# Patient Record
Sex: Male | Born: 1955
Health system: Southern US, Community
[De-identification: ages and names within clinical notes are randomized; demographics above are authoritative.]

## PROBLEM LIST (undated history)

## (undated) DIAGNOSIS — F1910 Other psychoactive substance abuse, uncomplicated: Secondary | ICD-10-CM

## (undated) DIAGNOSIS — B1920 Unspecified viral hepatitis C without hepatic coma: Secondary | ICD-10-CM

## (undated) DIAGNOSIS — K429 Umbilical hernia without obstruction or gangrene: Secondary | ICD-10-CM

## (undated) DIAGNOSIS — K7460 Unspecified cirrhosis of liver: Secondary | ICD-10-CM

## (undated) DIAGNOSIS — T783XXA Angioneurotic edema, initial encounter: Secondary | ICD-10-CM

## (undated) DIAGNOSIS — R188 Other ascites: Secondary | ICD-10-CM

## (undated) DIAGNOSIS — K766 Portal hypertension: Secondary | ICD-10-CM

## (undated) DIAGNOSIS — K4090 Unilateral inguinal hernia, without obstruction or gangrene, not specified as recurrent: Secondary | ICD-10-CM

## (undated) DIAGNOSIS — R772 Abnormality of alphafetoprotein: Secondary | ICD-10-CM

## (undated) HISTORY — DX: Other psychoactive substance abuse, uncomplicated: F19.10

## (undated) HISTORY — DX: Umbilical hernia without obstruction or gangrene: K42.9

## (undated) HISTORY — DX: Portal hypertension: K76.6

## (undated) HISTORY — DX: Unspecified viral hepatitis C without hepatic coma: B19.20

## (undated) HISTORY — DX: Abnormality of alphafetoprotein: R77.2

## (undated) HISTORY — DX: Angioneurotic edema, initial encounter: T78.3XXA

## (undated) HISTORY — DX: Unilateral inguinal hernia, without obstruction or gangrene, not specified as recurrent: K40.90

## (undated) HISTORY — PX: NO PAST SURGERIES: SHX2092

## (undated) HISTORY — DX: Unspecified cirrhosis of liver: K74.60

## (undated) HISTORY — DX: Other ascites: R18.8

---

## 2015-03-12 ENCOUNTER — Encounter (HOSPITAL_COMMUNITY): Payer: Self-pay | Admitting: *Deleted

## 2015-03-12 ENCOUNTER — Emergency Department (HOSPITAL_COMMUNITY)
Admission: EM | Admit: 2015-03-12 | Discharge: 2015-03-12 | Disposition: A | Discharge status: Home / Self Care | Payer: Self-pay | Attending: Emergency Medicine | Admitting: Emergency Medicine

## 2015-03-12 DIAGNOSIS — I776 Arteritis, unspecified: Secondary | ICD-10-CM | POA: Insufficient documentation

## 2015-03-12 LAB — CBC WITH DIFFERENTIAL/PLATELET
Basophils Absolute: 0 10*3/uL (ref 0.0–0.1)
Basophils Relative: 0 %
Eosinophils Absolute: 0 10*3/uL (ref 0.0–0.7)
Eosinophils Relative: 1 %
HCT: 46.7 % (ref 39.0–52.0)
Hemoglobin: 16.4 g/dL (ref 13.0–17.0)
Lymphocytes Relative: 27 %
Lymphs Abs: 2.1 10*3/uL (ref 0.7–4.0)
MCH: 32.2 pg (ref 26.0–34.0)
MCHC: 35.1 g/dL (ref 30.0–36.0)
MCV: 91.6 fL (ref 78.0–100.0)
Monocytes Absolute: 0.6 10*3/uL (ref 0.1–1.0)
Monocytes Relative: 8 %
Neutro Abs: 5.1 10*3/uL (ref 1.7–7.7)
Neutrophils Relative %: 65 %
Platelets: 118 10*3/uL — ABNORMAL LOW (ref 150–400)
RBC: 5.1 MIL/uL (ref 4.22–5.81)
RDW: 13.4 % (ref 11.5–15.5)
WBC: 7.8 10*3/uL (ref 4.0–10.5)

## 2015-03-12 LAB — BASIC METABOLIC PANEL
Anion gap: 13 (ref 5–15)
BUN: 13 mg/dL (ref 6–20)
CALCIUM: 9.6 mg/dL (ref 8.9–10.3)
CO2: 25 mmol/L (ref 22–32)
CREATININE: 1.24 mg/dL (ref 0.61–1.24)
Chloride: 101 mmol/L (ref 101–111)
Glucose, Bld: 120 mg/dL — ABNORMAL HIGH (ref 65–99)
Potassium: 4.7 mmol/L (ref 3.5–5.1)
Sodium: 139 mmol/L (ref 135–145)

## 2015-03-12 LAB — SEDIMENTATION RATE: Sed Rate: 4 mm/hr (ref 0–16)

## 2015-03-12 MED ORDER — PREDNISONE 20 MG PO TABS: 60.0000 mg | ORAL_TABLET | Freq: Every day | ORAL | Status: DC

## 2015-03-12 MED ORDER — PREDNISONE 20 MG PO TABS
60.0000 mg | ORAL_TABLET | Freq: Once | ORAL | Status: AC
  Administered 2015-03-12: 60 mg via ORAL
  Filled 2015-03-12: qty 3

## 2015-03-12 NOTE — ED Notes (Signed)
The pt has ha d arash  From his waist down especially in his groin area  He has had 2 days with itching

## 2015-03-12 NOTE — ED Notes (Signed)
Pt verbalized understanding of d/c instructions and has no further questions. Pt stable and NAD. Pt to follow up with pcp within 3 days. Pt verbalized importance of using prednisone prescription.

## 2015-03-12 NOTE — Discharge Instructions (Signed)
[name],  Take prednisone daily for treatment and see a primary care doctor within 3 days for close follow up.  If any symptoms worsen, come back to the ED immediately. Thank you. Vasculitis is swelling (inflammation) of the blood vessels. With vasculitis, the blood vessels can become thick, narrow, scarred, or weak, and enough blood may not be able to flow through them. This can cause damage to the muscles, kidneys, lungs, brain, and other parts of the body.  There are many types of vasculitis. Some last only a short time while others last a long time. CAUSES  The exact cause is unknown, but vasculitis can develop when the body's immune system attacks its own blood vessels. This attack can be caused by:  An infection.  An immune system disease, such as lupus, rheumatoid arthritis, or scleroderma.  An allergic reaction to a medicine.  Cancer that affects blood cells, such as leukemia and lymphoma. RISK FACTORS  Being a smoker.  Being under stress.  Having a physical injury. SIGNS AND SYMPTOMS  Symptoms vary depending on the type of vasculitis you have. Symptoms that are common to all types of vasculitis include:  Fever.  Poor appetite.  Weight loss.  Feeling very tired.  Having aches and pains.  Weakness.  Numbness in an area of your body. Symptoms for specific types of vasculitis include:  Skin problems, such as sores, spots, or rashes.  Trouble seeing.  Trouble breathing.  Blood in your urine.  Headaches.  Stomach pain.  Stuffy or bloody nose. DIAGNOSIS  Your health care provider will ask about your symptoms and do a physical exam. You may have tests done, such as:  A complete blood count (CBC).  Erythrocyte sedimentation, also called sed rate test.  C-reactive protein (CRP).  Antineutrophil cytoplasmic antibodies (ANCA).  A urine test.  A biopsy of a blood vessel.  A nerve conduction study.  Imaging tests, such as:  X-rays.  A  CT scan.  An ultrasound.  An MRI.  Angiography. TREATMENT  Treatment will depend on the type of vasculitis you have and how severe it is. Sometimes treatment is not needed. Treatment often includes:  Medicines.  Physical therapy or occupational therapy. This helps strengthen muscles that were weakened by the disease. You will need to see your health care provider while you are being treated. During follow-up visits, your health care provider will:  Perform blood tests and bone density tests.  Check your blood pressure and blood sugar.  Check for side effects of any medicines you are taking. Vasculitis cannot always be cured. Sometimes symptoms go away but the disease does not (the disease goes in remission). If symptoms return, increased treatment may be needed. HOME CARE INSTRUCTIONS   Take medicines only as directed by your health care provider.  Keep all follow-up visits as directed by your health care provider. This is important.  Exercise. Talk with your health care provider about what exercises are okay for you to do. Usually exercises that increase your heart rate (aerobic exercise), such as walking, are recommended. Aerobic exercise helps control your blood pressure and prevent bone loss.  Follow a healthy diet. Include healthy sources of protein, fruits, vegetables, and whole grains in your diet.  Learn as much as you can about vasculitis, and consider joining a support group. Understanding your condition and talking with others who have it may help you cope. Talk with your health care provider if you feel stressed, anxious, or depressed. [place]  IF:   Your symptoms return, or you have new symptoms.  Your fever, fatigue, headache, or weight loss gets worse.  You have signs of infection, such as fever, warmth, tenderness, redness, or swelling. SEEK IMMEDIATE MEDICAL CARE IF:   Your vision gets worse.  Your pain does not go away, even after you take pain  medicine.  You have chest or stomach pain.  You have trouble breathing.  One side of your face or body suddenly becomes weak or numb.  Your nose bleeds.  There is blood in your urine. MAKE SURE YOU:  Understand these instructions.  Will watch your condition.  Will get help right away if you are not doing well or get worse.   This information is not intended to replace advice given to you by your health care provider. Make sure you discuss any questions you have with your health care provider.   Document Released: 11/18/2008 Document Revised: 02/12/2014 Document Reviewed: 03/18/2013 Elsevier Interactive Patient Education [employer].

## 2015-03-12 NOTE — ED Provider Notes (Signed)
CSN: [id number]     Arrival date & time 03/12/15  0019 History  By signing my name below, I, Richard Yang, attest that this documentation has been prepared under the direction and in the presence of Everlene Balls, MD. Electronically Signed: Judithann Sauger, ED Scribe. 03/12/2015. 3:52 AM.    Chief Complaint  Patient presents with  . Rash   The history is provided by the patient. No language interpreter was used.   HPI Comments: Richard Yang is a 60 y.o. male who presents to the Emergency Department complaining of gradually spreading mildy itchy rash that started in his left groin and now to his bilateral elbows onset 3 days ago. He explains that the only thing out of the ordinary 3 days ago was drinking Sake. No alleviating factors noted. Pt has not taken any medications. He also denies any new medications, lotions, or detergents. No fever, chills, facial swelling, or throat/tongue swelling.   History reviewed. No pertinent past medical history. History reviewed. No pertinent past surgical history. No family history on file. Social History  Substance Use Topics  . Smoking status: Never Smoker   . Smokeless tobacco: None  . Alcohol Use: Yes    Review of Systems  Constitutional: Negative for fever and chills.  HENT: Negative for facial swelling and trouble swallowing.   Skin: Positive for rash.  All other systems reviewed and are negative.     Allergies  Review of patient's allergies indicates no known allergies.  Home Medications   Prior to Admission medications   Not on File   BP 154/103 mmHg  Pulse 101  Temp(Src) 99 F (37.2 C)  Resp 18  Ht 5\' 10"  (1.778 m)  Wt 223 lb 6 oz (101.322 kg)  BMI 32.05 kg/m2  SpO2 97% Physical Exam  Constitutional: He is oriented to person, place, and time. Vital signs are normal. He appears well-developed and well-nourished.  Non-toxic appearance. He does not appear ill. No distress.  HENT:  Head: Normocephalic and atraumatic.  Nose:  Nose normal.  Mouth/Throat: Oropharynx is clear and moist. No oropharyngeal exudate.  Eyes: Conjunctivae and EOM are normal. Pupils are equal, round, and reactive to light. No scleral icterus.  Neck: Normal range of motion. Neck supple. No tracheal deviation, no edema, no erythema and normal range of motion present. No thyroid mass and no thyromegaly present.  Cardiovascular: Normal rate, regular rhythm, S1 normal, S2 normal, normal heart sounds, intact distal pulses and normal pulses.  Exam reveals no gallop and no friction rub.   No murmur heard. Pulmonary/Chest: Effort normal and breath sounds normal. No respiratory distress. He has no wheezes. He has no rhonchi. He has no rales.  Abdominal: Soft. Normal appearance and bowel sounds are normal. He exhibits no distension, no ascites and no mass. There is no hepatosplenomegaly. There is no tenderness. There is no rebound, no guarding and no CVA tenderness.  Musculoskeletal: Normal range of motion. He exhibits no edema or tenderness.  Lymphadenopathy:    He has no cervical adenopathy.  Neurological: He is alert and oriented to person, place, and time. He has normal strength. No cranial nerve deficit or sensory deficit.  Skin: Skin is warm and intact. Rash noted. No petechiae noted. He is not diaphoretic. There is erythema. No pallor.  Erythematous, non-blanching, slightly warm rash to his groin bilateral thighs, and bilateral forearms: No TTP, no drainage  Psychiatric: He has a normal mood and affect. His behavior is normal. Judgment normal.  Nursing note and vitals  reviewed.   ED Course  Procedures (including critical care time) DIAGNOSTIC STUDIES: Oxygen Saturation is 97%, RA,  normal by my interpretation.    COORDINATION OF CARE:   3:17 AM- Pt advised of plan for treatment and pt agrees. Pt will receive prednisone and lab work for further evaluation.   Labs Review Labs Reviewed  CBC WITH DIFFERENTIAL/PLATELET - Abnormal; Notable for  the following:    Platelets 118 (*)    All other components within normal limits  BASIC METABOLIC PANEL - Abnormal; Notable for the following:    Glucose, Bld 120 (*)    All other components within normal limits  SEDIMENTATION RATE  C-REACTIVE PROTEIN     Everlene Balls, MD has personally reviewed and evaluated these lab results as part of his medical decision-making.  MDM   Final diagnoses:  None   Patient's rash is consistent with a vasculitis. He was given prednisone in the emergency department, will obtain laboratory studies to assess for any systemic findings. He continues to appear well and has no complaints of pain or pruritus.  Labs reveal thrombocytopenia.  He has no history of bleeding. He was given prednisone in the emergency department and will be discharged with a one-week course. Patient encouraged to follow with primary care physician within 3 days. He demonstrated good understanding. Strict return precautions given. He appears well and in no acute distress, vital signs were within his normal limits and he is safe for discharge.   I personally performed the services described in this documentation, which was scribed in my presence. The recorded information has been reviewed and is accurate.     Everlene Balls, MD 03/12/15 [phone]

## 2015-03-15 ENCOUNTER — Ambulatory Visit: Payer: Self-pay | Attending: Family Medicine | Admitting: Physician Assistant

## 2015-03-15 VITALS — BP 150/88 | HR 85 | Temp 98.3°F | Resp 16 | Ht 68.0 in | Wt 228.0 lb

## 2015-03-15 DIAGNOSIS — R21 Rash and other nonspecific skin eruption: Secondary | ICD-10-CM | POA: Insufficient documentation

## 2015-03-15 DIAGNOSIS — Z0001 Encounter for general adult medical examination with abnormal findings: Secondary | ICD-10-CM | POA: Insufficient documentation

## 2015-03-15 DIAGNOSIS — R030 Elevated blood-pressure reading, without diagnosis of hypertension: Secondary | ICD-10-CM | POA: Insufficient documentation

## 2015-03-15 DIAGNOSIS — I776 Arteritis, unspecified: Secondary | ICD-10-CM | POA: Insufficient documentation

## 2015-03-15 DIAGNOSIS — Z131 Encounter for screening for diabetes mellitus: Secondary | ICD-10-CM

## 2015-03-15 DIAGNOSIS — D696 Thrombocytopenia, unspecified: Secondary | ICD-10-CM | POA: Insufficient documentation

## 2015-03-15 LAB — POCT GLYCOSYLATED HEMOGLOBIN (HGB A1C): Hemoglobin A1C: 5.6

## 2015-03-15 LAB — GLUCOSE, POCT (MANUAL RESULT ENTRY): POC Glucose: 137 mg/dl — AB (ref 70–99)

## 2015-03-15 NOTE — Progress Notes (Signed)
Patient's here for ED f/up for vasculitis. Patient denies pain, but states he's dealing with a lot of itchiness.  Patient would like to talk with provider about Hep B vaccination to discuss details of preventative care.  Patient also here to est. care with PCP.  Patient agrees A1c screening, but decline flu shot.

## 2015-03-15 NOTE — Progress Notes (Addendum)
   [name]  [id number]  [id number]  DOB - Sep 05, 1955  Chief Complaint  Patient presents with  . Follow-up  . Establish Care       Subjective:   Richard Yang is a 60 y.o. male here today for establishment of care. He has no PMH other than remote polysubstance abuse. He presented to the Emergency Department on 03/12/15 complaining of a gradually spreading mildy itchy rash that started in his left groin and now to his bilateral elbows and bilateral groin, onset 3 days ago. He explains that the only thing out of the ordinary 3 days ago was drinking Sake. No alleviating factors noted. Pt has not taken any medications. He also denies any new medications, lotions, or detergents. No fever, chills, facial swelling, or throat/tongue swelling.  No recent virus. No fam hx rheumatologic conditions. No allergies. No bleeding. No bruising.  ROS: GEN: denies fever or chills, denies change in weight Skin: + lesions or rashes HEENT: denies headache, earache, epistaxis, sore throat, or neck pain LUNGS: denies SHOB, dyspnea, PND, orthopnea CV: denies CP or palpitations EXT: denies muscle spasms or swelling; no pain in lower ext, no weakness  Problem  Vasculitis (Hcc)    ALLERGIES: No Known Allergies  PAST MEDICAL HISTORY: History reviewed. No pertinent past medical history.  Polysubstance abuse remotely  PAST SURGICAL HISTORY: History reviewed. No pertinent past surgical history.  No operations  MEDICATIONS AT HOME: Prior to Admission medications   Medication Sig Start Date End Date Taking? Authorizing Provider  predniSONE (DELTASONE) 20 MG tablet Take 3 tablets (60 mg total) by mouth daily. 03/12/15   Everlene Balls, MD     Objective:   Filed Vitals:   03/15/15 1400  BP: 150/88  Pulse: 85  Temp: 98.3 F (36.8 C)  TempSrc: Oral  Resp: 16  Height: 5\' 8"  (1.727 m)  Weight: 228 lb (103.42 kg)  SpO2: 95%    Exam General appearance : Awake, alert, not in any distress. Speech  Clear. Not toxic looking. Neck: supple, no JVD. No cervical lymphadenopathy.  Chest:Good air entry bilaterally, no added sounds  CVS: S1 S2 regular, no murmurs.  Extremities: B/L Lower Ext shows no edema, both legs are warm to touch Skin:purplish macula papular rash of bilateral groin and lower leg L>R   Data Review Lab Results  Component Value Date   HGBA1C 5.60 03/15/2015     Assessment & Plan  1. Vasculitis  -Cont course of Prednisone  -Consider rheum referral   2. Thrombocytopenia  -repeat PLT count in 1 week   -Complete steroids  3. Elevated BP  -keep a log and bring to next appt  -DASH diet  Return in about 1 week (around 03/22/2015). At this time can consider repeating PLT count and the possible need for Rheum referral. He also would like to discuss hep B vaccination at this time.   The patient was given clear instructions to go to ER or return to medical center if symptoms don't improve, worsen or new problems develop. The patient verbalized understanding. The patient was told to call to get lab results if they haven't heard anything in the next week.   This note has been created with Surveyor, quantity. Any transcriptional errors are unintentional.    Zettie Pho, PA-C [facility] and [facility], [place]   03/15/2015, 2:33 PM

## 2015-03-23 ENCOUNTER — Ambulatory Visit: Payer: Self-pay | Attending: Internal Medicine | Admitting: Internal Medicine

## 2015-03-23 ENCOUNTER — Encounter: Payer: Self-pay | Admitting: Internal Medicine

## 2015-03-23 VITALS — BP 141/91 | HR 83 | Temp 98.0°F | Resp 16 | Ht 69.0 in | Wt 220.0 lb

## 2015-03-23 DIAGNOSIS — Z79899 Other long term (current) drug therapy: Secondary | ICD-10-CM | POA: Insufficient documentation

## 2015-03-23 DIAGNOSIS — I776 Arteritis, unspecified: Secondary | ICD-10-CM | POA: Insufficient documentation

## 2015-03-23 DIAGNOSIS — D696 Thrombocytopenia, unspecified: Secondary | ICD-10-CM | POA: Insufficient documentation

## 2015-03-23 LAB — CBC WITH DIFFERENTIAL/PLATELET
BASOS PCT: 0 % (ref 0–1)
Basophils Absolute: 0 10*3/uL (ref 0.0–0.1)
EOS ABS: 0.2 10*3/uL (ref 0.0–0.7)
Eosinophils Relative: 4 % (ref 0–5)
HCT: 48.7 % (ref 39.0–52.0)
HEMOGLOBIN: 16.4 g/dL (ref 13.0–17.0)
LYMPHS ABS: 2.4 10*3/uL (ref 0.7–4.0)
Lymphocytes Relative: 47 % — ABNORMAL HIGH (ref 12–46)
MCH: 30.8 pg (ref 26.0–34.0)
MCHC: 33.7 g/dL (ref 30.0–36.0)
MCV: 91.5 fL (ref 78.0–100.0)
MONO ABS: 1.1 10*3/uL — AB (ref 0.1–1.0)
MONOS PCT: 21 % — AB (ref 3–12)
MPV: 11.9 fL (ref 8.6–12.4)
NEUTROS ABS: 1.4 10*3/uL — AB (ref 1.7–7.7)
Neutrophils Relative %: 28 % — ABNORMAL LOW (ref 43–77)
Platelets: 109 10*3/uL — ABNORMAL LOW (ref 150–400)
RBC: 5.32 MIL/uL (ref 4.22–5.81)
RDW: 14.2 % (ref 11.5–15.5)
WBC: 5.1 10*3/uL (ref 4.0–10.5)

## 2015-03-23 NOTE — Progress Notes (Signed)
Patient ID: Richard Yang, male   DOB: 1955/02/09, 60 y.o.   MRN: [id number]  CC: follow up  HPI: Richard Yang is a 60 y.o. male here today for a follow up visit.  Patient has past medical history of vasculitis. Patient was evaluated in ED and here as a walk in for symptoms of vasculitis. He was given a course of steroids and told to follow up here today to establish care with a PCP. He has since completed steroids but still has complaints of mild itching on right inner thigh. He reports resolution of area with improvement of color.    No Known Allergies History reviewed. No pertinent past medical history. Current Outpatient Prescriptions on File Prior to Visit  Medication Sig Dispense Refill  . predniSONE (DELTASONE) 20 MG tablet Take 3 tablets (60 mg total) by mouth daily. 15 tablet 0   No current facility-administered medications on file prior to visit.   History reviewed. No pertinent family history. Social History   Social History  . Marital Status: Single    Spouse Name: N/A  . Number of Children: N/A  . Years of Education: N/A   Occupational History  . Not on file.   Social History Main Topics  . Smoking status: Never Smoker   . Smokeless tobacco: Not on file  . Alcohol Use: Yes  . Drug Use: Not on file  . Sexual Activity: Not on file   Other Topics Concern  . Not on file   Social History Narrative    Review of Systems: Other than what is stated in HPI, all other systems are negative.   Objective:   Filed Vitals:   03/23/15 1440  BP: 141/91  Pulse: 83  Temp: 98 F (36.7 C)  Resp: 16    Physical Exam  Constitutional: He is oriented to person, place, and time.  Cardiovascular: Normal rate, regular rhythm and normal heart sounds.   Pulmonary/Chest: Effort normal and breath sounds normal.  Neurological: He is alert and oriented to person, place, and time.  Skin: Skin is warm and dry.  Hyperpigmentation of bilateral inner thighs   Psychiatric: He has a normal  mood and affect.     Lab Results  Component Value Date   WBC 7.8 03/12/2015   HGB 16.4 03/12/2015   HCT 46.7 03/12/2015   MCV 91.6 03/12/2015   PLT 118* 03/12/2015   Lab Results  Component Value Date   CREATININE 1.24 03/12/2015   BUN 13 03/12/2015   NA 139 03/12/2015   K 4.7 03/12/2015   CL 101 03/12/2015   CO2 25 03/12/2015    Lab Results  Component Value Date   HGBA1C 5.60 03/15/2015   Lipid Panel  No results found for: CHOL, TRIG, HDL, CHOLHDL, VLDL, LDLCALC     Assessment and plan:   Richard Yang was seen today for follow-up.  Diagnoses and all orders for this visit:  Vasculitis ([place]) Resolved. May get OTC hydrocortisone cream for itch  Thrombocytopenia (HCC) -     CBC with Differential  Will recheck today. Again advised against NSAID's   Return if symptoms worsen or fail to improve.      Lance Bosch, [facility] [phone] 03/23/2015, 3:04 PM

## 2015-03-23 NOTE — Patient Instructions (Signed)
DASH Eating Plan  DASH stands for "Dietary Approaches to Stop Hypertension." The DASH eating plan is a healthy eating plan that has been shown to reduce high blood pressure (hypertension). Additional health benefits may include reducing the risk of type 2 diabetes mellitus, heart disease, and stroke. The DASH eating plan may also help with weight loss.  WHAT DO I NEED TO KNOW ABOUT THE DASH EATING PLAN?  For the DASH eating plan, you will follow these general guidelines:  · Choose foods with a percent daily value for sodium of less than 5% (as listed on the food label).  · Use salt-free seasonings or herbs instead of table salt or sea salt.  · Check with your health care provider or pharmacist before using salt substitutes.  · Eat lower-sodium products, often labeled as "lower sodium" or "no salt added."  · Eat fresh foods.  · Eat more vegetables, fruits, and low-fat dairy products.  · Choose whole grains. Look for the word "whole" as the first word in the ingredient list.  · Choose fish and skinless chicken or turkey more often than red meat. Limit fish, poultry, and meat to 6 oz (170 g) each day.  · Limit sweets, desserts, sugars, and sugary drinks.  · Choose heart-healthy fats.  · Limit cheese to 1 oz (28 g) per day.  · Eat more home-cooked food and less restaurant, buffet, and fast food.  · Limit fried foods.  · Cook foods using methods other than frying.  · Limit canned vegetables. If you do use them, rinse them well to decrease the sodium.  · When eating at a restaurant, ask that your food be prepared with less salt, or no salt if possible.  WHAT FOODS CAN I EAT?  Seek help from a dietitian for individual calorie needs.  Grains  Whole grain or whole wheat bread. Brown rice. Whole grain or whole wheat pasta. Quinoa, bulgur, and whole grain cereals. Low-sodium cereals. Corn or whole wheat flour tortillas. Whole grain cornbread. Whole grain crackers. Low-sodium crackers.  Vegetables  Fresh or frozen vegetables  (raw, steamed, roasted, or grilled). Low-sodium or reduced-sodium tomato and vegetable juices. Low-sodium or reduced-sodium tomato sauce and paste. Low-sodium or reduced-sodium canned vegetables.   Fruits  All fresh, canned (in natural juice), or frozen fruits.  Meat and Other Protein Products  Ground beef (85% or leaner), grass-fed beef, or beef trimmed of fat. Skinless chicken or turkey. Ground chicken or turkey. Pork trimmed of fat. All fish and seafood. Eggs. Dried beans, peas, or lentils. Unsalted nuts and seeds. Unsalted canned beans.  Dairy  Low-fat dairy products, such as skim or 1% milk, 2% or reduced-fat cheeses, low-fat ricotta or cottage cheese, or plain low-fat yogurt. Low-sodium or reduced-sodium cheeses.  Fats and Oils  Tub margarines without trans fats. Light or reduced-fat mayonnaise and salad dressings (reduced sodium). Avocado. Safflower, olive, or canola oils. Natural peanut or almond butter.  Other  Unsalted popcorn and pretzels.  The items listed above may not be a complete list of recommended foods or beverages. Contact your dietitian for more options.  WHAT FOODS ARE NOT RECOMMENDED?  Grains  White bread. White pasta. White rice. Refined cornbread. Bagels and croissants. Crackers that contain trans fat.  Vegetables  Creamed or fried vegetables. Vegetables in a cheese sauce. Regular canned vegetables. Regular canned tomato sauce and paste. Regular tomato and vegetable juices.  Fruits  Dried fruits. Canned fruit in light or heavy syrup. Fruit juice.  Meat and Other Protein   Products  Fatty cuts of meat. Ribs, chicken wings, bacon, sausage, bologna, salami, chitterlings, fatback, hot dogs, bratwurst, and packaged luncheon meats. Salted nuts and seeds. Canned beans with salt.  Dairy  Whole or 2% milk, cream, half-and-half, and cream cheese. Whole-fat or sweetened yogurt. Full-fat cheeses or blue cheese. Nondairy creamers and whipped toppings. Processed cheese, cheese spreads, or cheese  curds.  Condiments  Onion and garlic salt, seasoned salt, table salt, and sea salt. Canned and packaged gravies. Worcestershire sauce. Tartar sauce. Barbecue sauce. Teriyaki sauce. Soy sauce, including reduced sodium. Steak sauce. Fish sauce. Oyster sauce. Cocktail sauce. Horseradish. Ketchup and mustard. Meat flavorings and tenderizers. Bouillon cubes. Hot sauce. Tabasco sauce. Marinades. Taco seasonings. Relishes.  Fats and Oils  Butter, stick margarine, lard, shortening, ghee, and bacon fat. Coconut, palm kernel, or palm oils. Regular salad dressings.  Other  Pickles and olives. Salted popcorn and pretzels.  The items listed above may not be a complete list of foods and beverages to avoid. Contact your dietitian for more information.  WHERE CAN I FIND MORE INFORMATION?  National Heart, Lung, and [employer] Institute: www.nhlbi.nih.gov/health/health-topics/topics/dash/     This information is not intended to replace advice given to you by your health care provider. Make sure you discuss any questions you have with your health care provider.     Document Released: 01/11/2011 Document Revised: 02/12/2014 Document Reviewed: 11/26/2012  Elsevier Interactive Patient Education ©2016 [employer]

## 2015-03-23 NOTE — Progress Notes (Signed)
Patient here for follow up on his vasculitis Patient complains of a slight itch to his upper right thigh

## 2015-04-11 ENCOUNTER — Telehealth: Payer: Self-pay

## 2015-04-11 DIAGNOSIS — R799 Abnormal finding of blood chemistry, unspecified: Secondary | ICD-10-CM

## 2015-04-11 NOTE — Telephone Encounter (Signed)
-----   Message from Lance Bosch, NP sent at 04/11/2015 12:53 PM EST ----- Patient's blood count is off. I would like him to come in for additional test such as HIV and RPR. Please place future orders

## 2015-04-11 NOTE — Telephone Encounter (Signed)
Spoke with patient and he is aware of his lab results Patient will come back in this Thursday for additional blood work

## 2015-04-14 ENCOUNTER — Ambulatory Visit: Payer: Self-pay | Attending: Internal Medicine

## 2015-04-14 DIAGNOSIS — R799 Abnormal finding of blood chemistry, unspecified: Secondary | ICD-10-CM | POA: Insufficient documentation

## 2015-04-14 NOTE — Patient Instructions (Signed)
Patient is aware of receiving FU call with results. 

## 2015-04-15 LAB — HIV ANTIBODY (ROUTINE TESTING W REFLEX): HIV 1&2 Ab, 4th Generation: NONREACTIVE

## 2015-04-15 LAB — RPR

## 2015-04-19 ENCOUNTER — Telehealth: Payer: Self-pay

## 2015-04-19 NOTE — Telephone Encounter (Signed)
-----   Message from Lance Bosch, NP sent at 04/18/2015 10:01 PM EDT ----- Everything is negative!!

## 2015-04-19 NOTE — Telephone Encounter (Signed)
Spoke with patient and he is aware all of his results were negative

## 2015-11-18 ENCOUNTER — Emergency Department (HOSPITAL_COMMUNITY)
Admission: EM | Admit: 2015-11-18 | Discharge: 2015-11-18 | Disposition: A | Discharge status: Home / Self Care | Payer: No Typology Code available for payment source | Attending: Emergency Medicine | Admitting: Emergency Medicine

## 2015-11-18 ENCOUNTER — Encounter (HOSPITAL_COMMUNITY): Payer: Self-pay

## 2015-11-18 ENCOUNTER — Emergency Department (HOSPITAL_COMMUNITY): Payer: No Typology Code available for payment source

## 2015-11-18 DIAGNOSIS — Y939 Activity, unspecified: Secondary | ICD-10-CM | POA: Insufficient documentation

## 2015-11-18 DIAGNOSIS — Y999 Unspecified external cause status: Secondary | ICD-10-CM | POA: Diagnosis not present

## 2015-11-18 DIAGNOSIS — Y92410 Unspecified street and highway as the place of occurrence of the external cause: Secondary | ICD-10-CM | POA: Diagnosis not present

## 2015-11-18 DIAGNOSIS — S161XXA Strain of muscle, fascia and tendon at neck level, initial encounter: Secondary | ICD-10-CM | POA: Insufficient documentation

## 2015-11-18 DIAGNOSIS — R030 Elevated blood-pressure reading, without diagnosis of hypertension: Secondary | ICD-10-CM

## 2015-11-18 DIAGNOSIS — S199XXA Unspecified injury of neck, initial encounter: Secondary | ICD-10-CM | POA: Diagnosis present

## 2015-11-18 LAB — URINALYSIS, ROUTINE W REFLEX MICROSCOPIC
BILIRUBIN URINE: NEGATIVE
GLUCOSE, UA: NEGATIVE mg/dL
HGB URINE DIPSTICK: NEGATIVE
Ketones, ur: NEGATIVE mg/dL
Leukocytes, UA: NEGATIVE
Nitrite: NEGATIVE
PROTEIN: NEGATIVE mg/dL
SPECIFIC GRAVITY, URINE: 1.014 (ref 1.005–1.030)
pH: 6 (ref 5.0–8.0)

## 2015-11-18 LAB — CBC WITH DIFFERENTIAL/PLATELET
Basophils Absolute: 0 10*3/uL (ref 0.0–0.1)
Basophils Relative: 0 %
EOS ABS: 0.1 10*3/uL (ref 0.0–0.7)
EOS PCT: 2 %
HCT: 44.9 % (ref 39.0–52.0)
Hemoglobin: 15.2 g/dL (ref 13.0–17.0)
LYMPHS ABS: 1.7 10*3/uL (ref 0.7–4.0)
LYMPHS PCT: 38 %
MCH: 31.5 pg (ref 26.0–34.0)
MCHC: 33.9 g/dL (ref 30.0–36.0)
MCV: 93.2 fL (ref 78.0–100.0)
MONO ABS: 0.5 10*3/uL (ref 0.1–1.0)
MONOS PCT: 11 %
Neutro Abs: 2.3 10*3/uL (ref 1.7–7.7)
Neutrophils Relative %: 50 %
PLATELETS: 100 10*3/uL — AB (ref 150–400)
RBC: 4.82 MIL/uL (ref 4.22–5.81)
RDW: 13.9 % (ref 11.5–15.5)
WBC: 4.5 10*3/uL (ref 4.0–10.5)

## 2015-11-18 LAB — BASIC METABOLIC PANEL
Anion gap: 8 (ref 5–15)
BUN: 12 mg/dL (ref 6–20)
CO2: 25 mmol/L (ref 22–32)
CREATININE: 1.11 mg/dL (ref 0.61–1.24)
Calcium: 9 mg/dL (ref 8.9–10.3)
Chloride: 105 mmol/L (ref 101–111)
GFR calc Af Amer: >60 mL/min (ref >60)
GLUCOSE: 113 mg/dL — AB (ref 65–99)
POTASSIUM: 3.9 mmol/L (ref 3.5–5.1)
SODIUM: 138 mmol/L (ref 135–145)

## 2015-11-18 MED ORDER — HYDROCHLOROTHIAZIDE 12.5 MG PO TABS: 12.5000 mg | ORAL_TABLET | Freq: Every day | ORAL | 1 refills | Status: DC

## 2015-11-18 MED ORDER — HYDROMORPHONE HCL 1 MG/ML IJ SOLN
0.5000 mg | Freq: Once | INTRAMUSCULAR | Status: AC
  Administered 2015-11-18: 0.5 mg via INTRAMUSCULAR
  Filled 2015-11-18: qty 1

## 2015-11-18 MED ORDER — DICLOFENAC 35 MG PO CAPS: 1.0000 | ORAL_CAPSULE | Freq: Three times a day (TID) | ORAL | 0 refills | Status: DC | PRN

## 2015-11-18 MED ORDER — ONDANSETRON 4 MG PO TBDP
4.0000 mg | ORAL_TABLET | Freq: Once | ORAL | Status: AC
  Administered 2015-11-18: 4 mg via ORAL
  Filled 2015-11-18: qty 1

## 2015-11-18 MED ORDER — METHOCARBAMOL 500 MG PO TABS: 1000.0000 mg | ORAL_TABLET | Freq: Four times a day (QID) | ORAL | 0 refills | Status: DC | PRN

## 2015-11-18 MED ORDER — ONDANSETRON HCL 4 MG/2ML IJ SOLN: 4.0000 mg | Freq: Once | INTRAMUSCULAR | Status: DC

## 2015-11-18 MED ORDER — METHOCARBAMOL 500 MG PO TABS
1000.0000 mg | ORAL_TABLET | Freq: Once | ORAL | Status: AC
  Administered 2015-11-18: 1000 mg via ORAL
  Filled 2015-11-18: qty 2

## 2015-11-18 MED ORDER — IBUPROFEN 400 MG PO TABS
400.0000 mg | ORAL_TABLET | Freq: Once | ORAL | Status: AC
  Administered 2015-11-18: 400 mg via ORAL
  Filled 2015-11-18: qty 1

## 2015-11-18 MED ORDER — HYDROMORPHONE HCL 1 MG/ML IJ SOLN: 0.5000 mg | Freq: Once | INTRAMUSCULAR | Status: DC

## 2015-11-18 MED ORDER — HYDROCODONE-ACETAMINOPHEN 5-325 MG PO TABS
1.0000 | ORAL_TABLET | Freq: Once | ORAL | Status: AC
  Administered 2015-11-18: 1 via ORAL
  Filled 2015-11-18: qty 1

## 2015-11-18 NOTE — ED Notes (Signed)
Pt transported back to room from CT scanner on stretcher with tech, tolerated well.

## 2015-11-18 NOTE — Progress Notes (Signed)
Orthopedic Tech Progress Note Patient Details:  Richard Yang 03/22/1955 [id number]  Ortho Devices Type of Ortho Device: Soft collar Ortho Device/Splint Location: Neck Ortho Device/Splint Interventions: Application   [name] 11/18/2015, 2:47 PM
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
## 2015-11-18 NOTE — Discharge Instructions (Signed)
Please follow with your primary care doctor in the next 5 days for high blood pressure evaluation. If you do not have a primary care doctor, present to urgent care. Reduce salt intake. Seek emergency medical care for unilateral weakness, slurring, change in vision, or chest pain and shortness of breath.  Please follow with your primary care doctor in the next 2 days for a check-up. They must obtain records for further management.   Do not hesitate to return to the Emergency Department for any new, worsening or concerning symptoms.   For pain control you may take up to 800mg  of Motrin (also known as ibuprofen). That is usually 4 over the counter pills,  3 times a day. Take with food to minimize stomach irritation.   For breakthrough pain you may take Robaxin. Do not drink alcohol, drive or operate heavy machinery when taking Robaxin.

## 2015-11-18 NOTE — ED Notes (Signed)
Patient transported to CT on stretcher with tech. 

## 2015-11-18 NOTE — ED Notes (Signed)
Pt transported to and from MRI on stretcher with tech, tolerated well.
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
## 2015-11-30 ENCOUNTER — Inpatient Hospital Stay: Payer: Self-pay

## 2015-12-16 ENCOUNTER — Ambulatory Visit: Payer: Self-pay | Attending: Internal Medicine | Admitting: Physician Assistant

## 2015-12-16 VITALS — BP 146/83 | HR 85 | Temp 97.7°F | Resp 16 | Wt 226.6 lb

## 2015-12-16 DIAGNOSIS — D696 Thrombocytopenia, unspecified: Secondary | ICD-10-CM | POA: Insufficient documentation

## 2015-12-16 DIAGNOSIS — I10 Essential (primary) hypertension: Secondary | ICD-10-CM | POA: Insufficient documentation

## 2015-12-16 DIAGNOSIS — R739 Hyperglycemia, unspecified: Secondary | ICD-10-CM | POA: Insufficient documentation

## 2015-12-16 DIAGNOSIS — Z79899 Other long term (current) drug therapy: Secondary | ICD-10-CM | POA: Insufficient documentation

## 2015-12-16 LAB — CBC WITH DIFFERENTIAL/PLATELET
BASOS ABS: 0 {cells}/uL (ref 0–200)
Basophils Relative: 0 %
Eosinophils Absolute: 51 cells/uL (ref 15–500)
Eosinophils Relative: 1 %
HEMATOCRIT: 47.6 % (ref 38.5–50.0)
HEMOGLOBIN: 16.1 g/dL (ref 13.2–17.1)
LYMPHS ABS: 1887 {cells}/uL (ref 850–3900)
LYMPHS PCT: 37 %
MCH: 32.1 pg (ref 27.0–33.0)
MCHC: 33.8 g/dL (ref 32.0–36.0)
MCV: 94.8 fL (ref 80.0–100.0)
MONO ABS: 561 {cells}/uL (ref 200–950)
MPV: 11.9 fL (ref 7.5–12.5)
Monocytes Relative: 11 %
NEUTROS PCT: 51 %
Neutro Abs: 2601 cells/uL (ref 1500–7800)
Platelets: 103 10*3/uL — ABNORMAL LOW (ref 140–400)
RBC: 5.02 MIL/uL (ref 4.20–5.80)
RDW: 14.4 % (ref 11.0–15.0)
WBC: 5.1 10*3/uL (ref 3.8–10.8)

## 2015-12-16 LAB — BASIC METABOLIC PANEL
BUN: 21 mg/dL (ref 7–25)
CALCIUM: 9.3 mg/dL (ref 8.6–10.3)
CO2: 25 mmol/L (ref 20–31)
CREATININE: 1.19 mg/dL (ref 0.70–1.25)
Chloride: 105 mmol/L (ref 98–110)
GLUCOSE: 89 mg/dL (ref 65–99)
POTASSIUM: 3.9 mmol/L (ref 3.5–5.3)
Sodium: 139 mmol/L (ref 135–146)

## 2015-12-16 LAB — POCT GLYCOSYLATED HEMOGLOBIN (HGB A1C): Hemoglobin A1C: 5.5

## 2015-12-16 MED ORDER — HYDROCHLOROTHIAZIDE 12.5 MG PO TABS: 12.5000 mg | ORAL_TABLET | Freq: Every day | ORAL | 1 refills | Status: DC

## 2015-12-16 NOTE — Patient Instructions (Addendum)
Check BP 3-5 times weekly and record  Hypertension Hypertension, commonly called high blood pressure, is when the force of blood pumping through your arteries is too strong. Your arteries are the blood vessels that carry blood from your heart throughout your body. A blood pressure reading consists of a higher number over a lower number, such as 110/72. The higher number (systolic) is the pressure inside your arteries when your heart pumps. The lower number (diastolic) is the pressure inside your arteries when your heart relaxes. Ideally you want your blood pressure below 120/80. Hypertension forces your heart to work harder to pump blood. Your arteries may become narrow or stiff. Having untreated or uncontrolled hypertension can cause heart attack, stroke, kidney disease, and other problems. RISK FACTORS Some risk factors for high blood pressure are controllable. Others are not.  Risk factors you cannot control include:   Race. You may be at higher risk if you are African American.  Age. Risk increases with age.  Gender. Men are at higher risk than women before age 68 years. After age 61, women are at higher risk than men. Risk factors you can control include:  Not getting enough exercise or physical activity.  Being overweight.  Getting too much fat, sugar, calories, or salt in your diet.  Drinking too much alcohol. SIGNS AND SYMPTOMS Hypertension does not usually cause signs or symptoms. Extremely high blood pressure (hypertensive crisis) may cause headache, anxiety, shortness of breath, and nosebleed. DIAGNOSIS To check if you have hypertension, your health care provider will measure your blood pressure while you are seated, with your arm held at the level of your heart. It should be measured at least twice using the same arm. Certain conditions can cause a difference in blood pressure between your right and left arms. A blood pressure reading that is higher than normal on one occasion  does not mean that you need treatment. If it is not clear whether you have high blood pressure, you may be asked to return on a different day to have your blood pressure checked again. Or, you may be asked to monitor your blood pressure at home for 1 or more weeks. TREATMENT Treating high blood pressure includes making lifestyle changes and possibly taking medicine. Living a healthy lifestyle can help lower high blood pressure. You may need to change some of your habits. Lifestyle changes may include:  Following the DASH diet. This diet is high in fruits, vegetables, and whole grains. It is low in salt, red meat, and added sugars.  Keep your sodium intake below 2,300 mg per day.  Getting at least 30-45 minutes of aerobic exercise at least 4 times per week.  Losing weight if necessary.  Not smoking.  Limiting alcoholic beverages.  Learning ways to reduce stress. Your health care provider may prescribe medicine if lifestyle changes are not enough to get your blood pressure under control, and if one of the following is true:  You are 4-48 years of age and your systolic blood pressure is above 140.  You are 66 years of age or older, and your systolic blood pressure is above 150.  Your diastolic blood pressure is above 90.  You have diabetes, and your systolic blood pressure is over [id number] or your diastolic blood pressure is over 90.  You have kidney disease and your blood pressure is above 140/90.  You have heart disease and your blood pressure is above 140/90. Your personal target blood pressure may vary depending on your medical conditions,  your age, and other factors. HOME CARE INSTRUCTIONS  Have your blood pressure rechecked as directed by your health care provider.   Take medicines only as directed by your health care provider. Follow the directions carefully. Blood pressure medicines must be taken as prescribed. The medicine does not work as well when you skip doses. Skipping  doses also puts you at risk for problems.  Do not smoke.   Monitor your blood pressure at home as directed by your health care provider. SEEK MEDICAL CARE IF:   You think you are having a reaction to medicines taken.  You have recurrent headaches or feel dizzy.  You have swelling in your ankles.  You have trouble with your vision. SEEK IMMEDIATE MEDICAL CARE IF:  You develop a severe headache or confusion.  You have unusual weakness, numbness, or feel faint.  You have severe chest or abdominal pain.  You vomit repeatedly.  You have trouble breathing. MAKE SURE YOU:   Understand these instructions.  Will watch your condition.  Will get help right away if you are not doing well or get worse.   This information is not intended to replace advice given to you by your health care provider. Make sure you discuss any questions you have with your health care provider.   Document Released: 01/22/2005 Document Revised: 06/08/2014 Document Reviewed: 11/14/2012 Elsevier Interactive Patient Education [employer].
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
## 2015-12-16 NOTE — Progress Notes (Signed)
Pt is in the office today for a ED follow up Pt states he is not in any pain Pt states he is not having any difficulty taking medication

## 2015-12-21 ENCOUNTER — Telehealth: Payer: Self-pay

## 2015-12-21 NOTE — Telephone Encounter (Signed)
Pt was called and informed of lab results. 

## 2017-04-01 ENCOUNTER — Emergency Department (HOSPITAL_COMMUNITY)
Admission: EM | Admit: 2017-04-01 | Discharge: 2017-04-01 | Disposition: A | Discharge status: Home / Self Care | Payer: Self-pay | Attending: Emergency Medicine | Admitting: Emergency Medicine

## 2017-04-01 ENCOUNTER — Encounter (HOSPITAL_COMMUNITY): Payer: Self-pay | Admitting: Emergency Medicine

## 2017-04-01 DIAGNOSIS — X500XXA Overexertion from strenuous movement or load, initial encounter: Secondary | ICD-10-CM | POA: Insufficient documentation

## 2017-04-01 DIAGNOSIS — Y939 Activity, unspecified: Secondary | ICD-10-CM | POA: Insufficient documentation

## 2017-04-01 DIAGNOSIS — Y929 Unspecified place or not applicable: Secondary | ICD-10-CM | POA: Insufficient documentation

## 2017-04-01 DIAGNOSIS — Y990 Civilian activity done for income or pay: Secondary | ICD-10-CM | POA: Insufficient documentation

## 2017-04-01 DIAGNOSIS — S76319A Strain of muscle, fascia and tendon of the posterior muscle group at thigh level, unspecified thigh, initial encounter: Secondary | ICD-10-CM

## 2017-04-01 MED ORDER — IBUPROFEN 600 MG PO TABS: 600.0000 mg | ORAL_TABLET | Freq: Three times a day (TID) | ORAL | 0 refills | Status: DC | PRN

## 2017-04-01 MED ORDER — METHOCARBAMOL 500 MG PO TABS: 1000.0000 mg | ORAL_TABLET | Freq: Three times a day (TID) | ORAL | 0 refills | Status: DC | PRN

## 2017-04-01 NOTE — ED Triage Notes (Signed)
Per pt, states he thinks he pulled his right hamstring at work last Wednesday-increased pain this weekend

## 2017-04-01 NOTE — ED Provider Notes (Signed)
[facility] DEPT Provider Note   CSN: [id number] Arrival date & time: 04/01/17  7253     History   Chief Complaint Chief Complaint  Patient presents with  . Leg Pain    HPI Richard Yang is a 62 y.o. male.  HPI Patient presents with 3-4 days of right hamstring pain.  States he was doing some heavy lifting on Wednesday and pain started roughly 2 days after.  No swelling or redness.  Denies back pain.  Denies focal weakness or numbness.  No urinary symptoms.  Patient is able to ambulate without difficulty. History reviewed. No pertinent past medical history.  Patient Active Problem List   Diagnosis Date Noted  . Vasculitis ([place]) 03/15/2015    History reviewed. No pertinent surgical history.     Home Medications    Prior to Admission medications   Medication Sig Start Date End Date Taking? Authorizing Provider  Diclofenac 35 MG CAPS Take 1 tablet by mouth 3 (three) times daily with meals as needed. Patient not taking: Reported on 12/16/2015 11/18/15   Pisciotta, Elmyra Ricks, PA-C  hydrochlorothiazide (HYDRODIURIL) 12.5 MG tablet Take 1 tablet (12.5 mg total) by mouth daily. 12/16/15   Argentina Donovan, PA-C  ibuprofen (ADVIL,MOTRIN) 600 MG tablet Take 1 tablet (600 mg total) by mouth 3 (three) times daily with meals as needed. 04/01/17   Julianne Rice, MD  methocarbamol (ROBAXIN) 500 MG tablet Take 2 tablets (1,000 mg total) by mouth every 8 (eight) hours as needed (Pain). 04/01/17   Julianne Rice, MD    Family History No family history on file.  Social History Social History   Tobacco Use  . Smoking status: Never Smoker  Substance Use Topics  . Alcohol use: Yes  . Drug use: Not on file     Allergies   Patient has no known allergies.   Review of Systems Review of Systems  Constitutional: Negative for chills and fever.  Gastrointestinal: Negative for abdominal pain.  Genitourinary: Negative for difficulty urinating and flank  pain.  Musculoskeletal: Positive for myalgias. Negative for arthralgias, back pain and gait problem.  Skin: Negative for rash and wound.  Neurological: Negative for weakness and numbness.  All other systems reviewed and are negative.    Physical Exam Updated Vital Signs BP (!) 175/95 (BP Location: Right Arm)   Pulse 69   Temp 97.8 F (36.6 C) (Oral)   Resp 15   SpO2 100%   Physical Exam  Constitutional: He is oriented to person, place, and time. He appears well-developed and well-nourished. No distress.  HENT:  Head: Normocephalic and atraumatic.  Eyes: EOM are normal. Pupils are equal, round, and reactive to light.  Neck: Normal range of motion. Neck supple.  Cardiovascular: Normal rate.  Pulmonary/Chest: Effort normal.  Abdominal: Soft. There is no tenderness. There is no rebound and no guarding.  Musculoskeletal: Normal range of motion. He exhibits tenderness. He exhibits no edema or deformity.  No midline thoracic or lumbar tenderness.  Negative straight leg raise bilaterally.  Distal pulses are 2+.  Patient does have tenderness to palpation over the right hamstring.  Pain is worse with full extension of the knee.  There is no obvious asymmetry or calf swelling or tenderness.  Neurological: He is alert and oriented to person, place, and time.  5/5 motor in all extremities.  Sensation intact.  Able to ambulate without assistance.  Skin: Skin is warm and dry. Capillary refill takes less than 2 seconds. No rash noted. He  is not diaphoretic. No erythema.  Psychiatric: He has a normal mood and affect. His behavior is normal.  Nursing note and vitals reviewed.    ED Treatments / Results  Labs (all labs ordered are listed, but only abnormal results are displayed) Labs Reviewed - No data to display  EKG  EKG Interpretation None       Radiology No results found.  Procedures Procedures (including critical care time)  Medications Ordered in ED Medications - No data to  display   Initial Impression / Assessment and Plan / ED Course  I have reviewed the triage vital signs and the nursing notes.  Pertinent labs & imaging results that were available during my care of the patient were reviewed by me and considered in my medical decision making (see chart for details).     Suspect hamstring strain.  Does not believe that imaging is necessary at this point.  We will treat symptomatically and advised RICE therapy.  Return precautions given.  Final Clinical Impressions(s) / ED Diagnoses   Final diagnoses:  Hamstring strain, initial encounter    ED Discharge Orders        Ordered    methocarbamol (ROBAXIN) 500 MG tablet  Every 8 hours PRN     04/01/17 1029    ibuprofen (ADVIL,MOTRIN) 600 MG tablet  3 times daily with meals PRN     04/01/17 1029       Julianne Rice, MD 04/01/17 1034

## 2017-09-22 ENCOUNTER — Encounter (HOSPITAL_COMMUNITY): Payer: Self-pay | Admitting: Emergency Medicine

## 2017-09-22 ENCOUNTER — Emergency Department (HOSPITAL_COMMUNITY)
Admission: EM | Admit: 2017-09-22 | Discharge: 2017-09-22 | Disposition: A | Discharge status: Home / Self Care | Payer: Self-pay | Attending: Emergency Medicine | Admitting: Emergency Medicine

## 2017-09-22 ENCOUNTER — Other Ambulatory Visit: Payer: Self-pay

## 2017-09-22 DIAGNOSIS — L959 Vasculitis limited to the skin, unspecified: Secondary | ICD-10-CM | POA: Insufficient documentation

## 2017-09-22 DIAGNOSIS — M79605 Pain in left leg: Secondary | ICD-10-CM | POA: Insufficient documentation

## 2017-09-22 DIAGNOSIS — Z79899 Other long term (current) drug therapy: Secondary | ICD-10-CM | POA: Insufficient documentation

## 2017-09-22 MED ORDER — IBUPROFEN 600 MG PO TABS: 600.0000 mg | ORAL_TABLET | Freq: Four times a day (QID) | ORAL | 0 refills | Status: DC | PRN

## 2017-09-22 MED ORDER — METHOCARBAMOL 500 MG PO TABS: 500.0000 mg | ORAL_TABLET | Freq: Two times a day (BID) | ORAL | 0 refills | Status: DC

## 2017-09-22 NOTE — Discharge Instructions (Addendum)
Please read attached information. If you experience any new or worsening signs or symptoms please return to the emergency room for evaluation. Please follow-up with your primary care provider or specialist as discussed. Please use medication prescribed only as directed and discontinue taking if you have any concerning signs or symptoms.   °

## 2017-09-22 NOTE — ED Triage Notes (Signed)
Pt. Stated, Richard Yang had left leg pain since Thursday, from my butt to the knee on the back. It doesn't hurt that much when I walk but when I lay down or sit it bothers me so bad.

## 2017-09-22 NOTE — ED Notes (Signed)
Pt verbalized understanding of d/c instructions and has no further questions, VSS, NAD.  

## 2017-09-22 NOTE — ED Provider Notes (Signed)
[facility] EMERGENCY DEPARTMENT Provider Note   CSN: [id number] Arrival date & time: 09/22/17  0725     History   Chief Complaint Chief Complaint  Patient presents with  . Leg Pain    HPI Richard Yang is a 62 y.o. male.  HPI    62 year old male presents today with complaints of left leg pain.  Patient notes last week he was working at the [employer].  He denies any trauma or injury, but notes developing pain in his left posterior leg.  Patient notes no pain with walking, but notes the pain worsens while laying down.  He denies any swelling or edema, denies any IV drug use, fever, trauma, or any other red flags.  No medications prior to arrival.  Patient denies low back pain.   History reviewed. No pertinent past medical history.  Patient Active Problem List   Diagnosis Date Noted  . Vasculitis ([place]) 03/15/2015    History reviewed. No pertinent surgical history.      Home Medications    Prior to Admission medications   Medication Sig Start Date End Date Taking? Authorizing Provider  Diclofenac 35 MG CAPS Take 1 tablet by mouth 3 (three) times daily with meals as needed. Patient not taking: Reported on 12/16/2015 11/18/15   Pisciotta, Elmyra Ricks, PA-C  hydrochlorothiazide (HYDRODIURIL) 12.5 MG tablet Take 1 tablet (12.5 mg total) by mouth daily. 12/16/15   Argentina Donovan, PA-C  ibuprofen (ADVIL,MOTRIN) 600 MG tablet Take 1 tablet (600 mg total) by mouth every 6 (six) hours as needed. 09/22/17   Yanelle Sousa, Dellis Filbert, PA-C  methocarbamol (ROBAXIN) 500 MG tablet Take 1 tablet (500 mg total) by mouth 2 (two) times daily. 09/22/17   Okey Regal, PA-C    Family History No family history on file.  Social History Social History   Tobacco Use  . Smoking status: Never Smoker  . Smokeless tobacco: Never Used  Substance Use Topics  . Alcohol use: Yes  . Drug use: Not Currently     Allergies   Patient has no known  allergies.   Review of Systems Review of Systems  All other systems reviewed and are negative.    Physical Exam Updated Vital Signs BP (!) 167/101 (BP Location: Right Arm)   Pulse 69   Temp 98.7 F (37.1 C) (Oral)   Resp 12   Ht 5\' 10"  (1.778 m)   Wt 89.8 kg   SpO2 96%   BMI 28.41 kg/m   Physical Exam  Constitutional: He is oriented to person, place, and time. He appears well-developed and well-nourished.  HENT:  Head: Normocephalic and atraumatic.  Eyes: Pupils are equal, round, and reactive to light. Conjunctivae are normal. Right eye exhibits no discharge. Left eye exhibits no discharge. No scleral icterus.  Neck: Normal range of motion. No JVD present. No tracheal deviation present.  Pulmonary/Chest: Effort normal. No stridor.  Musculoskeletal:  Bilateral extremities atraumatic without swelling or edema, minor tenderness palpation of the left posterior thigh, straight leg raise produces pain in the posterior thigh-no CT or L-spine tenderness palpation-distal sensation and strength intact ambulates with no antalgic gait  Neurological: He is alert and oriented to person, place, and time. Coordination normal.  Psychiatric: He has a normal mood and affect. His behavior is normal. Judgment and thought content normal.  Nursing note and vitals reviewed.    ED Treatments / Results  Labs (all labs ordered are listed, but only abnormal results are displayed) Labs Reviewed -  No data to display  EKG None  Radiology No results found.  Procedures Procedures (including critical care time)  Medications Ordered in ED Medications - No data to display   Initial Impression / Assessment and Plan / ED Course  I have reviewed the triage vital signs and the nursing notes.  Pertinent labs & imaging results that were available during my care of the patient were reviewed by me and considered in my medical decision making (see chart for details).     Labs:    Imaging:  Consults:  Therapeutics:  Discharge Meds: Ibuprofen, Robaxin Assessment/Plan: 62 year old male presents today with likely muscular strain versus sciatic symptoms.  I have low suspicion for DVT, or any other life-threatening etiology at this time.  Patient be treated with naproxen, muscle relaxers, outpatient follow-up and strict return precautions.  He verbalized understanding and agreement to today's plan had no further questions or concerns at the time discharge.      Final Clinical Impressions(s) / ED Diagnoses   Final diagnoses:  Left leg pain    ED Discharge Orders         Ordered    methocarbamol (ROBAXIN) 500 MG tablet  2 times daily,   Status:  Discontinued     09/22/17 0938    ibuprofen (ADVIL,MOTRIN) 600 MG tablet  Every 6 hours PRN,   Status:  Discontinued     09/22/17 0938    ibuprofen (ADVIL,MOTRIN) 600 MG tablet  Every 6 hours PRN     09/22/17 0938    methocarbamol (ROBAXIN) 500 MG tablet  2 times daily     09/22/17 1314           Okey Regal, PA-C 09/22/17 3888    Noemi Chapel, MD 09/23/17 1451

## 2019-06-18 ENCOUNTER — Other Ambulatory Visit: Payer: Self-pay

## 2019-06-18 ENCOUNTER — Emergency Department (HOSPITAL_COMMUNITY): Payer: Self-pay

## 2019-06-18 ENCOUNTER — Emergency Department (HOSPITAL_COMMUNITY)
Admission: EM | Admit: 2019-06-18 | Discharge: 2019-06-18 | Disposition: A | Discharge status: Home / Self Care | Payer: Self-pay | Attending: Emergency Medicine | Admitting: Emergency Medicine

## 2019-06-18 ENCOUNTER — Encounter (HOSPITAL_COMMUNITY): Payer: Self-pay | Admitting: Pediatrics

## 2019-06-18 DIAGNOSIS — K7030 Alcoholic cirrhosis of liver without ascites: Secondary | ICD-10-CM | POA: Insufficient documentation

## 2019-06-18 DIAGNOSIS — K4020 Bilateral inguinal hernia, without obstruction or gangrene, not specified as recurrent: Secondary | ICD-10-CM | POA: Insufficient documentation

## 2019-06-18 DIAGNOSIS — Z79899 Other long term (current) drug therapy: Secondary | ICD-10-CM | POA: Insufficient documentation

## 2019-06-18 LAB — COMPREHENSIVE METABOLIC PANEL
ALT: 60 U/L — ABNORMAL HIGH (ref 0–44)
AST: 114 U/L — ABNORMAL HIGH (ref 15–41)
Albumin: 2.6 g/dL — ABNORMAL LOW (ref 3.5–5.0)
Alkaline Phosphatase: 90 U/L (ref 38–126)
Anion gap: 11 (ref 5–15)
BUN: 15 mg/dL (ref 8–23)
CO2: 23 mmol/L (ref 22–32)
Calcium: 8.8 mg/dL — ABNORMAL LOW (ref 8.9–10.3)
Chloride: 103 mmol/L (ref 98–111)
Creatinine, Ser: 1.28 mg/dL — ABNORMAL HIGH (ref 0.61–1.24)
GFR calc Af Amer: >60 mL/min (ref >60)
GFR calc non Af Amer: 59 mL/min — ABNORMAL LOW (ref >60)
Glucose, Bld: 101 mg/dL — ABNORMAL HIGH (ref 70–99)
Potassium: 3.9 mmol/L (ref 3.5–5.1)
Sodium: 137 mmol/L (ref 135–145)
Total Bilirubin: 3.3 mg/dL — ABNORMAL HIGH (ref 0.3–1.2)
Total Protein: 7.8 g/dL (ref 6.5–8.1)

## 2019-06-18 LAB — URINALYSIS, ROUTINE W REFLEX MICROSCOPIC
Bacteria, UA: NONE SEEN
Bilirubin Urine: NEGATIVE
Glucose, UA: NEGATIVE mg/dL
Hgb urine dipstick: NEGATIVE
Ketones, ur: 5 mg/dL — AB
Nitrite: NEGATIVE
Protein, ur: 30 mg/dL — AB
Specific Gravity, Urine: 1.034 — ABNORMAL HIGH (ref 1.005–1.030)
pH: 5 (ref 5.0–8.0)

## 2019-06-18 LAB — CBC
HCT: 35.5 % — ABNORMAL LOW (ref 39.0–52.0)
Hemoglobin: 12.3 g/dL — ABNORMAL LOW (ref 13.0–17.0)
MCH: 34.1 pg — ABNORMAL HIGH (ref 26.0–34.0)
MCHC: 34.6 g/dL (ref 30.0–36.0)
MCV: 98.3 fL (ref 80.0–100.0)
Platelets: 67 10*3/uL — ABNORMAL LOW (ref 150–400)
RBC: 3.61 MIL/uL — ABNORMAL LOW (ref 4.22–5.81)
RDW: 16.5 % — ABNORMAL HIGH (ref 11.5–15.5)
WBC: 5 10*3/uL (ref 4.0–10.5)
nRBC: 0 % (ref 0.0–0.2)

## 2019-06-18 LAB — LIPASE, BLOOD: Lipase: 63 U/L — ABNORMAL HIGH (ref 11–51)

## 2019-06-18 MED ORDER — NAPROXEN 500 MG PO TABS: 500.0000 mg | ORAL_TABLET | Freq: Two times a day (BID) | ORAL | 0 refills | Status: AC

## 2019-06-18 MED ORDER — SODIUM CHLORIDE 0.9% FLUSH: 3.0000 mL | Freq: Once | INTRAVENOUS | Status: DC

## 2019-06-18 MED ORDER — IOHEXOL 300 MG/ML  SOLN
75.0000 mL | Freq: Once | INTRAMUSCULAR | Status: AC | PRN
  Administered 2019-06-18: 75 mL via INTRAVENOUS

## 2019-06-18 NOTE — ED Provider Notes (Signed)
[facility] EMERGENCY DEPARTMENT Provider Note   CSN: [id number] Arrival date & time: 06/18/19  1036     History Chief Complaint  Patient presents with  . Groin Pain  . Bloated    Richard Yang is a 64 y.o. male.  64 y/o male presenting t ohe ER for right groin pain and LE edema. Patient reports theses are two separate problems which occurred at different times. Patient reports his only medical history is "I think I was diagnosed with Hepatitis C before". He does not see a PMD and does not take any medications. Reports leg swelling began a few months ago but is acutally improved from before. Denies cough, CP, SOB, DOE or leg pain. Reports he has a groin bulge on the right side. He though he strained his groin when lifting heavy boxes several weeks ago but now feels there is swelling and it is not getting better. Also reports bulge in belly button with lifting. He is having normal BM        History reviewed. No pertinent past medical history.  Patient Active Problem List   Diagnosis Date Noted  . Vasculitis ([place]) 03/15/2015    History reviewed. No pertinent surgical history.     No family history on file.  Social History   Tobacco Use  . Smoking status: Never Smoker  . Smokeless tobacco: Never Used  Substance Use Topics  . Alcohol use: Yes  . Drug use: Not Currently    Home Medications Prior to Admission medications   Medication Sig Start Date End Date Taking? Authorizing Provider  hydrochlorothiazide (HYDRODIURIL) 12.5 MG tablet Take 1 tablet (12.5 mg total) by mouth daily. 12/16/15   Argentina Donovan, PA-C  naproxen (NAPROSYN) 500 MG tablet Take 1 tablet (500 mg total) by mouth 2 (two) times daily for 5 days. 06/18/19 06/23/19  Alveria Apley, PA-C    Allergies    Patient has no known allergies.  Review of Systems   Review of Systems  Constitutional: Negative for activity change, chills and fever.  HENT: Negative for congestion and sore  throat.   Respiratory: Negative for cough and shortness of breath.   Cardiovascular: Positive for leg swelling. Negative for chest pain and palpitations.  Gastrointestinal: Negative for abdominal pain, blood in stool, constipation, diarrhea, nausea and vomiting.  Endocrine: Negative for polyuria.  Genitourinary: Negative for dysuria.  Musculoskeletal: Positive for arthralgias and myalgias. Negative for back pain.  Skin: Negative for color change, rash and wound.  Neurological: Negative for dizziness and headaches.  Hematological: Does not bruise/bleed easily.    Physical Exam Updated Vital Signs BP (!) 156/86 (BP Location: Right Arm)   Pulse 85   Temp 98.1 F (36.7 C) (Oral)   Resp 18   Ht 5\' 10"  (1.778 m)   SpO2 99%   BMI 28.41 kg/m   Physical Exam Vitals and nursing note reviewed.  Constitutional:      General: He is not in acute distress.    Appearance: Normal appearance. He is not ill-appearing, toxic-appearing or diaphoretic.  HENT:     Head: Normocephalic.     Mouth/Throat:     Mouth: Mucous membranes are moist.  Eyes:     Conjunctiva/sclera: Conjunctivae normal.  Cardiovascular:     Rate and Rhythm: Normal rate and regular rhythm.  Pulmonary:     Effort: Pulmonary effort is normal.  Abdominal:     General: Bowel sounds are normal.     Palpations: Abdomen is soft.  Comments: Right inguinal hernia, large. Also moderate umbilical hernia  Musculoskeletal:     Right lower leg: Edema (3+) present.     Left lower leg: Edema (3+) present.     Comments: BLE edema. Normal distal pulses and sensation  Skin:    General: Skin is warm and dry.  Neurological:     General: No focal deficit present.     Mental Status: He is alert.  Psychiatric:        Mood and Affect: Mood normal.     ED Results / Procedures / Treatments   Labs (all labs ordered are listed, but only abnormal results are displayed) Labs Reviewed  LIPASE, BLOOD - Abnormal; Notable for the following  components:      Result Value   Lipase 63 (*)    All other components within normal limits  COMPREHENSIVE METABOLIC PANEL - Abnormal; Notable for the following components:   Glucose, Bld 101 (*)    Creatinine, Ser 1.28 (*)    Calcium 8.8 (*)    Albumin 2.6 (*)    AST 114 (*)    ALT 60 (*)    Total Bilirubin 3.3 (*)    GFR calc non Af Amer 59 (*)    All other components within normal limits  CBC - Abnormal; Notable for the following components:   RBC 3.61 (*)    Hemoglobin 12.3 (*)    HCT 35.5 (*)    MCH 34.1 (*)    RDW 16.5 (*)    Platelets 67 (*)    All other components within normal limits  URINALYSIS, ROUTINE W REFLEX MICROSCOPIC - Abnormal; Notable for the following components:   Color, Urine AMBER (*)    Specific Gravity, Urine 1.034 (*)    Ketones, ur 5 (*)    Protein, ur 30 (*)    Leukocytes,Ua MODERATE (*)    All other components within normal limits    EKG None  Radiology CT ABDOMEN PELVIS W CONTRAST  Result Date: 06/18/2019 CLINICAL DATA:  Evaluate hernia. History of alcohol abuse. EXAM: CT ABDOMEN AND PELVIS WITH CONTRAST TECHNIQUE: Multidetector CT imaging of the abdomen and pelvis was performed using the standard protocol following bolus administration of intravenous contrast. CONTRAST:  90mL OMNIPAQUE IOHEXOL 300 MG/ML  SOLN COMPARISON:  None. FINDINGS: Lower chest: No acute abnormality. Hepatobiliary: There is hypertrophy of the lateral segment of left lobe of liver and caudate lobe. The contour the liver is nodular. No focal liver abnormalities identified. There are stones layering within the gallbladder measuring up to 6 mm. No bile duct dilatation. Pancreas: Unremarkable. No pancreatic ductal dilatation or surrounding inflammatory changes. Spleen: The spleen is normal in size measuring 11 cm in length. No focal splenic abnormality. Adrenals/Urinary Tract: Normal adrenal glands. Two small cystic structures within the inferior pole of left kidney are noted  measuring up to 9 mm. Although technically too small to reliably characterize these are favored to represent small cysts. No solid enhancing mass or hydronephrosis identified. The urinary bladder appears normal. Stomach/Bowel: Stomach is within normal limits. Appendix appears normal. No evidence of bowel wall thickening, distention, or inflammatory changes. Vascular/Lymphatic: Aortic atherosclerosis without aneurysm. Small but prominent upper abdominal lymph nodes are nonspecific in the setting of cirrhosis. No abdominopelvic adenopathy. Reproductive: Prostate gland is enlarged. Other: There is a moderate to large volume of ascites within the abdomen and pelvis. Umbilical hernia is identified which contains fat as well as ascites measuring 4.6 cm, image 67/3. There is a large  right inguinal hernia which is filled with ascites. Moderate size fat containing left inguinal hernia. Musculoskeletal: No aggressive lytic or sclerotic bone lesion. IMPRESSION: 1. Morphologic features of the liver compatible with cirrhosis. Stigmata of portal venous hypertension including moderate to large volume of ascites. 2. Gallstones. 3. Bilateral inguinal hernias. 4. Umbilical hernia contains fat as well as ascites. 5. Aortic atherosclerosis. Aortic Atherosclerosis (ICD10-I70.0). Electronically Signed   By: Kerby Moors M.D.   On: 06/18/2019 15:11    Procedures Procedures (including critical care time)  Medications Ordered in ED Medications  iohexol (OMNIPAQUE) 300 MG/ML solution 75 mL (75 mLs Intravenous Contrast Given 06/18/19 1347)    ED Course  I have reviewed the triage vital signs and the nursing notes.  Pertinent labs & imaging results that were available during my care of the patient were reviewed by me and considered in my medical decision making (see chart for details).  Clinical Course as of Jun 19 1114  Thu Jun 18, 2019  1403 Patient presenting for months of LE edema and right groin pain. Inguinal hernia on  my exam but no signs of incarcerations. Labs revealing worsening thrombocytopenia from before and worsening LFT and creatinine of 1.28. These were all problems before when he was seeing PMD but never followed up on them. He does report regular, heavy drinking about a bottle of wine daily. CT scan is pending   [KM]  [id number] Ct scan showing bilateral inguinal hernias with umbilical hernia and signs of cirrhosis and portal vein HTN. Discussed with Dr. Alvino Chapel and plan agreed upon. Patient is otherwise stable and findings are consistent with his heavy alcohol use. Discussed with patient as well. He will need GI and Pmd follow up. Also would like referral to general surgeon for his hernias.   [KM]    Clinical Course User Index [KM] Kristine Royal   MDM Rules/Calculators/A&P                      Based on review of vitals, medical screening exam, lab work and/or imaging, there does not appear to be an acute, emergent etiology for the patient's symptoms. Counseled pt on good return precautions and encouraged both PCP and ED follow-up as needed.  Prior to discharge, I also discussed incidental imaging findings with patient in detail and advised appropriate, recommended follow-up in detail.  Clinical Impression: 1. Alcoholic cirrhosis of liver without ascites ([place])   2. Bilateral inguinal hernia without obstruction or gangrene, recurrence not specified     Disposition: Discharge  Prior to providing a prescription for a controlled substance, I independently reviewed the patient's recent prescription history on the [place]. The patient had no recent or regular prescriptions and was deemed appropriate for a brief, less than 3 day prescription of narcotic for acute analgesia.  This note was prepared with assistance of Systems analyst. Occasional wrong-word or sound-a-like substitutions may have occurred due to the inherent limitations of  voice recognition software.  Final Clinical Impression(s) / ED Diagnoses Final diagnoses:  Alcoholic cirrhosis of liver without ascites ([place])  Bilateral inguinal hernia without obstruction or gangrene, recurrence not specified    Rx / DC Orders ED Discharge Orders         Ordered    naproxen (NAPROSYN) 500 MG tablet  2 times daily     06/18/19 1531           Alveria Apley, [place] 06/19/19 1116  Davonna Belling, MD 06/19/19 2337

## 2019-06-18 NOTE — Discharge Instructions (Addendum)
You are seen today for abdominal pain and leg swelling.  It appears that you have hernias in your belly. You also have cirrhosis of your liver and elevated liver and kidney blood tests. This is likely all from your alcohol use. We are referring you to GI specialist and a general surgeon. Please refrain from any alcohol or tylenol. Drink plenty of water. Thank you for allowing me to care for you today. Please return to the emergency department if you have new or worsening symptoms. Take your medications as instructed.

## 2019-06-18 NOTE — ED Notes (Signed)
Patient verbalizes understanding of discharge instructions. Opportunity for questioning and answers were provided. Armband removed by staff, pt discharged from ED to home 

## 2019-06-18 NOTE — ED Triage Notes (Signed)
C/O groin pain about 2 weeks ago and it comes and go; c/o swelling in stomach legs; denies prior medical and surgical hx.

## 2019-06-22 NOTE — Progress Notes (Signed)
Patient ID: Richard Yang, male   DOB: 12-Aug-1955, 64 y.o.   MRN: [id number]

## 2019-06-24 ENCOUNTER — Encounter: Payer: Self-pay | Admitting: Internal Medicine

## 2019-06-24 ENCOUNTER — Other Ambulatory Visit: Payer: Self-pay

## 2019-06-24 ENCOUNTER — Ambulatory Visit: Payer: Self-pay | Attending: Internal Medicine | Admitting: Internal Medicine

## 2019-06-24 ENCOUNTER — Ambulatory Visit: Payer: Self-pay | Attending: Critical Care Medicine | Admitting: Licensed Clinical Social Worker

## 2019-06-24 DIAGNOSIS — K7030 Alcoholic cirrhosis of liver without ascites: Secondary | ICD-10-CM | POA: Insufficient documentation

## 2019-06-24 DIAGNOSIS — F439 Reaction to severe stress, unspecified: Secondary | ICD-10-CM

## 2019-06-24 DIAGNOSIS — K7031 Alcoholic cirrhosis of liver with ascites: Secondary | ICD-10-CM

## 2019-06-24 DIAGNOSIS — I10 Essential (primary) hypertension: Secondary | ICD-10-CM

## 2019-06-24 MED ORDER — FUROSEMIDE 20 MG PO TABS: 20.0000 mg | ORAL_TABLET | Freq: Every day | ORAL | 3 refills | Status: DC

## 2019-06-24 MED ORDER — SPIRONOLACTONE 50 MG PO TABS: 50.0000 mg | ORAL_TABLET | Freq: Every day | ORAL | 3 refills | Status: DC

## 2019-06-24 MED FILL — SPIRONOLACTONE 50 MG TABS: 50 | 30 days supply | Qty: 30 | Fill #0

## 2019-06-24 MED FILL — FUROSEMIDE 20 MG TABS: 20 | 30 days supply | Qty: 30 | Fill #0

## 2019-06-24 NOTE — Assessment & Plan Note (Signed)
Cirrhosis with portal hypertension- he has not been treated.  Discussed need for salt restriction and he needs diuretics Start with lasix and spironolactone.  He will need follow up in 2 weeks and at that time will need labs (CMET), CBC. Lab Results  Component Value Date   NA 137 06/18/2019   K 3.9 06/18/2019   CO2 23 06/18/2019   GLUCOSE 101 (H) 06/18/2019   BUN 15 06/18/2019   CREATININE 1.28 (H) 06/18/2019   CALCIUM 8.8 (L) 06/18/2019   GFRNONAA 59 (L) 06/18/2019   GFRAA >60 06/18/2019   Lab Results  Component Value Date   ALT 60 (H) 06/18/2019   AST 114 (H) 06/18/2019   ALKPHOS 90 06/18/2019   BILITOT 3.3 (H) 06/18/2019   He may need GI f/u Will not use PPI yet

## 2019-06-24 NOTE — Assessment & Plan Note (Signed)
He has htn- reviewed flow sheets. Bps in the 150s-160s range. He will need treatment. We will see if furosemide/spironolactone make any difference.

## 2019-06-24 NOTE — Progress Notes (Signed)
Patient comes in for follow up- reviewed ED notes.  He complains of LE edema, abdominal swelling. He notes alcohol use up until 3 weeks ago. He has stopped all alcohol. In the ED he was prescribed naproxen for groin pain. Reviewed ED labs.  He is eating well and has no other complaints. No GI blood loss.   No past medical history on file.  Social History   Socioeconomic History  . Marital status: Single    Spouse name: Not on file  . Number of children: Not on file  . Years of education: Not on file  . Highest education level: Not on file  Occupational History  . Not on file  Tobacco Use  . Smoking status: Never Smoker  . Smokeless tobacco: Never Used  Substance and Sexual Activity  . Alcohol use: Not Currently  . Drug use: Not Currently  . Sexual activity: Not on file  Other Topics Concern  . Not on file  Social History Narrative  . Not on file   Social Determinants of Health   Financial Resource Strain:   . Difficulty of Paying Living Expenses:   Food Insecurity:   . Worried About Charity fundraiser in the Last Year:   . Arboriculturist in the Last Year:   Transportation Needs:   . Film/video editor (Medical):   Marland Kitchen Lack of Transportation (Non-Medical):   Physical Activity:   . Days of Exercise per Week:   . Minutes of Exercise per Session:   Stress:   . Feeling of Stress :   Social Connections:   . Frequency of Communication with Friends and Family:   . Frequency of Social Gatherings with Friends and Family:   . Attends Religious Services:   . Active Member of Clubs or Organizations:   . Attends Archivist Meetings:   Marland Kitchen Marital Status:   Intimate Partner Violence:   . Fear of Current or Ex-Partner:   . Emotionally Abused:   Marland Kitchen Physically Abused:   . Sexually Abused:     No past surgical history on file.  No family history on file.  No Known Allergies  Current Outpatient Medications on File Prior to Visit  Medication Sig Dispense Refill   . hydrochlorothiazide (HYDRODIURIL) 12.5 MG tablet Take 1 tablet (12.5 mg total) by mouth daily. (Patient not taking: Reported on 06/24/2019) 30 tablet 1   No current facility-administered medications on file prior to visit.     patient denies chest pain, shortness of breath, orthopnea. Denies lower extremity edema, abdominal pain, change in appetite, change in bowel movements. Patient denies rashes, musculoskeletal complaints. No other specific complaints in a complete review of systems.   BP (!) 162/82 (BP Location: Left Arm, Patient Position: Sitting, Cuff Size: Normal)   Pulse 88   Temp 98 F (36.7 C)   Resp 18   Ht 5\' 11"  (1.803 m)   Wt 240 lb (108.9 kg)   SpO2 97%   BMI 33.47 kg/m  pleasant!  well-developed well-nourished male in no acute distress. HEENT exam atraumatic, normocephalic, neck supple without jugular venous distention. Chest clear to auscultation cardiac exam S1-S2 are regular. Abdominal exam, distended overweight with bowel sounds, soft and nontender, + fluid wave.. Extremities 3+ edema to midthigh. edema. Neurologic exam is alert with a normal gait.   Liver cirrhosis, alcoholic (HCC) Cirrhosis with portal hypertension- he has not been treated.  Discussed need for salt restriction and he needs diuretics Start with lasix and  spironolactone.  He will need follow up in 2 weeks and at that time will need labs (CMET), CBC. Lab Results  Component Value Date   NA 137 06/18/2019   K 3.9 06/18/2019   CO2 23 06/18/2019   GLUCOSE 101 (H) 06/18/2019   BUN 15 06/18/2019   CREATININE 1.28 (H) 06/18/2019   CALCIUM 8.8 (L) 06/18/2019   GFRNONAA 59 (L) 06/18/2019   GFRAA >60 06/18/2019   Lab Results  Component Value Date   ALT 60 (H) 06/18/2019   AST 114 (H) 06/18/2019   ALKPHOS 90 06/18/2019   BILITOT 3.3 (H) 06/18/2019   He may need GI f/u Will not use PPI yet  Hypertension He has htn- reviewed flow sheets. Bps in the 150s-160s range. He will need treatment. We  will see if furosemide/spironolactone make any difference.

## 2019-06-24 NOTE — Progress Notes (Signed)
Hospital f /u  Pt sates did not drink alcohol for the past 2 wk  C /O BL leg EDEMA +2 X 2 wks

## 2019-06-24 NOTE — Patient Instructions (Signed)
Avoid salt.  Do not add salt to your food. Avoid prepared foods - chips, crackers, soups, etc.. these all are high in sodium.

## 2019-07-01 ENCOUNTER — Ambulatory Visit: Payer: Self-pay | Admitting: Family Medicine

## 2019-07-01 ENCOUNTER — Other Ambulatory Visit: Payer: Self-pay

## 2019-07-01 ENCOUNTER — Telehealth: Payer: Self-pay | Admitting: Licensed Clinical Social Worker

## 2019-07-01 ENCOUNTER — Ambulatory Visit: Payer: Self-pay | Attending: Family Medicine | Admitting: Licensed Clinical Social Worker

## 2019-07-01 NOTE — Telephone Encounter (Signed)
Call placed to patient regarding scheduled follow up visit. Mailbox was full; therefore, LCSW was unable to leave a message for a return call.

## 2019-07-13 ENCOUNTER — Ambulatory Visit: Payer: Self-pay | Attending: Internal Medicine

## 2019-07-13 ENCOUNTER — Ambulatory Visit: Payer: Self-pay | Attending: Family Medicine | Admitting: Critical Care Medicine

## 2019-07-13 ENCOUNTER — Encounter: Payer: Self-pay | Admitting: Critical Care Medicine

## 2019-07-13 ENCOUNTER — Other Ambulatory Visit: Payer: Self-pay

## 2019-07-13 DIAGNOSIS — F1010 Alcohol abuse, uncomplicated: Secondary | ICD-10-CM | POA: Insufficient documentation

## 2019-07-13 DIAGNOSIS — I700 Atherosclerosis of aorta: Secondary | ICD-10-CM | POA: Insufficient documentation

## 2019-07-13 DIAGNOSIS — K429 Umbilical hernia without obstruction or gangrene: Secondary | ICD-10-CM | POA: Insufficient documentation

## 2019-07-13 DIAGNOSIS — K4020 Bilateral inguinal hernia, without obstruction or gangrene, not specified as recurrent: Secondary | ICD-10-CM

## 2019-07-13 DIAGNOSIS — Z87898 Personal history of other specified conditions: Secondary | ICD-10-CM | POA: Insufficient documentation

## 2019-07-13 DIAGNOSIS — Z23 Encounter for immunization: Secondary | ICD-10-CM

## 2019-07-13 DIAGNOSIS — K7031 Alcoholic cirrhosis of liver with ascites: Secondary | ICD-10-CM

## 2019-07-13 DIAGNOSIS — K4090 Unilateral inguinal hernia, without obstruction or gangrene, not specified as recurrent: Secondary | ICD-10-CM | POA: Insufficient documentation

## 2019-07-13 NOTE — Assessment & Plan Note (Signed)
CT scan finding of aortic atherosclerosis we will follow-up lipid panel at next visit

## 2019-07-13 NOTE — Progress Notes (Signed)
   Covid-19 Vaccination Clinic  Name:  Richard Yang    MRN: [id number] DOB: September 03, 1955  07/13/2019  Mr. Richard Yang was observed post Covid-19 immunization for 15 minutes without incident. He was provided with Vaccine Information Sheet and instruction to access the V-Safe system.   Mr. Richard Yang was instructed to call 911 with any severe reactions post vaccine: Marland Kitchen Difficulty breathing  . Swelling of face and throat  . A fast heartbeat  . A bad rash all over body  . Dizziness and weakness   Immunizations Administered    Name Date Dose VIS Date Route   Moderna COVID-19 Vaccine 07/13/2019  2:12 PM 0.5 mL 01/2019 Intramuscular   Manufacturer: Moderna   Lot: [id number]   [place]: 50277-412-87

## 2019-07-13 NOTE — Assessment & Plan Note (Signed)
Alcoholic liver cirrhosis with CT scan May of 2021 showing stigmata of portal venous hypertension including moderate ascites and also asked atherosclerosis of the aorta gallstones and bilateral inguinal hernias and umbilicus hernias with liver cirrhosis  We will discontinue Aldactone and have the patient continue furosemide 20 mg daily we will bring the patient in for follow-up lab studies to include metabolic panel and complete blood count hepatitis C and D levels. I encouraged the patient obtain a Covid vaccine series and will hold off on Pneumovax and tetanus shots until the Covid vaccine series is completed.  The patient ultimately may need to be seen by gastroenterology

## 2019-07-13 NOTE — Assessment & Plan Note (Signed)
None recurrent bilateral inguinal hernia will observe for now

## 2019-07-13 NOTE — Progress Notes (Signed)
States that Spironolactone was making him feel bad. He has not taken the medication he is only taking his Lasix.

## 2019-07-13 NOTE — Assessment & Plan Note (Signed)
Prior history of alcohol use with her cirrhotic liver disease will watch for now as he has had alcohol cessation

## 2019-07-13 NOTE — Addendum Note (Signed)
Addended by: Asencion Noble E on: 07/13/2019 02:19 PM   Modules accepted: Orders

## 2019-07-13 NOTE — Progress Notes (Signed)
Subjective:    Patient ID: Richard Yang, male    DOB: 01/18/1956, 64 y.o.   MRN: [id number] Virtual Visit via Telephone Note  I connected with Richard Yang on 07/13/19 at  9:20 AM EDT by telephone and verified that I am speaking with the correct person using two identifiers.   Consent:  I discussed the limitations, risks, security and privacy concerns of performing an evaluation and management service by telephone and the availability of in person appointments. I also discussed with the patient that there may be a patient responsible charge related to this service. The patient expressed understanding and agreed to proceed.  Location of patient: Patient was at home  Location of provider: I was in the office  Persons participating in the televisit with the patient.   No one else on the call    History  64 y.o.M  Hx of ETOH cirrohsis, HTN to establish PCP care   This is a telephone visit follow-up from a May visit with primary care. The patient has history of alcohol induced cirrhotic changes based on emergency room visit in May.  Patient also has hypertension as well.  At the last visit the patient was given Aldactone and Lasix and he has a home blood pressure cuff.  He states his blood pressure now is anywhere from 120/76-130/78.  The patient states the edema in the lower extremities and abdomen are better.  He states he did have side effects on Aldactone is taking furosemide only at this time.  He is no longer been drinking alcohol since Mother's Day in May of this year.  His abdominal girth is gone down.  He does not weigh himself we do not know what his weight actually is.  The patient is thinking about getting a Covid vaccine but is yet to achieve this.    History reviewed. No pertinent past medical history.   History reviewed. No pertinent family history.   Social History   Socioeconomic History  . Marital status: Single    Spouse name: Not on file  . Number of children: Not on  file  . Years of education: Not on file  . Highest education level: Not on file  Occupational History  . Not on file  Tobacco Use  . Smoking status: Never Smoker  . Smokeless tobacco: Never Used  Substance and Sexual Activity  . Alcohol use: Not Currently  . Drug use: Not Currently  . Sexual activity: Not on file  Other Topics Concern  . Not on file  Social History Narrative  . Not on file   Social Determinants of Health   Financial Resource Strain:   . Difficulty of Paying Living Expenses:   Food Insecurity:   . Worried About Charity fundraiser in the Last Year:   . Arboriculturist in the Last Year:   Transportation Needs:   . Film/video editor (Medical):   Marland Kitchen Lack of Transportation (Non-Medical):   Physical Activity:   . Days of Exercise per Week:   . Minutes of Exercise per Session:   Stress:   . Feeling of Stress :   Social Connections:   . Frequency of Communication with Friends and Family:   . Frequency of Social Gatherings with Friends and Family:   . Attends Religious Services:   . Active Member of Clubs or Organizations:   . Attends Archivist Meetings:   Marland Kitchen Marital Status:   Intimate Partner Violence:   . Fear  of Current or Ex-Partner:   . Emotionally Abused:   Marland Kitchen Physically Abused:   . Sexually Abused:      No Known Allergies   Outpatient Medications Prior to Visit  Medication Sig Dispense Refill  . furosemide (LASIX) 20 MG tablet Take 1 tablet (20 mg total) by mouth daily. 90 tablet 3  . hydrochlorothiazide (HYDRODIURIL) 12.5 MG tablet Take 1 tablet (12.5 mg total) by mouth daily. (Patient not taking: Reported on 06/24/2019) 30 tablet 1  . spironolactone (ALDACTONE) 50 MG tablet Take 1 tablet (50 mg total) by mouth daily. (Patient not taking: Reported on 07/13/2019) 90 tablet 3   No facility-administered medications prior to visit.    Review of Systems  Constitutional: Positive for activity change and fatigue.  HENT: Negative.     Respiratory: Negative.   Cardiovascular: Positive for leg swelling.  Gastrointestinal: Negative.   Musculoskeletal: Negative.   Neurological: Negative.   Psychiatric/Behavioral: Negative.        Objective:   Physical Exam No exam this is a phone visit       Assessment & Plan:  I personally reviewed all images and lab data in the [facility] system as well as any outside material available during this office visit and agree with the  radiology impressions.   Liver cirrhosis, alcoholic (HCC) Alcoholic liver cirrhosis with CT scan May of 2021 showing stigmata of portal venous hypertension including moderate ascites and also asked atherosclerosis of the aorta gallstones and bilateral inguinal hernias and umbilicus hernias with liver cirrhosis  We will discontinue Aldactone and have the patient continue furosemide 20 mg daily we will bring the patient in for follow-up lab studies to include metabolic panel and complete blood count hepatitis C and D levels. I encouraged the patient obtain a Covid vaccine series and will hold off on Pneumovax and tetanus shots until the Covid vaccine series is completed.  The patient ultimately may need to be seen by gastroenterology   Umbilical hernia Umbilical hernia with fat and ascites only will observe for now  Inguinal hernia None recurrent bilateral inguinal hernia will observe for now  Aortic atherosclerosis ([place]) CT scan finding of aortic atherosclerosis we will follow-up lipid panel at next visit  History of alcohol use Prior history of alcohol use with her cirrhotic liver disease will watch for now as he has had alcohol cessation   Richard Yang was seen today for cirrhosis.  Diagnoses and all orders for this visit:  Alcoholic cirrhosis of liver with ascites ([place])  Umbilical hernia without obstruction and without gangrene  Non-recurrent bilateral inguinal hernia without obstruction or gangrene  Aortic atherosclerosis ([place])  History of  alcohol use    Follow Up Instructions: The patient knows an in office exam will be scheduled in July   I discussed the assessment and treatment plan with the patient. The patient was provided an opportunity to ask questions and all were answered. The patient agreed with the plan and demonstrated an understanding of the instructions.   The patient was advised to call back or seek an in-person evaluation if the symptoms worsen or if the condition fails to improve as anticipated.  I provided 30 minutes of non-face-to-face time during this encounter  including  median intraservice time , review of notes, labs, imaging, medications  and explaining diagnosis and management to the patient .    Asencion Noble, MD

## 2019-07-13 NOTE — Assessment & Plan Note (Signed)
Umbilical hernia with fat and ascites only will observe for now

## 2019-07-24 ENCOUNTER — Other Ambulatory Visit: Payer: Self-pay

## 2019-07-28 NOTE — BH Specialist Note (Signed)
Integrated Behavioral Health Initial Visit  MRN: [id number] Name: Richard Yang  Number of [facility] Clinician visits:: 1/6 Session Start time: 4:15 PM  Session End time: 4:25 PM Total time: 10  Type of Service: [place] Interpretor:No. Interpretor Name and Language: NA   Warm Hand Off Completed.       SUBJECTIVE: Richard Yang is a 64 y.o. male accompanied by self Patient was referred by Dr. Joya Gaskins for substance use. Patient reports the following symptoms/concerns: Pt reports hx of alcohol use resulting in recent hospitalization and diagnosis of cirrhosis of the liver Duration of problem: 2 weeks; Severity of problem: na  OBJECTIVE: Mood: Pleasant and Affect: Appropriate Risk of harm to self or others: No plan to harm self or others  LIFE CONTEXT: Family and Social: Pt receives strong support from family and friends School/Work: Pt is currently uninsured Self-Care: Pt is interested in [employer]  Life Changes: Pt reports hx of alcohol use resulting in recent hospitalization  GOALS ADDRESSED: Patient will: 1. Reduce symptoms of: anxiety 2. Increase knowledge and/or ability of: coping skills and healthy habits  3. Demonstrate ability to: Increase healthy adjustment to current life circumstances, Increase adequate support systems for patient/family and Decrease self-medicating behaviors  INTERVENTIONS: Interventions utilized: Solution-Focused Strategies, Supportive Counseling, Psychoeducation and/or Health Education and Link to [employer]  Standardized Assessments completed: GAD-7 and PHQ 2&9  ASSESSMENT: Patient currently experiencing stress triggered by recent hospitalization and diagnosis of cirrhosis of the liver. Pt shared he has remained sober since discharge, approx 3 weeks ago.   Patient may benefit from linkage to community resources to strengthen support system. LCSW discussed healthy coping skills and provided  substance use resources (both inpatient and outpatient)  PLAN: 1. Follow up with behavioral health clinician on : Contact LCSW with any additional behavioral health and/or resource needs 2. Behavioral recommendations: Utilize strategies and resources provided 3. Referral(s): [facility] (In Clinic) and Substance Abuse Program 4. "From scale of 1-10, how likely are you to follow plan?":   Rebekah [place]field, LCSW 07/28/2019 4:51 PM

## 2019-07-31 MED FILL — FUROSEMIDE 20 MG TABS: 20 | 30 days supply | Qty: 30 | Fill #1

## 2019-08-06 ENCOUNTER — Encounter: Payer: Self-pay | Admitting: Critical Care Medicine

## 2019-08-06 ENCOUNTER — Ambulatory Visit: Payer: Self-pay | Attending: Critical Care Medicine | Admitting: Critical Care Medicine

## 2019-08-06 ENCOUNTER — Other Ambulatory Visit: Payer: Self-pay

## 2019-08-06 VITALS — BP 146/84 | HR 92 | Temp 98.0°F | Resp 16 | Ht 70.0 in | Wt 225.0 lb

## 2019-08-06 DIAGNOSIS — I700 Atherosclerosis of aorta: Secondary | ICD-10-CM

## 2019-08-06 DIAGNOSIS — K766 Portal hypertension: Secondary | ICD-10-CM

## 2019-08-06 DIAGNOSIS — K429 Umbilical hernia without obstruction or gangrene: Secondary | ICD-10-CM

## 2019-08-06 DIAGNOSIS — Z87898 Personal history of other specified conditions: Secondary | ICD-10-CM

## 2019-08-06 DIAGNOSIS — R601 Generalized edema: Secondary | ICD-10-CM | POA: Insufficient documentation

## 2019-08-06 DIAGNOSIS — Z1211 Encounter for screening for malignant neoplasm of colon: Secondary | ICD-10-CM

## 2019-08-06 DIAGNOSIS — K7031 Alcoholic cirrhosis of liver with ascites: Secondary | ICD-10-CM

## 2019-08-06 DIAGNOSIS — I10 Essential (primary) hypertension: Secondary | ICD-10-CM

## 2019-08-06 DIAGNOSIS — Z1159 Encounter for screening for other viral diseases: Secondary | ICD-10-CM

## 2019-08-06 DIAGNOSIS — D696 Thrombocytopenia, unspecified: Secondary | ICD-10-CM

## 2019-08-06 DIAGNOSIS — K4020 Bilateral inguinal hernia, without obstruction or gangrene, not specified as recurrent: Secondary | ICD-10-CM

## 2019-08-06 MED ORDER — NADOLOL 20 MG PO TABS: 20.0000 mg | ORAL_TABLET | Freq: Every day | ORAL | 2 refills | Status: DC

## 2019-08-06 MED ORDER — LACTULOSE 10 GM/15ML PO SOLN: 10.0000 g | Freq: Every day | ORAL | 0 refills | Status: DC

## 2019-08-06 MED FILL — LACTULOSE 10 GM/15 ML SOLN: 10 | 16 days supply | Qty: 236 | Fill #0

## 2019-08-06 MED FILL — NADOLOL 20 MG TAB: 20 | 30 days supply | Qty: 30 | Fill #0

## 2019-08-06 NOTE — Assessment & Plan Note (Signed)
History of alcoholic liver disease with evidence of portal venous hypertension including moderate to large volume of ascites  There is underlying gallstones and umbilical hernia that contains fat and ascites  Note there is bilateral inguinal hernias which are asymptomatic and aortic atherosclerosis on the CT scan from May 2021  There is reduction in ascites with the use of furosemide however Aldactone had to be discontinued  Plan will be continued alcohol abstinence, initiation of nadolol 20 mg daily in addition to continuing furosemide 20 mg daily  Plan will also be to add lactulose 15 mL daily to improve bowel flow and to check ammonia level repeat metabolic panel hepatitis C assay and AFP tumor marker  Will refer to gastroenterology and get the patient on patient assistance in order to see specialists

## 2019-08-06 NOTE — Assessment & Plan Note (Signed)
Patient states he has been abstinent since early May

## 2019-08-06 NOTE — Assessment & Plan Note (Signed)
We will check lipid panel

## 2019-08-06 NOTE — Progress Notes (Signed)
Subjective:    Patient ID: Richard Yang, male    DOB: 19-Apr-1955, 64 y.o.   MRN: [id number] History  64 y.o.M  Hx of ETOH cirrohsis, HTN to establish PCP care   This is a telephone visit follow-up from a May visit with primary care. The patient has history of alcohol induced cirrhotic changes based on emergency room visit in May.  Patient also has hypertension as well.  At the last visit the patient was given Aldactone and Lasix and he has a home blood pressure cuff.  He states his blood pressure now is anywhere from 120/76-130/78.  The patient states the edema in the lower extremities and abdomen are better.  He states he did have side effects on Aldactone is taking furosemide only at this time.  He is no longer been drinking alcohol since Mother's Day in May of this year.  His abdominal girth is gone down.  He does not weigh himself we do not know what his weight actually is.  The patient is thinking about getting a Covid vaccine but is yet to achieve this.  08/06/2019 Patient is seen in return follow-up and this is a face-to-face visit from the last visit which was a telephone visit.  Patient is here to establish further for primary care and follow-up for alcoholic liver cirrhosis with portal hypertension.  The patient does have moderate ascites with umbilical hernia.  Patient also has history of gallstones asymptomatic and atherosclerosis of the aorta from abdominal CT scan done in early May 2021 with an emergency room visit.  Patient initially was placed on Aldactone and furosemide but he had side effects with the Aldactone and had to stop this medication.  He is on furosemide only at 20 mg daily this is his only medication.  Note he does have increased urination but has lost 15 pounds of weight since May and has had reduction in the tense nature of the ascites of the abdomen.  Note there is documentation of prior history of thrombocytopenia and this was documented in May.  Has not yet been seen by  gastroenterology does not have any health insurance.  He has received the first of his during a Covid vaccine series and we have held off on Pneumovax and tetanus until the Covid vaccine series is completed.  His next vaccine is due next Monday. Patient does have hypertension with this and is only on the furosemide and yet with this persistent his hypertensive numbers.  Patient is also due up a hepatitis C screen and colonoscopy  Patient has note some constipation as well.  Patient denies any bleeding from his lower or upper GI tracts.  Patient states the edema in his lower extremities are improved however he still has scrotal edema  Wt Readings from Last 3 Encounters: 08/06/19 : 225 lb (102.1 kg) 06/24/19 : 240 lb (108.9 kg) 09/22/17 : 198 lb (89.8 kg)  No past medical history on file.   No family history on file.   Social History   Socioeconomic History  . Marital status: Single    Spouse name: Not on file  . Number of children: Not on file  . Years of education: Not on file  . Highest education level: Not on file  Occupational History  . Not on file  Tobacco Use  . Smoking status: Never Smoker  . Smokeless tobacco: Never Used  Substance and Sexual Activity  . Alcohol use: Not Currently  . Drug use: Not Currently  . Sexual  activity: Not on file  Other Topics Concern  . Not on file  Social History Narrative  . Not on file   Social Determinants of Health   Financial Resource Strain:   . Difficulty of Paying Living Expenses:   Food Insecurity:   . Worried About Charity fundraiser in the Last Year:   . Arboriculturist in the Last Year:   Transportation Needs:   . Film/video editor (Medical):   Marland Kitchen Lack of Transportation (Non-Medical):   Physical Activity:   . Days of Exercise per Week:   . Minutes of Exercise per Session:   Stress:   . Feeling of Stress :   Social Connections:   . Frequency of Communication with Friends and Family:   . Frequency of Social  Gatherings with Friends and Family:   . Attends Religious Services:   . Active Member of Clubs or Organizations:   . Attends Archivist Meetings:   Marland Kitchen Marital Status:   Intimate Partner Violence:   . Fear of Current or Ex-Partner:   . Emotionally Abused:   Marland Kitchen Physically Abused:   . Sexually Abused:      No Known Allergies   Outpatient Medications Prior to Visit  Medication Sig Dispense Refill  . furosemide (LASIX) 20 MG tablet Take 1 tablet (20 mg total) by mouth daily. 90 tablet 3   No facility-administered medications prior to visit.    Review of Systems  Constitutional: Positive for activity change and fatigue.  HENT: Negative.   Respiratory: Negative.   Cardiovascular: Positive for leg swelling. Negative for chest pain and palpitations.       Edema in LE. abd ascites  Gastrointestinal: Positive for abdominal distention and constipation. Negative for anal bleeding and blood in stool.  Endocrine: Positive for polyuria.  Genitourinary: Positive for enuresis and frequency. Negative for hematuria.  Musculoskeletal: Negative.   Neurological: Negative.   Psychiatric/Behavioral: Negative.        Objective:   Physical Exam Vitals:   08/06/19 0842  BP: (!) 146/84  Pulse: 92  Resp: 16  Temp: 98 F (36.7 C)  SpO2: 96%  Weight: 225 lb (102.1 kg)  Height: 5' 10"  (1.778 m)    Gen: Pleasant, well-nourished, in no distress,  normal affect  ENT: No lesions,  mouth clear,  oropharynx clear, no postnasal drip  Neck: No JVD, no TMG, no carotid bruits  Lungs: No use of accessory muscles, no dullness to percussion, clear without rales or rhonchi  Cardiovascular: RRR, heart sounds normal, no murmur or gallops, 2+ peripheral edema  Abdomen: Ascites present with mild umbilical hernia which reduces easily there is a positive fluid wave and this ascites appears to be improved from prior exams in May, liver is not enlarged the spleen seems to be enlarged on palpitation,  there is moderate scrotal edema but it is not tense  Musculoskeletal: No deformities, no cyanosis or clubbing  Neuro: alert, non focal  Skin: Warm, no lesions or rashes  Hepatic Function Latest Ref Rng & Units 06/18/2019  Total Protein 6.5 - 8.1 g/dL 7.8  Albumin 3.5 - 5.0 g/dL 2.6(L)  AST 15 - 41 U/L 114(H)  ALT 0 - 44 U/L 60(H)  Alk Phosphatase 38 - 126 U/L 90  Total Bilirubin 0.3 - 1.2 mg/dL 3.3(H)   BMP Latest Ref Rng & Units 06/18/2019 12/16/2015 11/18/2015  Glucose 70 - 99 mg/dL 101(H) 89 113(H)  BUN 8 - 23 mg/dL 15 21 12  Creatinine 0.61 - 1.24 mg/dL 1.28(H) 1.19 1.11  Sodium 135 - 145 mmol/L 137 139 138  Potassium 3.5 - 5.1 mmol/L 3.9 3.9 3.9  Chloride 98 - 111 mmol/L 103 105 105  CO2 22 - 32 mmol/L 23 25 25   Calcium 8.9 - 10.3 mg/dL 8.8(L) 9.3 9.0   CBC Latest Ref Rng & Units 06/18/2019 12/16/2015 11/18/2015  WBC 4.0 - 10.5 K/uL 5.0 5.1 4.5  Hemoglobin 13.0 - 17.0 g/dL 12.3(L) 16.1 15.2  Hematocrit 39 - 52 % 35.5(L) 47.6 44.9  Platelets 150 - 400 K/uL 67(L) 103(L) 100(L)         Assessment & Plan:  I personally reviewed all images and lab data in the [facility] system as well as any outside material available during this office visit and agree with the  radiology impressions.   Liver cirrhosis, alcoholic (HCC) History of alcoholic liver disease with evidence of portal venous hypertension including moderate to large volume of ascites  There is underlying gallstones and umbilical hernia that contains fat and ascites  Note there is bilateral inguinal hernias which are asymptomatic and aortic atherosclerosis on the CT scan from May 2021  There is reduction in ascites with the use of furosemide however Aldactone had to be discontinued  Plan will be continued alcohol abstinence, initiation of nadolol 20 mg daily in addition to continuing furosemide 20 mg daily  Plan will also be to add lactulose 15 mL daily to improve bowel flow and to check ammonia level repeat metabolic  panel hepatitis C assay and AFP tumor marker  Will refer to gastroenterology and get the patient on patient assistance in order to see specialists    Generalized edema Generalized edema on the basis of liver disease  History of alcohol use Patient states he has been abstinent since early May  Inguinal hernia Inguinal hernias are stable will observe, umbilical hernia is stable will observe  Umbilical hernia This is stable will observe  Thrombocytopenia (HCC) 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 we will repeat platelet count and observe  Aortic atherosclerosis (HCC) We will check lipid panel  Hypertension Plan to add nadolol 20 mg daily to furosemide 20 mg daily  Portal hypertension ([place]) Portal hypertension on the basis of liver disease plan referral to gastroenterology and add nadolol   Diagnoses and all orders for this visit:  Alcoholic cirrhosis of liver with ascites ([place]) -     CBC with Differential/Platelet -     Ammonia -     AFP tumor marker -     Ambulatory referral to Gastroenterology  Generalized edema -     Thyroid Panel With TSH -     Ambulatory referral to Gastroenterology  Essential hypertension  Aortic atherosclerosis ([place]) -     Lipid panel -     Comprehensive metabolic panel  Thrombocytopenia ([place]) -     CBC with Differential/Platelet  Colon cancer screening -     Fecal occult blood, imunochemical  Need for hepatitis C screening test -     Hepatitis c antibody (reflex)  Portal hypertension ([place]) -     Ambulatory referral to Gastroenterology  History of alcohol use  Non-recurrent bilateral inguinal hernia without obstruction or gangrene  Umbilical hernia without obstruction and without gangrene  Other orders -     lactulose (CHRONULAC) 10 GM/15ML solution; Take 15 mLs (10 g total) by mouth daily. -     nadolol (CORGARD) 20 MG tablet; Take 1 tablet (20 mg total) by mouth  daily.

## 2019-08-06 NOTE — Assessment & Plan Note (Signed)
This is stable will observe

## 2019-08-06 NOTE — Assessment & Plan Note (Addendum)
Portal hypertension on the basis of liver disease plan referral to gastroenterology and add nadolol

## 2019-08-06 NOTE — Patient Instructions (Signed)
Start nadolol one daily for blood pressure Stay on furosemide Start lactulose one daily 15ML for constipation Labs today : metabolic panel, lipid and thyroid panel, blood counts, ammonia level, hep C   Focus on low salt diet, see below  No alcohol  A referral to gastroenterology will be made  See Dr Joya Gaskins in 6 weeks  Obtain finanical assistance paperwork   Low-Sodium Eating Plan Sodium, which is an element that makes up salt, helps you maintain a healthy balance of fluids in your body. Too much sodium can increase your blood pressure and cause fluid and waste to be held in your body. Your health care provider or dietitian may recommend following this plan if you have high blood pressure (hypertension), kidney disease, liver disease, or heart failure. Eating less sodium can help lower your blood pressure, reduce swelling, and protect your heart, liver, and kidneys. What are tips for following this plan? General guidelines  Most people on this plan should limit their sodium intake to 1,500-2,000 mg (milligrams) of sodium each day. Reading food labels   The Nutrition Facts label lists the amount of sodium in one serving of the food. If you eat more than one serving, you must multiply the listed amount of sodium by the number of servings.  Choose foods with less than 140 mg of sodium per serving.  Avoid foods with 300 mg of sodium or more per serving. Shopping  Look for lower-sodium products, often labeled as "low-sodium" or "no salt added."  Always check the sodium content even if foods are labeled as "unsalted" or "no salt added".  Buy fresh foods. ? Avoid canned foods and premade or frozen meals. ? Avoid canned, cured, or processed meats  Buy breads that have less than 80 mg of sodium per slice. Cooking  Eat more home-cooked food and less restaurant, buffet, and fast food.  Avoid adding salt when cooking. Use salt-free seasonings or herbs instead of table salt or sea  salt. Check with your health care provider or pharmacist before using salt substitutes.  Cook with plant-based oils, such as canola, sunflower, or olive oil. Meal planning  When eating at a restaurant, ask that your food be prepared with less salt or no salt, if possible.  Avoid foods that contain MSG (monosodium glutamate). MSG is sometimes added to [place] food, bouillon, and some canned foods. What foods are recommended? The items listed may not be a complete list. Talk with your dietitian about what dietary choices are best for you. Grains Low-sodium cereals, including oats, puffed wheat and rice, and shredded wheat. Low-sodium crackers. Unsalted rice. Unsalted pasta. Low-sodium bread. Whole-grain breads and whole-grain pasta. Vegetables Fresh or frozen vegetables. "No salt added" canned vegetables. "No salt added" tomato sauce and paste. Low-sodium or reduced-sodium tomato and vegetable juice. Fruits Fresh, frozen, or canned fruit. Fruit juice. Meats and other protein foods Fresh or frozen (no salt added) meat, poultry, seafood, and fish. Low-sodium canned tuna and salmon. Unsalted nuts. Dried peas, beans, and lentils without added salt. Unsalted canned beans. Eggs. Unsalted nut butters. Dairy Milk. Soy milk. Cheese that is naturally low in sodium, such as ricotta cheese, fresh mozzarella, or Swiss cheese Low-sodium or reduced-sodium cheese. Cream cheese. Yogurt. Fats and oils Unsalted butter. Unsalted margarine with no trans fat. Vegetable oils such as canola or olive oils. Seasonings and other foods Fresh and dried herbs and spices. Salt-free seasonings. Low-sodium mustard and ketchup. Sodium-free salad dressing. Sodium-free light mayonnaise. Fresh or refrigerated horseradish. Lemon juice. Vinegar.  Homemade, reduced-sodium, or low-sodium soups. Unsalted popcorn and pretzels. Low-salt or salt-free chips. What foods are not recommended? The items listed may not be a complete list. Talk  with your dietitian about what dietary choices are best for you. Grains Instant hot cereals. Bread stuffing, pancake, and biscuit mixes. Croutons. Seasoned rice or pasta mixes. Noodle soup cups. Boxed or frozen macaroni and cheese. Regular salted crackers. Self-rising flour. Vegetables Sauerkraut, pickled vegetables, and relishes. Olives. [place] fries. Onion rings. Regular canned vegetables (not low-sodium or reduced-sodium). Regular canned tomato sauce and paste (not low-sodium or reduced-sodium). Regular tomato and vegetable juice (not low-sodium or reduced-sodium). Frozen vegetables in sauces. Meats and other protein foods Meat or fish that is salted, canned, smoked, spiced, or pickled. Bacon, ham, sausage, hotdogs, corned beef, chipped beef, packaged lunch meats, salt pork, jerky, pickled herring, anchovies, regular canned tuna, sardines, salted nuts. Dairy Processed cheese and cheese spreads. Cheese curds. Blue cheese. Feta cheese. String cheese. Regular cottage cheese. Buttermilk. Canned milk. Fats and oils Salted butter. Regular margarine. Ghee. Bacon fat. Seasonings and other foods Onion salt, garlic salt, seasoned salt, table salt, and sea salt. Canned and packaged gravies. Worcestershire sauce. Tartar sauce. Barbecue sauce. Teriyaki sauce. Soy sauce, including reduced-sodium. Steak sauce. Fish sauce. Oyster sauce. Cocktail sauce. Horseradish that you find on the shelf. Regular ketchup and mustard. Meat flavorings and tenderizers. Bouillon cubes. Hot sauce and Tabasco sauce. Premade or packaged marinades. Premade or packaged taco seasonings. Relishes. Regular salad dressings. Salsa. Potato and tortilla chips. Corn chips and puffs. Salted popcorn and pretzels. Canned or dried soups. Pizza. Frozen entrees and pot pies. Summary  Eating less sodium can help lower your blood pressure, reduce swelling, and protect your heart, liver, and kidneys.  Most people on this plan should limit their sodium  intake to 1,500-2,000 mg (milligrams) of sodium each day.  Canned, boxed, and frozen foods are high in sodium. Restaurant foods, fast foods, and pizza are also very high in sodium. You also get sodium by adding salt to food.  Try to cook at home, eat more fresh fruits and vegetables, and eat less fast food, canned, processed, or prepared foods. This information is not intended to replace advice given to you by your health care provider. Make sure you discuss any questions you have with your health care provider. Document Revised: 01/04/2017 Document Reviewed: 01/16/2016 Elsevier Patient Education  2020 [employer].

## 2019-08-06 NOTE — Progress Notes (Signed)
In office appt establish care with PCP

## 2019-08-06 NOTE — Assessment & Plan Note (Signed)
Plan to add nadolol 20 mg daily to furosemide 20 mg daily

## 2019-08-06 NOTE — Assessment & Plan Note (Signed)
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

## 2019-08-06 NOTE — Assessment & Plan Note (Signed)
Generalized edema on the basis of liver disease

## 2019-08-06 NOTE — Assessment & Plan Note (Signed)
Inguinal hernias are stable will observe, umbilical hernia is stable will observe

## 2019-08-07 ENCOUNTER — Telehealth: Payer: Self-pay | Admitting: *Deleted

## 2019-08-07 ENCOUNTER — Other Ambulatory Visit: Payer: Self-pay | Admitting: Critical Care Medicine

## 2019-08-07 DIAGNOSIS — K7031 Alcoholic cirrhosis of liver with ascites: Secondary | ICD-10-CM

## 2019-08-07 MED ORDER — ATORVASTATIN CALCIUM 20 MG PO TABS: 20.0000 mg | ORAL_TABLET | Freq: Every day | ORAL | 3 refills | Status: DC

## 2019-08-07 MED FILL — ATORVASTATIN CALCIUM 20 MG: 20 | 30 days supply | Qty: 30 | Fill #0

## 2019-08-07 NOTE — Telephone Encounter (Signed)
For you information Representative from [facility] called to inform the lab code [phone] was non orderable as of 08/06/2019.   Would need to use [id number] or [id number] to continue to process the patient's specimen.

## 2019-08-08 LAB — COMPREHENSIVE METABOLIC PANEL
ALT: 36 IU/L (ref 0–44)
AST: 71 IU/L — ABNORMAL HIGH (ref 0–40)
Albumin/Globulin Ratio: 0.6 — ABNORMAL LOW (ref 1.2–2.2)
Albumin: 2.7 g/dL — ABNORMAL LOW (ref 3.8–4.8)
Alkaline Phosphatase: 98 IU/L (ref 48–121)
BUN/Creatinine Ratio: 15 (ref 10–24)
BUN: 17 mg/dL (ref 8–27)
Bilirubin Total: 2.2 mg/dL — ABNORMAL HIGH (ref 0.0–1.2)
CO2: 22 mmol/L (ref 20–29)
Calcium: 8.6 mg/dL (ref 8.6–10.2)
Chloride: 103 mmol/L (ref 96–106)
Creatinine, Ser: 1.13 mg/dL (ref 0.76–1.27)
GFR calc Af Amer: 78 mL/min/{1.73_m2} (ref >59)
GFR calc non Af Amer: 68 mL/min/{1.73_m2} (ref >59)
Globulin, Total: 4.9 g/dL — ABNORMAL HIGH (ref 1.5–4.5)
Glucose: 98 mg/dL (ref 65–99)
Potassium: 4.3 mmol/L (ref 3.5–5.2)
Sodium: 134 mmol/L (ref 134–144)
Total Protein: 7.6 g/dL (ref 6.0–8.5)

## 2019-08-08 LAB — LIPID PANEL
Chol/HDL Ratio: 9.1 ratio — ABNORMAL HIGH (ref 0.0–5.0)
Cholesterol, Total: 246 mg/dL — ABNORMAL HIGH (ref 100–199)
HDL: 27 mg/dL — ABNORMAL LOW (ref >39)
LDL Chol Calc (NIH): 187 mg/dL — ABNORMAL HIGH (ref 0–99)
Triglycerides: 169 mg/dL — ABNORMAL HIGH (ref 0–149)
VLDL Cholesterol Cal: 32 mg/dL (ref 5–40)

## 2019-08-08 LAB — CBC WITH DIFFERENTIAL/PLATELET
Basophils Absolute: 0 10*3/uL (ref 0.0–0.2)
Basos: 0 %
EOS (ABSOLUTE): 0.1 10*3/uL (ref 0.0–0.4)
Eos: 2 %
Hematocrit: 33.2 % — ABNORMAL LOW (ref 37.5–51.0)
Hemoglobin: 12.5 g/dL — ABNORMAL LOW (ref 13.0–17.7)
Immature Grans (Abs): 0 10*3/uL (ref 0.0–0.1)
Immature Granulocytes: 0 %
Lymphocytes Absolute: 1.9 10*3/uL (ref 0.7–3.1)
Lymphs: 37 %
MCH: 33.5 pg — ABNORMAL HIGH (ref 26.6–33.0)
MCHC: 37.7 g/dL — ABNORMAL HIGH (ref 31.5–35.7)
MCV: 89 fL (ref 79–97)
Monocytes Absolute: 0.6 10*3/uL (ref 0.1–0.9)
Monocytes: 12 %
Neutrophils Absolute: 2.5 10*3/uL (ref 1.4–7.0)
Neutrophils: 49 %
Platelets: 72 10*3/uL — CL (ref 150–450)
RBC: 3.73 x10E6/uL — ABNORMAL LOW (ref 4.14–5.80)
RDW: 13.6 % (ref 11.6–15.4)
WBC: 5.1 10*3/uL (ref 3.4–10.8)

## 2019-08-08 LAB — AFP TUMOR MARKER: AFP, Serum, Tumor Marker: 26.3 ng/mL — ABNORMAL HIGH (ref 0.0–8.3)

## 2019-08-08 LAB — THYROID PANEL WITH TSH
Free Thyroxine Index: 2.1 (ref 1.2–4.9)
T3 Uptake Ratio: 33 % (ref 24–39)
T4, Total: 6.5 ug/dL (ref 4.5–12.0)
TSH: 2.82 u[IU]/mL (ref 0.450–4.500)

## 2019-08-08 LAB — AMMONIA: Ammonia: 49 ug/dL (ref 40–200)

## 2019-08-08 LAB — SPECIMEN STATUS REPORT

## 2019-08-10 ENCOUNTER — Ambulatory Visit: Payer: Self-pay | Attending: Internal Medicine

## 2019-08-10 DIAGNOSIS — Z23 Encounter for immunization: Secondary | ICD-10-CM

## 2019-08-10 NOTE — Telephone Encounter (Signed)
New order entered

## 2019-08-10 NOTE — Progress Notes (Signed)
   Covid-19 Vaccination Clinic  Name:  Richard Yang    MRN: [id number] DOB: 11-13-1955  08/10/2019  Richard Yang was observed post Covid-19 immunization for 15 minutes without incident. He was provided with Vaccine Information Sheet and instruction to access the V-Safe system.   Richard Yang was instructed to call 911 with any severe reactions post vaccine: Marland Kitchen Difficulty breathing  . Swelling of face and throat  . A fast heartbeat  . A bad rash all over body  . Dizziness and weakness   Immunizations Administered    Name Date Dose VIS Date Route   Moderna COVID-19 Vaccine 08/10/2019  1:45 PM 0.5 mL 01/2019 Intramuscular   Manufacturer: Moderna   Lot: [id number]   [place]: [id number]

## 2019-08-12 ENCOUNTER — Other Ambulatory Visit: Payer: Self-pay | Admitting: Critical Care Medicine

## 2019-08-12 DIAGNOSIS — R768 Other specified abnormal immunological findings in serum: Secondary | ICD-10-CM

## 2019-08-12 LAB — SPECIMEN STATUS REPORT

## 2019-08-12 LAB — HCV RT-PCR, QUANT (NON-GRAPH)

## 2019-08-12 LAB — HCV AB W REFLEX TO QUANT PCR: HCV Ab: >11 s/co ratio — ABNORMAL HIGH (ref 0.0–0.9)

## 2019-08-13 ENCOUNTER — Encounter: Payer: Self-pay | Admitting: Nurse Practitioner

## 2019-09-01 MED FILL — FUROSEMIDE 20 MG TABS: 20 | 30 days supply | Qty: 30 | Fill #2

## 2019-09-15 ENCOUNTER — Ambulatory Visit (INDEPENDENT_AMBULATORY_CARE_PROVIDER_SITE_OTHER): Payer: Self-pay | Admitting: Nurse Practitioner

## 2019-09-15 ENCOUNTER — Other Ambulatory Visit (INDEPENDENT_AMBULATORY_CARE_PROVIDER_SITE_OTHER): Payer: Self-pay

## 2019-09-15 ENCOUNTER — Encounter: Payer: Self-pay | Admitting: Nurse Practitioner

## 2019-09-15 VITALS — BP 134/74 | HR 110 | Ht 71.0 in | Wt 242.0 lb

## 2019-09-15 DIAGNOSIS — K7460 Unspecified cirrhosis of liver: Secondary | ICD-10-CM

## 2019-09-15 DIAGNOSIS — R772 Abnormality of alphafetoprotein: Secondary | ICD-10-CM

## 2019-09-15 DIAGNOSIS — R768 Other specified abnormal immunological findings in serum: Secondary | ICD-10-CM

## 2019-09-15 DIAGNOSIS — F1010 Alcohol abuse, uncomplicated: Secondary | ICD-10-CM

## 2019-09-15 DIAGNOSIS — R7689 Other specified abnormal immunological findings in serum: Secondary | ICD-10-CM

## 2019-09-15 LAB — CBC WITH DIFFERENTIAL/PLATELET
Basophils Absolute: 0 10*3/uL (ref 0.0–0.1)
Basophils Relative: 0.6 % (ref 0.0–3.0)
Eosinophils Absolute: 0.1 10*3/uL (ref 0.0–0.7)
Eosinophils Relative: 2.7 % (ref 0.0–5.0)
HCT: 35.2 % — ABNORMAL LOW (ref 39.0–52.0)
Hemoglobin: 12.1 g/dL — ABNORMAL LOW (ref 13.0–17.0)
Lymphocytes Relative: 27.2 % (ref 12.0–46.0)
Lymphs Abs: 1.4 10*3/uL (ref 0.7–4.0)
MCHC: 34.5 g/dL (ref 30.0–36.0)
MCV: 97.6 fl (ref 78.0–100.0)
Monocytes Absolute: 0.5 10*3/uL (ref 0.1–1.0)
Monocytes Relative: 9.4 % (ref 3.0–12.0)
Neutro Abs: 3.1 10*3/uL (ref 1.4–7.7)
Neutrophils Relative %: 60.1 % (ref 43.0–77.0)
Platelets: 91 10*3/uL — ABNORMAL LOW (ref 150.0–400.0)
RBC: 3.61 Mil/uL — ABNORMAL LOW (ref 4.22–5.81)
RDW: 17.3 % — ABNORMAL HIGH (ref 11.5–15.5)
WBC: 5.2 10*3/uL (ref 4.0–10.5)

## 2019-09-15 LAB — HEPATIC FUNCTION PANEL
ALT: 25 U/L (ref 0–53)
AST: 48 U/L — ABNORMAL HIGH (ref 0–37)
Albumin: 2.7 g/dL — ABNORMAL LOW (ref 3.5–5.2)
Alkaline Phosphatase: 82 U/L (ref 39–117)
Bilirubin, Direct: 0.9 mg/dL — ABNORMAL HIGH (ref 0.0–0.3)
Total Bilirubin: 2.3 mg/dL — ABNORMAL HIGH (ref 0.2–1.2)
Total Protein: 8.4 g/dL — ABNORMAL HIGH (ref 6.0–8.3)

## 2019-09-15 LAB — FERRITIN: Ferritin: 1370.3 ng/mL — ABNORMAL HIGH (ref 22.0–322.0)

## 2019-09-15 LAB — BASIC METABOLIC PANEL
BUN: 15 mg/dL (ref 6–23)
CO2: 26 mEq/L (ref 19–32)
Calcium: 9 mg/dL (ref 8.4–10.5)
Chloride: 101 mEq/L (ref 96–112)
Creatinine, Ser: 1.12 mg/dL (ref 0.40–1.50)
GFR: 79.57 mL/min (ref >60.00)
Glucose, Bld: 93 mg/dL (ref 70–99)
Potassium: 3.8 mEq/L (ref 3.5–5.1)
Sodium: 135 mEq/L (ref 135–145)

## 2019-09-15 LAB — PROTIME-INR
INR: 1.3 ratio — ABNORMAL HIGH (ref 0.8–1.0)
Prothrombin Time: 14 s — ABNORMAL HIGH (ref 9.6–13.1)

## 2019-09-15 LAB — IBC PANEL
Iron: 92 ug/dL (ref 42–165)
Saturation Ratios: 42.4 % (ref 20.0–50.0)
Transferrin: 155 mg/dL — ABNORMAL LOW (ref 212.0–360.0)

## 2019-09-15 NOTE — Progress Notes (Signed)
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 He was recently diagnosed with decompensated cirrhosis with ascites.  He was referred to our office by Dr. Joya Gaskins for further cirrhosis evaluation.  He presented to [facility] ED 06/18/2019 due to having scrotal pain and bilateral lower extremity edema. His lower extremity edema started in March 2021. Labs in the ED showed WBC 5.0.  Hg 12.3. AST 114. ALT 60. PLT 67. An abdominal/pelvic CT scan showed evidence of cirrhosis, portal hypertension with moderate to large volume of ascites. Bilateral inguinal hernias, an umbilical hernia and gallstones were also noted.  He was diagnosed with EtOH cirrhosis . He reported possibly having hepatitis C in the past. A paracentesis was not done.  He did not fit requirements for admission.  He was discharged home with instructions to follow-up with his primary care physician and he was prescribed Naproxen for his scrotal pain.  The patient is uninsured.  He is applied for [facility] health financial assistance.  He was seen in the office by Dr. Phoebe Sharps on 06/24/2019.  Weight 240lbs. He was started on Furosemide 8m QD and Spironolactone 539mQD with plans to have repeat laboratory studies done in 2 weeks.  He underwent a televisit with Dr. PaAsencion Noblen 07/13/2019.  At that time, the patient reported his abdominal and lower extremity edema were improved.  He reported his weight was down but he did not have a scale to obtain a true weight.  The patient stated he did not tolerate the Aldactone so he stopped it.  He remained abstinent from alcohol since his hospital admission.  He was instructed to continue Furosemide 20 mg daily.  He was seen in face-to-face by Dr. WrJoya Gaskinsn the office on 08/06/2019.   Weight 225lbs. He was prescribed Nadolol 20 mg daily and Lactulose 15 mL daily for constipation.  Labs showed a WBC 5.1.  Hemoglobin 12.5.  Platelets 72.  Total bili 2.2.  Alk phos 98.  AST 71.  ALT 36.  AFP 26.3. Hepatitis C antibody positive. Hepatitis C RNA quant was ordered but the specimen was inadequate for the lab to complete.  He was referred to our office for further GI/hepatology evaluation.  Richard Yang having any change in mental status or confusion.  His appetite is good.  No nausea or vomiting.  No heartburn or dysphagia.  No upper or lower abdominal pain.  He is passing normal brown bowel movement daily.  He is no longer constipated and he stopped taking the Lactulose.  He has loose stools if he eats a lot of greens or vegetables.  No rectal bleeding or black stools.  He reports losing 10 to 15 pounds since May 2021. He stopped taking Nadolol, he felt his leg swelling worsened when he took it.  He takes Advil 200 mg 2 tabs once or twice weekly for aches and pains.  He reports drinking heavily since the age of 89 He initially drank hard liquor, however, for the past 7 years he drinks approximately 1-1/2 bottles of wine daily.  He last wine consumed wine on 06/14/2019.  He stopped drinking alcohol since his visit to the ED 06/18/2019, however, he reported slipping and drank a lemonade spritzer last weekend. History of IV drug use  in the 1970's. He reports being gay. He's never had an EGD or screening colonoscopy.  No known family history of esophageal, gastric, colon or liver cancer. No history of heart disease.   CBC Latest Ref Rng & Units 08/06/2019 06/18/2019 12/16/2015  WBC 3.4 - 10.8 x10E3/uL 5.1 5.0 5.1  Hemoglobin 13.0 - 17.7 g/dL 12.5(L) 12.3(L) 16.1  Hematocrit 37.5 - 51.0 % 33.2(L) 35.5(L) 47.6  Platelets 150 - 450 x10E3/uL 72(LL) 67(L) 103(L)    CMP Latest Ref Rng & Units 08/06/2019 06/18/2019 12/16/2015  Glucose 65 - 99 mg/dL 98 101(H) 89  BUN 8 - 27 mg/dL 17 15 21     Creatinine 0.76 - 1.27 mg/dL 1.13 1.28(H) 1.19  Sodium 134 - 144 mmol/L 134 137 139  Potassium 3.5 - 5.2 mmol/L 4.3 3.9 3.9  Chloride 96 - 106 mmol/L 103 103 105  CO2 20 - 29 mmol/L 22 23 25   Calcium 8.6 - 10.2 mg/dL 8.6 8.8(L) 9.3  Total Protein 6.0 - 8.5 g/dL 7.6 7.8 -  Total Bilirubin 0.0 - 1.2 mg/dL 2.2(H) 3.3(H) -  Alkaline Phos 48 - 121 IU/L 98 90 -  AST 0 - 40 IU/L 71(H) 114(H) -  ALT 0 - 44 IU/L 36 60(H) -   Hep C antibody > 11  Abdominal/pelvic CT scan: 1. Morphologic features of the liver compatible with cirrhosis. Stigmata of portal venous hypertension including moderate to large volume of ascites. 2. Gallstones. 3. Bilateral inguinal hernias. 4. Umbilical hernia contains fat as well as ascites. 5. Aortic atherosclerosis.  Social History: He lives by himself. He is gay. He is unemployed. Nonsmoker. See HPI.   Family History: Father died when he was a child,  details unknown. Mother died, history  unknown. Brother and sister relatively healthy.   No Known Allergies    Outpatient Encounter Medications as of 09/15/2019  Medication Sig  . atorvastatin (LIPITOR) 20 MG tablet Take 1 tablet (20 mg total) by mouth daily.  . furosemide (LASIX) 20 MG tablet Take 1 tablet (20 mg total) by mouth daily.  Marland Kitchen lactulose (CHRONULAC) 10 GM/15ML solution Take 15 mLs (10 g total) by mouth daily.  . nadolol (CORGARD) 20 MG tablet Take 1 tablet (20 mg total) by mouth daily.   No facility-administered encounter medications on file as of 09/15/2019.     REVIEW OF SYSTEMS:  Gen: Denies fever, sweats or chills. CV: Denies chest pain, palpitations or edema. Resp: Denies cough, shortness of breath of hemoptysis.  GI: See HPI. GU : Denies urinary burning, blood in urine, increased urinary frequency or incontinence. MS: Denies joint pain, muscles aches or weakness.  Derm: Denies rash, itchiness, skin lesions or unhealing ulcers. Psych: Denies depression, anxiety, memory loss or  confusion. Heme: Denies bruising, bleeding. Neuro:  Denies headaches, dizziness or paresthesias. Endo:  Denies any problems with DM, thyroid or adrenal function.   PHYSICAL EXAM: BP 134/74   Pulse (!) 110   Ht 5' 11"  (1.803 m)   Wt 242 lb (109.8 kg)   BMI 33.75 kg/m   Wt Readings from Last 3 Encounters:  09/15/19 242 lb (109.8 kg)  08/06/19 225 lb (102.1 kg)  06/24/19 240 lb (108.9 kg)   General: Well developed  64 year old male in no acute distress. Head: Normocephalic and atraumatic. Eyes:  Sclerae non-icteric, conjunctive pink. Ears: Normal auditory acuity. Mouth: Dentition intact. No ulcers or lesions.  Neck: Supple, no lymphadenopathy or thyromegaly.  Lungs: Clear bilaterally to auscultation without wheezes, crackles or rhonchi. Heart:  Regular rate and rhythm. No murmur, rub or gallop appreciated.  Abdomen: Distended, + ascites moderately tense + anasarca to lower abdomen. Nontender. Umbilical hernia. No masses. No hepatosplenomegaly. Normoactive bowel sounds x 4 quadrants.  Rectal: Deferred.  Musculoskeletal: Symmetrical with no gross deformities. Skin: Warm and dry. No rash or lesions on visible extremities. Extremities: Anasarca pitting edema bilaterally  from feet to lower abdomen.  Neurological: Alert oriented x 4, no focal deficits.  Psychological:  Alert and cooperative. Normal mood and affect.  ASSESSMENT AND PLAN:  11. 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 No signs of overt hepatic encephalopathy. Hep C antibody +. Elevated AFP 26.3.  At risk for [facility]. MELD score to be calculated after INR level obtained.  -Hep C RNA quant, Hep C genotype, HIV, CBC, hepatic panel, BMP, PT/INR, Hep A total ab, Hep B surface antigen, Hep B surface antibody, Hep B core total ab, Hep C antibody, ceruloplasmin, ANA, SMA, AMA, IgG, Iron, Ferritin, B12 and folate levels -Continue Furosemide 54m QD for now. I will increase his Furosemide  dose and restart Spironolactone after I review the above lab results.  -If he does not have immunity to Hep A and Hep B he will require these vaccinations -Sono guided paracentesis, paracentesis labs to include cell count with diff, gram stain, albumin, protein, aerobic and anaerobic cultures and cytology. Albumin IV per radiology protocol.  -Abdominal MRI with contrast to rule out hepatoma to be scheduled when financial assistance received  -No NSAIDs -EGD to survey for esophageal/gastric varices, to be scheduled when financial assistance received  -No alcohol ever discussed with the patient. Advised outpatient alcohol rehab/AA -2 gm low sodium diet  -Await Hep C RNA quant and genotype result, will need referral to ID if chronic hepatitis C confirmed  2. Normocytic anemia -check CBC, iron panel and Vitamin B12 level  3. Thrombocytopenia secondary to cirrhosis. CTAP with evidence of portal  HTN and splenomegaly.   4. Colon cancer screening -Colonoscopy at the time of EGD  5.  Anasarca secondary to #1 and hypoalbuminemia.  Albumin level  2.7. -Recommend scheduling an ECHO  to rule out right-sided heart failure once financial assistance received  Patient to follow up in the office with Dr. MRush Landmarkafter 2 to 3 weeks after completing the above laboratory studies and paracentesis.  Hopefully he will obtain [facility] financial assistance to prevent further financial burden.  Dr. MRush Landmark2 further discuss scheduling an EGD and colonoscopy at the time of his follow-up appointment.   CC:  WElsie Stain MD

## 2019-09-15 NOTE — Patient Instructions (Addendum)
If you are age 65 or older, your body mass index should be between 23-30. Your Body mass index is 33.75 kg/m. If this is out of the aforementioned range listed, please consider follow up with your Primary Care Provider.  If you are age 82 or younger, your body mass index should be between 19-25. Your Body mass index is 33.75 kg/m. If this is out of the aformentioned range listed, please consider follow up with your Primary Care Provider.    Your provider has requested that you go to the basement level for lab work before leaving today. Press "B" on the elevator. The lab is located at the first door on the left as you exit the elevator.  You have been scheduled for an abdominal paracentesis at [facility] radiology (1st floor of hospital) on 09/16/2019 at 10:00am. Please arrive at least 15 minutes prior to your appointment time for registration. Should you need to reschedule this appointment for any reason, please call our office at [phone].   Jaclyn Shaggy will call you with Lasix and Spironolactone instructions after your lab results.  Follow up with Dr Rush Landmark 11/13/2019 at 10:10 am.  No alcohol! Follow up a 2 gram low sodium diet.  Due to recent changes in healthcare laws, you may see the results of your imaging and laboratory studies on MyChart before your provider has had a chance to review them.  We understand that in some cases there may be results that are confusing or concerning to you. Not all laboratory results come back in the same time frame and the provider may be waiting for multiple results in order to interpret others.  Please give [place] 48 hours in order for your provider to thoroughly review all the results before contacting the office for clarification of your results.

## 2019-09-16 ENCOUNTER — Other Ambulatory Visit: Payer: Self-pay

## 2019-09-16 ENCOUNTER — Ambulatory Visit (HOSPITAL_COMMUNITY)
Admission: RE | Admit: 2019-09-16 | Discharge: 2019-09-16 | Disposition: A | Discharge status: Home / Self Care | Payer: Medicaid Other | Source: Ambulatory Visit | Attending: Nurse Practitioner | Admitting: Nurse Practitioner

## 2019-09-16 DIAGNOSIS — R188 Other ascites: Secondary | ICD-10-CM | POA: Diagnosis not present

## 2019-09-16 DIAGNOSIS — F1010 Alcohol abuse, uncomplicated: Secondary | ICD-10-CM | POA: Diagnosis not present

## 2019-09-16 DIAGNOSIS — R772 Abnormality of alphafetoprotein: Secondary | ICD-10-CM

## 2019-09-16 DIAGNOSIS — K7460 Unspecified cirrhosis of liver: Secondary | ICD-10-CM

## 2019-09-16 HISTORY — PX: IR PARACENTESIS: IMG2679

## 2019-09-16 LAB — GRAM STAIN

## 2019-09-16 LAB — BODY FLUID CELL COUNT WITH DIFFERENTIAL
Eos, Fluid: 0 %
Lymphs, Fluid: 68 %
Monocyte-Macrophage-Serous Fluid: 32 % — ABNORMAL LOW (ref 50–90)
Neutrophil Count, Fluid: 0 % (ref 0–25)
Total Nucleated Cell Count, Fluid: 453 cu mm (ref 0–1000)

## 2019-09-16 LAB — PROTEIN, PLEURAL OR PERITONEAL FLUID: Total protein, fluid: <3 g/dL

## 2019-09-16 LAB — ALBUMIN, PLEURAL OR PERITONEAL FLUID: Albumin, Fluid: <1 g/dL

## 2019-09-16 MED ORDER — ALBUMIN HUMAN 25 % IV SOLN
INTRAVENOUS | Status: AC
  Filled 2019-09-16: qty 200

## 2019-09-16 MED ORDER — ALBUMIN HUMAN 25 % IV SOLN
50.0000 g | Freq: Once | INTRAVENOUS | Status: AC
  Administered 2019-09-16: 50 g via INTRAVENOUS

## 2019-09-16 MED ORDER — LIDOCAINE HCL 1 % IJ SOLN
INTRAMUSCULAR | Status: AC
  Filled 2019-09-16: qty 20

## 2019-09-16 MED ORDER — LIDOCAINE HCL 1 % IJ SOLN
INTRAMUSCULAR | Status: DC | PRN
  Administered 2019-09-16: 10 mL

## 2019-09-16 NOTE — Procedures (Signed)
PROCEDURE SUMMARY:  Successful image-guided paracentesis from the right lateral abdomen.  Yielded 5.0 liters of clear yellow fluid.  No immediate complications.  EBL = 0 mL. Patient tolerated well.   Specimen was sent for labs.  Please see imaging section of Epic for full dictation.   Gladney Janeli Lewison PA-C 09/16/2019 10:50 AM

## 2019-09-16 NOTE — Progress Notes (Signed)
Attending Physician's Attestation   I have reviewed the chart.   I agree with the Advanced Practitioner's note, impression, and recommendations with any updates as below.    Naol Ontiveros Mansouraty, MD [facility] Office # [phone]  

## 2019-09-17 ENCOUNTER — Other Ambulatory Visit: Payer: Self-pay | Admitting: Critical Care Medicine

## 2019-09-17 ENCOUNTER — Ambulatory Visit: Payer: Self-pay | Attending: Critical Care Medicine | Admitting: Critical Care Medicine

## 2019-09-17 ENCOUNTER — Encounter: Payer: Self-pay | Admitting: Critical Care Medicine

## 2019-09-17 VITALS — BP 148/76 | HR 83 | Temp 97.9°F | Ht 70.0 in | Wt 233.2 lb

## 2019-09-17 DIAGNOSIS — K766 Portal hypertension: Secondary | ICD-10-CM

## 2019-09-17 DIAGNOSIS — Z87898 Personal history of other specified conditions: Secondary | ICD-10-CM

## 2019-09-17 DIAGNOSIS — D696 Thrombocytopenia, unspecified: Secondary | ICD-10-CM

## 2019-09-17 DIAGNOSIS — I10 Essential (primary) hypertension: Secondary | ICD-10-CM

## 2019-09-17 DIAGNOSIS — R768 Other specified abnormal immunological findings in serum: Secondary | ICD-10-CM

## 2019-09-17 DIAGNOSIS — R601 Generalized edema: Secondary | ICD-10-CM

## 2019-09-17 DIAGNOSIS — Z23 Encounter for immunization: Secondary | ICD-10-CM

## 2019-09-17 DIAGNOSIS — B182 Chronic viral hepatitis C: Secondary | ICD-10-CM

## 2019-09-17 DIAGNOSIS — K7031 Alcoholic cirrhosis of liver with ascites: Secondary | ICD-10-CM

## 2019-09-17 DIAGNOSIS — I700 Atherosclerosis of aorta: Secondary | ICD-10-CM

## 2019-09-17 LAB — CYTOLOGY - NON PAP

## 2019-09-17 MED ORDER — SPIRONOLACTONE 25 MG PO TABS: 25.0000 mg | ORAL_TABLET | Freq: Every day | ORAL | 3 refills | Status: DC

## 2019-09-17 MED ORDER — ATORVASTATIN CALCIUM 20 MG PO TABS: 20.0000 mg | ORAL_TABLET | Freq: Every day | ORAL | 3 refills | Status: DC

## 2019-09-17 MED ORDER — FUROSEMIDE 40 MG PO TABS
40.0000 mg | ORAL_TABLET | Freq: Every day | ORAL | 1 refills | Status: DC
Start: 2019-09-17 — End: 2019-12-15

## 2019-09-17 MED FILL — SPIRONOLACTONE 25 MG TABLET: 25 | 30 days supply | Qty: 30 | Fill #0

## 2019-09-17 MED FILL — FUROSEMIDE 40 MG TAB: 40 | 30 days supply | Qty: 30 | Fill #0

## 2019-09-17 MED FILL — ATORVASTATIN CALCIUM 20 MG: 20 | 30 days supply | Qty: 30 | Fill #0

## 2019-09-17 NOTE — Assessment & Plan Note (Signed)
Due to atherosclerosis of the aorta and atorvastatin was begun will follow liver functions note recent values are improved

## 2019-09-17 NOTE — Patient Instructions (Signed)
Increase furosemide to 40mg  daily  Start spirinolactone one daily  A hepatitis C lab was drawn  Refill on atorvastatin sent  Christa See will connect with you regarding your alcohol use  Follow up with Gastroenterology  A hepatitis B vaccine was given  Return Dr Joya Gaskins 6 weeks

## 2019-09-17 NOTE — Assessment & Plan Note (Signed)
Thrombocytopenia stable at this time will observe

## 2019-09-17 NOTE — Assessment & Plan Note (Signed)
Alcohol counseling will be given

## 2019-09-17 NOTE — Assessment & Plan Note (Signed)
Cirrhotic liver disease with portal hypertension and ascites alcohol induced  Positive hepatitis C antibody awaiting RNA quantitative  Generalized anasarca and edema improved  Plan will be to begin Aldactone 25 mg daily continue furosemide and increase to 40 mg daily refill atorvastatin  We'll recheck RNA quantitative hepatitis C  We'll give hepatitis B vaccine and will attempt to acquire the hepatitis A vaccine to give the patient at a later date  Referral to alcohol counseling will be made  Financial assistance in process to be able to achieve upper endoscopy and colonoscopy

## 2019-09-17 NOTE — Progress Notes (Signed)
Subjective:    Patient ID: Richard Yang, male    DOB: Feb 11, 1955, 64 y.o.   MRN: [id number] History  64 y.o.M  Hx of ETOH cirrohsis, HTN to establish PCP care   This is a telephone visit follow-up from a May visit with primary care. The patient has history of alcohol induced cirrhotic changes based on emergency room visit in May.  Patient also has hypertension as well.  At the last visit the patient was given Aldactone and Lasix and he has a home blood pressure cuff.  He states his blood pressure now is anywhere from 120/76-130/78.  The patient states the edema in the lower extremities and abdomen are better.  He states he did have side effects on Aldactone is taking furosemide only at this time.  He is no longer been drinking alcohol since Mother's Day in May of this year.  His abdominal girth is gone down.  He does not weigh himself we do not know what his weight actually is.  The patient is thinking about getting a Covid vaccine but is yet to achieve this.  08/06/2019 Patient is seen in return follow-up and this is a face-to-face visit from the last visit which was a telephone visit.  Patient is here to establish further for primary care and follow-up for alcoholic liver cirrhosis with portal hypertension.  The patient does have moderate ascites with umbilical hernia.  Patient also has history of gallstones asymptomatic and atherosclerosis of the aorta from abdominal CT scan done in early May 2021 with an emergency room visit.  Patient initially was placed on Aldactone and furosemide but he had side effects with the Aldactone and had to stop this medication.  He is on furosemide only at 20 mg daily this is his only medication.  Note he does have increased urination but has lost 15 pounds of weight since May and has had reduction in the tense nature of the ascites of the abdomen.  Note there is documentation of prior history of thrombocytopenia and this was documented in May.  Has not yet been seen by  gastroenterology does not have any health insurance.  He has received the first of his during a Covid vaccine series and we have held off on Pneumovax and tetanus until the Covid vaccine series is completed.  His next vaccine is due next Monday. Patient does have hypertension with this and is only on the furosemide and yet with this persistent his hypertensive numbers.  Patient is also due up a hepatitis C screen and colonoscopy  Patient has note some constipation as well.  Patient denies any bleeding from his lower or upper GI tracts.  Patient states the edema in his lower extremities are improved however he still has scrotal edema  Wt Readings from Last 3 Encounters: 08/06/19 : 225 lb (102.1 kg) 06/24/19 : 240 lb (108.9 kg) 09/22/17 : 198 lb (89.8 kg)  09/17/2019 Patient is seen today in follow-up and note this patient has been seen by gastroenterology and they recommended paracentesis which occurred yesterday. He had 5 L of fluid removed. Note hepatitis C antibody is positive and his RNA quantitative did not run correctly therefore we are going to redraw this today  The patient states that his swelling is less since being on the diuretic and since having had the paracentesis. Note his hepatitis a and B studies are negative he therefore he needs a hepatitis A and B vaccine we only have B vaccine at this clinic today we'll  give this to him at this visit. The patient has normal INR liver functions have shown improvement electrolytes are stable he is on the furosemide and atorvastatin but is off the nadolol because of side effects  The patient has follow-up visits with gastroenterology who wished to perform upper endoscopy and colonoscopy also if the hepatitis C RNA quantitative is elevated he will need referral to our [facility] for hepatitis C treatment  The patient stated he did drink 1 glass of alcohol since the last visit I told him he cannot have any alcohol whatsoever and we will connect him today  with our licensed clinical social worker to connect him to alcohol treatment Just went for paracentesis recently.      Past Medical History:  Diagnosis Date  . Ascites   . Cirrhosis ([place])   . Elevated AFP   . Hepatitis C   . Inguinal hernia   . Portal hypertension ([place])   . Umbilical hernia      Family History  Problem Relation Age of Onset  . Stomach cancer Neg Hx   . Colon cancer Neg Hx   . Esophageal cancer Neg Hx   . Pancreatic cancer Neg Hx      Social History   Socioeconomic History  . Marital status: Single    Spouse name: Not on file  . Number of children: Not on file  . Years of education: Not on file  . Highest education level: Not on file  Occupational History  . Not on file  Tobacco Use  . Smoking status: Never Smoker  . Smokeless tobacco: Never Used  Vaping Use  . Vaping Use: Never used  Substance and Sexual Activity  . Alcohol use: Yes    Comment: Occ wine   . Drug use: Not Currently  . Sexual activity: Not on file  Other Topics Concern  . Not on file  Social History Narrative  . Not on file   Social Determinants of Health   Financial Resource Strain:   . Difficulty of Paying Living Expenses:   Food Insecurity:   . Worried About Charity fundraiser in the Last Year:   . Arboriculturist in the Last Year:   Transportation Needs:   . Film/video editor (Medical):   Marland Kitchen Lack of Transportation (Non-Medical):   Physical Activity:   . Days of Exercise per Week:   . Minutes of Exercise per Session:   Stress:   . Feeling of Stress :   Social Connections:   . Frequency of Communication with Friends and Family:   . Frequency of Social Gatherings with Friends and Family:   . Attends Religious Services:   . Active Member of Clubs or Organizations:   . Attends Archivist Meetings:   Marland Kitchen Marital Status:   Intimate Partner Violence:   . Fear of Current or Ex-Partner:   . Emotionally Abused:   Marland Kitchen Physically Abused:   . Sexually Abused:        No Known Allergies   Outpatient Medications Prior to Visit  Medication Sig Dispense Refill  . atorvastatin (LIPITOR) 20 MG tablet Take 1 tablet (20 mg total) by mouth daily. 90 tablet 3  . furosemide (LASIX) 20 MG tablet Take 1 tablet (20 mg total) by mouth daily. 90 tablet 3  . lactulose (CHRONULAC) 10 GM/15ML solution Take 15 mLs (10 g total) by mouth daily. (Patient not taking: Reported on 09/15/2019) 236 mL 0  . nadolol (CORGARD) 20 MG  tablet Take 1 tablet (20 mg total) by mouth daily. (Patient not taking: Reported on 09/15/2019) 30 tablet 2   No facility-administered medications prior to visit.    Review of Systems  Constitutional: Positive for activity change and fatigue.  HENT: Negative.   Respiratory: Negative.   Cardiovascular: Positive for leg swelling. Negative for chest pain and palpitations.       Edema in LE. abd ascites  Gastrointestinal: Positive for abdominal distention and constipation. Negative for anal bleeding and blood in stool.  Endocrine: Positive for polyuria.  Genitourinary: Positive for enuresis and frequency. Negative for hematuria.  Musculoskeletal: Negative.   Neurological: Negative.   Psychiatric/Behavioral: Negative.        Objective:   Physical Exam Vitals:   09/17/19 0836  BP: (!) 148/76  Pulse: 83  Temp: 97.9 F (36.6 C)  TempSrc: Temporal  SpO2: 99%  Weight: 233 lb 3.2 oz (105.8 kg)  Height: _0  (1.778 m)    Gen: Pleasant, well-nourished, in no distress,  normal affect  ENT: No lesions,  mouth clear,  oropharynx clear, no postnasal drip  Neck: No JVD, no TMG, no carotid bruits  Lungs: No use of accessory muscles, no dullness to percussion, clear without rales or rhonchi  Cardiovascular: RRR, heart sounds normal, no murmur or gallops, 2+ peripheral edema  Abdomen: Ascites present with mild umbilical hernia which reduces easily there is a positive fluid wave and this ascites appears to be improved from prior exams in May,  liver is not enlarged the spleen seems to be enlarged on palpitation, there is moderate scrotal edema but it is not tense  Since the last visit the above findings have markedly improved with 5 L paracentesis on 09/16/19  Musculoskeletal: No deformities, no cyanosis or clubbing  Neuro: alert, non focal  Skin: Warm, no lesions or rashes  Hepatic Function Latest Ref Rng & Units 09/15/2019 08/06/2019 06/18/2019  Total Protein 6.0 - 8.3 g/dL 8.4(H) 7.6 7.8  Albumin 3.5 - 5.2 g/dL 2.7(L) 2.7(L) 2.6(L)  AST 0 - 37 U/L 48(H) 71(H) 114(H)  ALT 0 - 53 U/L 25 36 60(H)  Alk Phosphatase 39 - 117 U/L 82 98 90  Total Bilirubin 0.2 - 1.2 mg/dL 2.3(H) 2.2(H) 3.3(H)  Bilirubin, Direct 0.0 - 0.3 mg/dL 0.9(H) - -   BMP Latest Ref Rng & Units 09/15/2019 08/06/2019 06/18/2019  Glucose 70 - 99 mg/dL 93 98 101(H)  BUN 6 - 23 mg/dL _1 Creatinine 0.40 - 1.50 mg/dL 1.12 1.13 1.28(H)  BUN/Creat Ratio 10 - 24 - 15 -  Sodium 135 - 145 mEq/L 135 134 137  Potassium 3.5 - 5.1 mEq/L 3.8 4.3 3.9  Chloride 96 - 112 mEq/L 101 103 103  CO2 19 - 32 mEq/L _2 Calcium 8.4 - 10.5 mg/dL 9.0 8.6 8.8(L)   CBC Latest Ref Rng & Units 09/15/2019 08/06/2019 06/18/2019  WBC 4.0 - 10.5 K/uL 5.2 5.1 5.0  Hemoglobin 13.0 - 17.0 g/dL 12.1(L) 12.5(L) 12.3(L)  Hematocrit 39 - 52 % 35.2(L) 33.2(L) 35.5(L)  Platelets 150 - 400 K/uL 91.0(L) 72(LL) 67(L)         Assessment & Plan:  I personally reviewed all images and lab data in the [facility] system as well as any outside material available during this office visit and agree with the  radiology impressions.   Liver cirrhosis, alcoholic (HCC) Cirrhotic liver disease with portal hypertension and ascites alcohol induced  Positive hepatitis C antibody awaiting RNA quantitative  Generalized anasarca and edema improved  Plan will be to begin Aldactone 25 mg daily continue furosemide and increase to 40 mg daily refill atorvastatin  We'll recheck RNA quantitative hepatitis C  We'll  give hepatitis B vaccine and will attempt to acquire the hepatitis A vaccine to give the patient at a later date  Referral to alcohol counseling will be made  Financial assistance in process to be able to achieve upper endoscopy and colonoscopy      History of alcohol use Alcohol counseling will be given  Thrombocytopenia (HCC) Thrombocytopenia stable at this time will observe  Aortic atherosclerosis ([place]) Due to atherosclerosis of the aorta and atorvastatin was begun will follow liver functions note recent values are improved   Gloria was seen today for follow-up.  Diagnoses and all orders for this visit:  Alcoholic cirrhosis of liver with ascites ([place]) -     Hepatitis B vaccine adult IM  Chronic hepatitis C without hepatic coma (HCC) -     HCV Ab w/Rflx to Verification  Portal hypertension (HCC)  Hepatitis C antibody positive in blood  Aortic atherosclerosis (HCC)  Essential hypertension  Generalized edema  History of alcohol use  Thrombocytopenia (HCC)  Other orders -     furosemide (LASIX) 40 MG tablet; Take 1 tablet (40 mg total) by mouth daily. -     atorvastatin (LIPITOR) 20 MG tablet; Take 1 tablet (20 mg total) by mouth daily. -     spironolactone (ALDACTONE) 25 MG tablet; Take 1 tablet (25 mg total) by mouth daily.

## 2019-09-18 ENCOUNTER — Other Ambulatory Visit: Payer: Self-pay

## 2019-09-18 DIAGNOSIS — K7460 Unspecified cirrhosis of liver: Secondary | ICD-10-CM

## 2019-09-21 ENCOUNTER — Encounter: Payer: Self-pay | Admitting: General Surgery

## 2019-09-21 ENCOUNTER — Telehealth: Payer: Self-pay | Admitting: General Surgery

## 2019-09-21 ENCOUNTER — Other Ambulatory Visit: Payer: Self-pay

## 2019-09-21 DIAGNOSIS — B182 Chronic viral hepatitis C: Secondary | ICD-10-CM

## 2019-09-21 DIAGNOSIS — R768 Other specified abnormal immunological findings in serum: Secondary | ICD-10-CM

## 2019-09-21 LAB — CULTURE, BODY FLUID W GRAM STAIN -BOTTLE: Culture: NO GROWTH

## 2019-09-21 NOTE — Telephone Encounter (Signed)
-----   Message from Noralyn Pick, NP sent at 09/20/2019  2:05 PM EDT ----- Olivia Mackie, Pls contact the and see if they can add LDH level to the peritoneal fluid collected at the time of his paracentesis 8/11. THX

## 2019-09-21 NOTE — Telephone Encounter (Signed)
Contacted the lab at [facility] and spoke with Acquanetta Sit. She stated they are unable to add anything on to a paracentesis after 8-24 hours. That this test could not be performed.

## 2019-09-21 NOTE — Telephone Encounter (Signed)
Noted  

## 2019-09-22 ENCOUNTER — Telehealth: Payer: Self-pay | Admitting: General Surgery

## 2019-09-22 LAB — INTERPRETATION:

## 2019-09-22 LAB — HCV AB W/RFLX TO VERIFICATION: HCV Ab: >11 s/co ratio — ABNORMAL HIGH (ref 0.0–0.9)

## 2019-09-22 LAB — HCV RNA NAA QUALITATIVE: HCV RNA NAA QUALITATIVE: POSITIVE — AB

## 2019-09-22 NOTE — Telephone Encounter (Signed)
-----   Message from Noralyn Pick, NP sent at 09/20/2019  2:05 PM EDT ----- Olivia Mackie, Pls contact the and see if they can add LDH level to the peritoneal fluid collected at the time of his paracentesis 8/11. THX

## 2019-09-23 ENCOUNTER — Telehealth: Payer: Self-pay | Admitting: Critical Care Medicine

## 2019-09-23 ENCOUNTER — Ambulatory Visit: Payer: Medicaid Other | Attending: Critical Care Medicine | Admitting: Licensed Clinical Social Worker

## 2019-09-23 ENCOUNTER — Other Ambulatory Visit: Payer: Self-pay

## 2019-09-23 DIAGNOSIS — Z87898 Personal history of other specified conditions: Secondary | ICD-10-CM

## 2019-09-23 DIAGNOSIS — K7031 Alcoholic cirrhosis of liver with ascites: Secondary | ICD-10-CM

## 2019-09-23 DIAGNOSIS — F411 Generalized anxiety disorder: Secondary | ICD-10-CM

## 2019-09-23 DIAGNOSIS — B182 Chronic viral hepatitis C: Secondary | ICD-10-CM

## 2019-09-23 NOTE — Telephone Encounter (Signed)
Let the pt know he has active hepatitis C so I am referring him to the [facility] hepatitis clinic for treatment.

## 2019-09-23 NOTE — BH Specialist Note (Signed)
Integrated Behavioral Health Visit via Telemedicine (Telephone)  09/23/2019 Richard Yang [id number]   Session Start time: 9:00 AM  Session End time: 9:10 AM Total time: 10 minutes  Referring Provider: Dr. Joya Gaskins Type of Visit: Telephonic Patient location: [facility] Provider location: Office All persons participating in visit: LCSW and Patient   Discussed confidentiality: Yes   "By engaging in this telephone visit, you consent to the provision of healthcare.  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 He requests information on online AA meetings.   Pt applied for medicaid 3 weeks ago, states that Medicare will be approx $400 monthly and is unable to afford premium.  Duration of problem: Ongoing; Severity of problem: mild  STRENGTHS (Protective Factors/Coping Skills): Pt identified strong support system Pt is motivated to remain sober and participate in services  GOALS ADDRESSED: Patient will: 1.  Reduce symptoms of: stress  2.  Increase knowledge and/or ability of: self-management skills  3.  Demonstrate ability to: Increase healthy adjustment to current life circumstances and Increase adequate support systems for patient/family  INTERVENTIONS: Interventions utilized:  Solution-Focused Strategies and Link to [employer] Standardized Assessments completed: Not Needed  ASSESSMENT: Patient currently experiencing stress triggered by financial strain. Pt is uninsured and is in need of medical coverage to assist with management of medical health conditions. He receives strong support from family and friends.   Patient may benefit from linkage to community resources to strengthen  support and maintain sobriety. Online AA resources e-mailed to patient, per his request. LCSW strongly encouraged patient to follow up with DSS regarding medicaid application. Healthy coping skills discussed.   PLAN: 1. Follow up with behavioral health clinician on : Contact LCSW with additional behavioral health and/or resource needs 2. Behavioral recommendations: Utilize strategies discussed and resources provided 3. Referral(s): [employer] Resources:  Finances and Substance use tx  Rebekah Chesterfield, LCSW 09/23/2019 5:09 PM   Confirmed patient's address: Yes  Confirmed patient's phone number: Yes  Any changes to demographics: No   Confirmed patient's insurance: Yes  Any changes to patient's insurance: No    The following statements were read to the patient and/or legal guardian that are established with the [facility] Provider.  "The purpose of this phone visit is to provide behavioral health care while limiting exposure to the coronavirus (COVID19).  There is a possibility of technology failure and discussed alternative modes of communication if that failure occurs."

## 2019-09-24 ENCOUNTER — Telehealth: Payer: Self-pay | Admitting: Pharmacy Technician

## 2019-09-24 ENCOUNTER — Encounter: Payer: Self-pay | Admitting: Family

## 2019-09-24 ENCOUNTER — Ambulatory Visit (INDEPENDENT_AMBULATORY_CARE_PROVIDER_SITE_OTHER): Payer: Self-pay | Admitting: Family

## 2019-09-24 ENCOUNTER — Other Ambulatory Visit: Payer: Self-pay

## 2019-09-24 VITALS — BP 155/88 | HR 96 | Temp 97.8°F | Wt 233.0 lb

## 2019-09-24 DIAGNOSIS — B182 Chronic viral hepatitis C: Secondary | ICD-10-CM

## 2019-09-24 DIAGNOSIS — K7031 Alcoholic cirrhosis of liver with ascites: Secondary | ICD-10-CM

## 2019-09-24 MED ORDER — SOFOSBUVIR-VELPATASVIR 400-100 MG PO TABS: 1.0000 | ORAL_TABLET | Freq: Every day | ORAL | 5 refills | Status: DC

## 2019-09-24 NOTE — Patient Instructions (Signed)
Nice to meet you.   We will get you started on a mediation called Epclusa that you will take 1 pill once daily. Your treatment duration will be planned for 6 months.   Continue to follow up with Dr. Joya Gaskins and Gastroenterology for your cirrhosis management.   Plan for follow up 1 month after starting medication to recheck your viral load.  Let us know if you have any questions.  Have a great day and stay safe!

## 2019-09-24 NOTE — Assessment & Plan Note (Signed)
Richard Yang is a 64 year old male with genotype 1 a chronic hepatitis C with initial viral load of 628,315 complicated by decompensated cirrhosis with child Pugh score C based on most recent blood work.  Risk factors for acquiring hepatitis C is primarily age.  Cirrhosis likely related to combination of previous history of alcohol as well as hepatitis C.  Currently with ascites and no other symptoms and treatment nave.  We reviewed his lab work results and discussed the plan of care including pathogenesis, risks if left untreated, and treatment options for hepatitis C.  Met with pharmacy staff today for medication assistance paperwork.  Plan of care to include Epclusa x6 months secondary to decompensated cirrhosis.  Imaging previously performed with no evidence of hepatocellular carcinoma.  Plan for follow-up in 1 month after initiation of medication.  Has follow-up with gastroenterology for cirrhosis.

## 2019-09-24 NOTE — Telephone Encounter (Addendum)
[facility] Patient Advocate Encounter  Completed and sent [employer] Path application for Epclusa for this patient who is uninsured.    Patient assistance phone number for follow up is [phone].   He is APPROVED.  [employer] will call him to set up delivery.  We will continue to follow.  Venida Jarvis. Nadara Mustard [place] Patient [facility] Phone: [phone] Fax:  [phone]

## 2019-09-24 NOTE — Telephone Encounter (Signed)
[facility] Patient Advocate Encounter ° °Insurance verification completed.   ° °The patient is uninsured and will need patient assistance for medication. ° °We can complete the application and will need to meet with the patient for signatures and income documentation. ° °Delton Stelle E. Nila Winker, CPhT °Specialty Pharmacy Patient Advocate °[facility] °Phone: [phone] °Fax:  [phone] ° ° °

## 2019-09-24 NOTE — Progress Notes (Signed)
Subjective:    Patient ID: Richard Yang, male    DOB: 08-08-1955, 64 y.o.   MRN: [id number]  Chief Complaint  Patient presents with  . New Patient (Initial Visit)    had fluid drained ~ 2 weeks ago, BLE pitting edema    HPI:  Richard Yang is a 64 y.o. male with previous medical history of alcohol use, hypertension, and newly diagnosed cirrhosis complicated with ascites presenting today for initial treatment and evaluation of hepatitis C.  Richard Yang was first diagnosed with hepatitis C on 09/25/2019 during office visit with his primary care provider and had previous positive hepatitis C antibody on 08/06/2019.  Risk factors for acquiring hepatitis C is primarily age being born between [birth year] and [birth year]. Denies history of IV drug use, tattoos, blood transfusions prior to 1992, sharing of razors/toothbrushes, or sexually contact with known positive partners. No previous family history of liver disease. Newly diagnosed with cirrhosis and currently awaiting evaluation by Gastroenterology and has undergone paracentesis. Has not received treatment to date. Current symptoms relates to cirrhosis and has ascites and without abdominal pain, jaundice, nausea, vomiting, or scleral icterus. No current recreational or illicit drug use, tobacco use or alcohol consumption.     No Known Allergies    Outpatient Medications Prior to Visit  Medication Sig Dispense Refill  . atorvastatin (LIPITOR) 20 MG tablet Take 1 tablet (20 mg total) by mouth daily. 90 tablet 3  . furosemide (LASIX) 40 MG tablet Take 1 tablet (40 mg total) by mouth daily. 60 tablet 1  . spironolactone (ALDACTONE) 25 MG tablet Take 1 tablet (25 mg total) by mouth daily. 30 tablet 3  . lactulose, encephalopathy, (CHRONULAC) 10 GM/15ML SOLN Take 10 g by mouth daily. (Patient not taking: Reported on 09/24/2019)     No facility-administered medications prior to visit.     Past Medical History:  Diagnosis Date  . Ascites   . Cirrhosis ([place])   .  Elevated AFP   . Hepatitis C   . Inguinal hernia   . Portal hypertension ([place])   . Umbilical hernia       Past Surgical History:  Procedure Laterality Date  . IR PARACENTESIS  09/16/2019  . NO PAST SURGERIES        Family History  Problem Relation Age of Onset  . Stomach cancer Neg Hx   . Colon cancer Neg Hx   . Esophageal cancer Neg Hx   . Pancreatic cancer Neg Hx       Social History   Socioeconomic History  . Marital status: Single    Spouse name: Not on file  . Number of children: Not on file  . Years of education: Not on file  . Highest education level: Not on file  Occupational History  . Not on file  Tobacco Use  . Smoking status: Never Smoker  . Smokeless tobacco: Never Used  Vaping Use  . Vaping Use: Never used  Substance and Sexual Activity  . Alcohol use: Not Currently    Comment: sober since May  . Drug use: Not Currently  . Sexual activity: Not Currently    Partners: Male  Other Topics Concern  . Not on file  Social History Narrative  . Not on file   Social Determinants of Health   Financial Resource Strain:   . Difficulty of Paying Living Expenses: Not on file  Food Insecurity:   . Worried About Charity fundraiser in the Last Year: Not on file  .  Ran Out of Food in the Last Year: Not on file  Transportation Needs:   . Lack of Transportation (Medical): Not on file  . Lack of Transportation (Non-Medical): Not on file  Physical Activity:   . Days of Exercise per Week: Not on file  . Minutes of Exercise per Session: Not on file  Stress:   . Feeling of Stress : Not on file  Social Connections:   . Frequency of Communication with Friends and Family: Not on file  . Frequency of Social Gatherings with Friends and Family: Not on file  . Attends Religious Services: Not on file  . Active Member of Clubs or Organizations: Not on file  . Attends Archivist Meetings: Not on file  . Marital Status: Not on file  Intimate Partner  Violence:   . Fear of Current or Ex-Partner: Not on file  . Emotionally Abused: Not on file  . Physically Abused: Not on file  . Sexually Abused: Not on file      Review of Systems  Constitutional: Negative for chills, diaphoresis, fatigue and fever.  Respiratory: Negative for cough, chest tightness, shortness of breath and wheezing.   Cardiovascular: Negative for chest pain.  Gastrointestinal: Negative for abdominal distention, abdominal pain, constipation, diarrhea, nausea and vomiting.  Neurological: Negative for weakness and headaches.  Hematological: Does not bruise/bleed easily.       Objective:    BP (!) 155/88   Pulse 96   Temp 97.8 F (36.6 C) (Oral)   Wt 233 lb (105.7 kg)   SpO2 100%   BMI 33.43 kg/m  Nursing note and vital signs reviewed.  Physical Exam Constitutional:      General: He is not in acute distress.    Appearance: He is well-developed.  Cardiovascular:     Rate and Rhythm: Normal rate and regular rhythm.     Heart sounds: Normal heart sounds. No murmur heard.  No friction rub. No gallop.   Pulmonary:     Effort: Pulmonary effort is normal. No respiratory distress.     Breath sounds: Normal breath sounds. No wheezing or rales.  Chest:     Chest wall: No tenderness.  Abdominal:     General: Bowel sounds are normal. There is no distension.     Palpations: Abdomen is soft. There is no mass.     Tenderness: There is no abdominal tenderness. There is no guarding or rebound.  Skin:    General: Skin is warm and dry.  Neurological:     Mental Status: He is alert and oriented to person, place, and time.  Psychiatric:        Behavior: Behavior normal.        Thought Content: Thought content normal.        Judgment: Judgment normal.        Assessment & Plan:   Patient Active Problem List   Diagnosis Date Noted  . Chronic active hepatitis C ([place]) 09/23/2019  . Hepatitis C antibody positive in blood 08/12/2019  . Portal hypertension ([place])  08/06/2019  . Thrombocytopenia ([place]) 08/06/2019  . Generalized edema 08/06/2019  . Umbilical hernia 17/79/3903  . Inguinal hernia 07/13/2019  . Aortic atherosclerosis ([place]) 07/13/2019  . History of alcohol use 07/13/2019  . Liver cirrhosis, alcoholic ([place]) 00/92/3300  . Hypertension 06/24/2019     Problem List Items Addressed This Visit      Digestive   Liver cirrhosis, alcoholic [facility])    Richard Yang is a 64 year old male  with likely alcoholic liver cirrhosis complicated with chronic hepatitis C.  Currently managed through primary care and awaiting gastroenterology evaluation.  Blood work reveals Child Pugh Class C.  Will need extended hepatitis C treatment.  Currently not drinking alcohol.  Awaiting further evaluation by gastroenterology.  Continue management with furosemide and spironolactone.  Paracentesis per primary care recommendations.      Chronic active hepatitis C ([place]) - Primary    Richard Yang is a 64 year old male with genotype 1 a chronic hepatitis C with initial viral load of 729,021 complicated by decompensated cirrhosis with child Pugh score C based on most recent blood work.  Risk factors for acquiring hepatitis C is primarily age.  Cirrhosis likely related to combination of previous history of alcohol as well as hepatitis C.  Currently with ascites and no other symptoms and treatment nave.  We reviewed his lab work results and discussed the plan of care including pathogenesis, risks if left untreated, and treatment options for hepatitis C.  Met with pharmacy staff today for medication assistance paperwork.  Plan of care to include Epclusa x6 months secondary to decompensated cirrhosis.  Imaging previously performed with no evidence of hepatocellular carcinoma.  Plan for follow-up in 1 month after initiation of medication.  Has follow-up with gastroenterology for cirrhosis.      Relevant Medications   Sofosbuvir-Velpatasvir (EPCLUSA) 400-100 MG TABS       I have  discontinued Tag Krotz's lactulose (encephalopathy). I am also having him start on Sofosbuvir-Velpatasvir. Additionally, I am having him maintain his furosemide, atorvastatin, and spironolactone.   Meds ordered this encounter  Medications  . Sofosbuvir-Velpatasvir (EPCLUSA) 400-100 MG TABS    Sig: Take 1 tablet by mouth daily. Take 1 tablet by mouth daily.    Dispense:  28 tablet    Refill:  5    Order Specific Question:   Supervising Provider    Answer:   Carlyle Basques [[id number]]     Follow-up: Return in about 1 month (around 10/25/2019).    Terri Piedra, MSN, FNP-C Nurse Practitioner [facility] [place] number: [phone]

## 2019-09-24 NOTE — Assessment & Plan Note (Signed)
Richard Yang is a 64 year old male with likely alcoholic liver cirrhosis complicated with chronic hepatitis C.  Currently managed through primary care and awaiting gastroenterology evaluation.  Blood work reveals Child Pugh Class C.  Will need extended hepatitis C treatment.  Currently not drinking alcohol.  Awaiting further evaluation by gastroenterology.  Continue management with furosemide and spironolactone.  Paracentesis per primary care recommendations.

## 2019-09-25 LAB — MITOCHONDRIAL ANTIBODIES: Mitochondrial M2 Ab, IgG: <=20 U

## 2019-09-25 LAB — ANTI-SMOOTH MUSCLE ANTIBODY, IGG: Actin (Smooth Muscle) Antibody (IGG): 37 U — ABNORMAL HIGH (ref <20)

## 2019-09-25 LAB — HEPATITIS B SURFACE ANTIBODY,QUALITATIVE: Hep B S Ab: NONREACTIVE

## 2019-09-25 LAB — TEST AUTHORIZATION

## 2019-09-25 LAB — HEPATITIS C GENOTYPE

## 2019-09-25 LAB — HEPATITIS B SURFACE ANTIGEN: Hepatitis B Surface Ag: NONREACTIVE

## 2019-09-25 LAB — IGG: IgG (Immunoglobin G), Serum: 3730 mg/dL — ABNORMAL HIGH (ref 600–1540)

## 2019-09-25 LAB — HEPATITIS C RNA QUANTITATIVE
HCV RNA, PCR, QN (Log): 5.69 log IU/mL — ABNORMAL HIGH
HCV RNA, PCR, QN: 489000 IU/mL — ABNORMAL HIGH

## 2019-09-25 LAB — ALPHA-1-ANTITRYPSIN: A-1 Antitrypsin, Ser: 155 mg/dL (ref 83–199)

## 2019-09-25 LAB — HEPATITIS B CORE ANTIBODY, TOTAL: Hep B Core Total Ab: NONREACTIVE

## 2019-09-25 LAB — CERULOPLASMIN: Ceruloplasmin: 32 mg/dL (ref 18–36)

## 2019-09-25 LAB — HEPATITIS A ANTIBODY, TOTAL: Hepatitis A AB,Total: NONREACTIVE

## 2019-09-25 LAB — ANA: Anti Nuclear Antibody (ANA): NEGATIVE

## 2019-09-25 LAB — ANTI-MICROSOMAL ANTIBODY LIVER / KIDNEY: LKM1 Ab: <=20 U (ref <=20.0)

## 2019-09-25 NOTE — Telephone Encounter (Signed)
Pt name and DOB verified. Patient aware of message below from Dr. Joya Gaskins. Patient states he has already sought treatment and f/u with [facility],

## 2019-09-30 ENCOUNTER — Telehealth: Payer: Self-pay

## 2019-09-30 NOTE — Telephone Encounter (Signed)
-----   Message from Noralyn Pick, NP sent at 09/28/2019  8:39 AM EDT ----- Regarding: RE: scheduling Olivia Mackie, pls verify if the patient has cone financial assistance at this time. If he has coverage then abdominal MRI with contrast to be ordered. He was suppose to schedule a follow up appt with Dr. Rush Landmark 2 to 3 weeks after initial consult but I see he is not scheduled until October. Please schedule him for a follow up appt with me or Dr. Rush Landmark in 1 to 2 weeks. He will need both an EGD and colonoscopy, I will not order until I know he has financial assistance. He is 64 not sure why he hasn't applied for Medicare?  Thank you for checking on all of this.  ----- Message ----- From: Letta Pate, CMA Sent: 09/24/2019   9:10 AM EDT To: Noralyn Pick, NP Subject: scheduling                                     Is it time to schedule this patients MRI and EGD?

## 2019-09-30 NOTE — Telephone Encounter (Signed)
I called and spoke to Richard Yang. He has applied for both medicare and cone assistance. He has yet to hear the outcome as this can take several weeks to process. You and Dr Rush Landmark don't have any sooner appointments. Both of the schedules are full for weeks out. The October appointment for him is pretty good unfortunately.

## 2019-10-02 NOTE — Telephone Encounter (Signed)
CKS, Thanks for keeping me up-to-date. Absolutely agree with your management at this time. Please let me know how he is doing at your next appointment although I will be away all next week. Paracentesis seems likely to be needed. Thanks. GM

## 2019-10-02 NOTE — Telephone Encounter (Signed)
Dr. Rush Landmark,  I called the patient. He was seen by ID and he was started on Epclusa for 8/23 for his hepatitis C. He is expecting his "orange card" next week then he will be able to schedule an EGD (esophageal variceal surveillance) and a screening colonoscopy. He remains on Furosemide 40mg  QD and Spironolactone 25mg  QD. He reported his weight is down a few lbs, home weight 231 lbs but he feels his abdomen is getting distended again but not tight. LE edema thighs to ankles persists. He needs an ECHO but not scheduled yet due to lack of insurance. I will order an ECHO as soon as he has financial assistance. He remains alcohol free.   Dr. Joya Gaskins provided the patient with Hep B vaccine x 1 (they didn't have Hep A). He also initiated the ID consult. As noted above, he was seen by ID and started on Epclusa at no cost to the patient.   Beth, pls call the patient and add him to my schedule at 3:45pm on Wed Sept. 1, 2021 since there are no available appointments. He will most likely need another paracentesis next week. I will order updated labs at the time of his appt if not already done by his numerous providers.   He should keep his scheduled appointment with Dr. Rush Landmark 11/2019 as scheduled.

## 2019-10-05 NOTE — Telephone Encounter (Signed)
I spoke with this very kind patient. He will arrive at 3:45 pm on 10/07/19. Confirmed address with the patient.

## 2019-10-06 NOTE — Progress Notes (Addendum)
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 He was recently diagnosed with chronic hepatitis C GT1a with decompensated cirrhosis and ascites. Cirrhosis secondary to EtOH abuse and chronic hepatitis C. No evidence of hepatic encephalopathy. Refer to his initial office consult  09/15/2019 for comprehensive history and physical.   Laboratory studies 09/15/2019: Sodium 134.  Potassium 4.3.  Chloride 103.  CO2 22.  Glucose 98.  BUN 17.  Creatinine 1.13.  Alk phos 98.  Albumin 2.7.   AST 71.  ALT 36.  Ammonia 49.  Total bili 2.2.  WBC 5.1.  Hemoglobin 12.5.  Hematocrit 33.2.  MCV 89.  Platelet 2.  INR 1.3.  AFP 26.3.  Alpha-1 antitrypsin 155.  ANA negative.  Mitochondrial antibody < 20.  Smooth muscle antibody 37.  LKM1 antibody < 20.  IgG 3730.  Ceruloplasmin 32. Hepatitis A total antibody nonreactive.  Hepatitis B surface antigen nonreactive.  Hepatitis B core total antibody nonreactive.  Hepatitis B surface antibody nonreactive.  Hep C RNA quant 489,000.  HCV genotype 1a.  He underwent a paracentesis on 09/16/2019, 5L of peritoneal fluid was removed without evidence of SBP. Cytology showed reactive mesothelial cells without evidence of malignancy.   He remains on Furosemide 9m daily. Spironolactone 266mQD. Weight down 9lbs. He is following a 2gm low sodium diet.   He received Hepatitis B vaccination at Dr. WrBettina Yang on 09/17/2019, Hepatitis A vaccine was not available at that time.  Due for 2nd Hepatitis B vaccination 10/18/2019.    He was referred to ID for chronic hepatitis C genotype 1a treatment.  His initial Hep C viral load was 489,000.  He was prescribed Epclusa 1 p.o. daily for 3 months.  First dose of Epclusa was on 09/28/2019.    An EGD for variceal surveillance and a colonoscopy previously recommended. These procedures will be schedule once his financial assistance orange card has been activated. It is not clear why he hasn't applied for Medicare.  An ECHO to rule out right sided heart failure should also be scheduled.   Elevated AFP with history of EtOH + chronic hepatis C cirrhosis. Surveillance MRI with contrast to be scheduled when financial assistance orange card activated.   He remains alcohol free since Jun 14, 2019. He does not have the desire to drink any alcohol. He denies having any N/V. No upper or lower abdominal pain. His abdomen feels more distended since he had a paracentesis done on 8/11. He is passing a yellow brownish stool daily for past few days. No blood or black stools. No CP or SOB. He sometimes coughs/ clears his throat when eating. Appetite is good. No pruritis. No confusion. No jaundice. He is following a low sodium diet. He does not add any salt to his diet. He remains on Furosemide 4075mD and Spironolactone 62m61m. He has lost 9 lbs since his initial office visit. He continues to have swelling to his hips, thighs and legs. He is urinating clear yellow to darker yellow urine at times. No NSAID use. No other complaints today.    Current Outpatient Medications on File Prior to Visit  Medication Sig Dispense Refill  . atorvastatin (LIPITOR) 20 MG tablet Take 1 tablet (20 mg total) by mouth daily. 90 tablet  3  . furosemide (LASIX) 40 MG tablet Take 1 tablet (40 mg total) by mouth daily. 60 tablet 1  . Sofosbuvir-Velpatasvir (EPCLUSA) 400-100 MG TABS Take 1 tablet by mouth daily. Take 1 tablet by mouth daily. 28 tablet 5  . spironolactone (ALDACTONE) 25 MG tablet Take 1 tablet (25 mg total) by mouth daily. 30 tablet 3   No current facility-administered medications on file prior to visit.    No Known Allergies   Current Medications, Allergies, Past Medical History, Past Surgical History,  Family History and Social History were reviewed in [employer] record.   Physical Exam: BP 132/60   Pulse 92   Ht _0  (1.778 m)   Wt 224 lb 9.6 oz (101.9 kg)   BMI 32.23 kg/m   Wt Readings from Last 3 Encounters:  10/07/19 224 lb 9.6 oz (101.9 kg)  09/24/19 233 lb (105.7 kg)  09/17/19 233 lb 3.2 oz (105.8 kg)   General: Well developed 64 year old male in no acute distress. Head: Normocephalic and atraumatic. Eyes: No scleral icterus. Conjunctiva pink . Ears: Normal auditory acuity. Mouth: Dentition intact. No ulcers or lesions.  Lungs: Clear throughout to auscultation. Heart: Regular rate and rhythm, no murmur. Abdomen: Soft, distended with ascites but his abdomen is not tense. No masses or hepatomegaly. No splenomegaly. Normal bowel sounds x 4 quadrants. Umbilical hernia present.  Rectal: Deferred.  Musculoskeletal: Symmetrical with no gross deformities. Extremities: Anasarca/pitting edema from his lower abdomen -> hips -> feet bilaterally.  Neurological: Alert oriented x 4. No focal deficits. No asterixis.  Psychological: Alert and cooperative. Normal mood and affect  Assessment and Recommendations:  38.  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  MELD 14. Child Pugh Class C. Elevated IgG  level of 3730 and elevated SMA level of 37  most likely due to alcoholic liver disease with chronic hepatitis C and less likely autoimmune hepatitis with negative ANA and SMA.  No evidence of hepatic encephalopathy. -Therapeutic paracentesis scheduled for Friday 9/3. Paracentesis labs to include cell count with diff, gram stain, aerobic and anaerobic culture, albumin and  protein level. Not to remove more than 7L. Administer Albumin 50gm IV at time of paracentesis by IR.  -CBC, CMP and PT/INR today -Continue Furosemide 25m QD and Spironolactone 251mQD for now. Await the above lab results prior to  increasing diuretics.  -2gm low sodium diet -EGD to survey for varices, screening colonoscopy, ECHO to rule out right heart failure and abdominal MRI with contrast to rule out hepatoma (elevated AFP  level) to be ordered as soon as the patient has his financial assistance orange card. Patient will call our office when orange card received.  -He will need his 2nd Hep vaccination on 10/18/2019 and 3rd vaccination on 03/19/2020 -He needs Hepatitis A vaccination - No alcohol  -Further recommendations to be determined after the above lab and paracentesis results received   2. Chronic hepatitis C GT1a. Started on Epclusa (40050m00mg) one tab Q day on 09/28/2019 per ID.  -Continue Epclusa 1 po QD, continue required follow up and labs per ID  3. Normocytic anemia.  Hg 12.1. MCV 97.6. Normal Iron level of 92. No splenomegaly per CTAP. No signs of active GI bleeding. -See plan in # 1.  4. Thrombocytopenia secondary to cirrhosis. PLT 91.

## 2019-10-07 ENCOUNTER — Encounter: Payer: Self-pay | Admitting: Nurse Practitioner

## 2019-10-07 ENCOUNTER — Other Ambulatory Visit (INDEPENDENT_AMBULATORY_CARE_PROVIDER_SITE_OTHER): Payer: Self-pay

## 2019-10-07 ENCOUNTER — Telehealth: Payer: Self-pay | Admitting: General Surgery

## 2019-10-07 ENCOUNTER — Other Ambulatory Visit: Payer: Self-pay | Admitting: General Surgery

## 2019-10-07 ENCOUNTER — Ambulatory Visit (INDEPENDENT_AMBULATORY_CARE_PROVIDER_SITE_OTHER): Payer: Self-pay | Admitting: Nurse Practitioner

## 2019-10-07 VITALS — BP 132/60 | HR 92 | Ht 70.0 in | Wt 224.6 lb

## 2019-10-07 DIAGNOSIS — K7460 Unspecified cirrhosis of liver: Secondary | ICD-10-CM

## 2019-10-07 DIAGNOSIS — K7031 Alcoholic cirrhosis of liver with ascites: Secondary | ICD-10-CM

## 2019-10-07 LAB — COMPREHENSIVE METABOLIC PANEL
ALT: 23 U/L (ref 0–53)
AST: 41 U/L — ABNORMAL HIGH (ref 0–37)
Albumin: 2.7 g/dL — ABNORMAL LOW (ref 3.5–5.2)
Alkaline Phosphatase: 69 U/L (ref 39–117)
BUN: 16 mg/dL (ref 6–23)
CO2: 26 mEq/L (ref 19–32)
Calcium: 8.6 mg/dL (ref 8.4–10.5)
Chloride: 103 mEq/L (ref 96–112)
Creatinine, Ser: 1.07 mg/dL (ref 0.40–1.50)
GFR: 83.86 mL/min (ref >60.00)
Glucose, Bld: 90 mg/dL (ref 70–99)
Potassium: 3.6 mEq/L (ref 3.5–5.1)
Sodium: 135 mEq/L (ref 135–145)
Total Bilirubin: 2.3 mg/dL — ABNORMAL HIGH (ref 0.2–1.2)
Total Protein: 7.5 g/dL (ref 6.0–8.3)

## 2019-10-07 LAB — CBC
HCT: 34.1 % — ABNORMAL LOW (ref 39.0–52.0)
Hemoglobin: 11.7 g/dL — ABNORMAL LOW (ref 13.0–17.0)
MCHC: 34.2 g/dL (ref 30.0–36.0)
MCV: 98.8 fl (ref 78.0–100.0)
Platelets: 89 10*3/uL — ABNORMAL LOW (ref 150.0–400.0)
RBC: 3.45 Mil/uL — ABNORMAL LOW (ref 4.22–5.81)
RDW: 17.2 % — ABNORMAL HIGH (ref 11.5–15.5)
WBC: 5.8 10*3/uL (ref 4.0–10.5)

## 2019-10-07 LAB — PROTIME-INR
INR: 1.3 ratio — ABNORMAL HIGH (ref 0.8–1.0)
Prothrombin Time: 14.8 s — ABNORMAL HIGH (ref 9.6–13.1)

## 2019-10-07 NOTE — Progress Notes (Signed)
error 

## 2019-10-07 NOTE — Telephone Encounter (Signed)
Spoke with Manuela Schwartz at central scheduling.Scheduled IR paracentesis at [facility] 10/09/2019 at 10am

## 2019-10-07 NOTE — Patient Instructions (Addendum)
If you are age 65 or older, your body mass index should be between 23-30. Your Body mass index is 32.23 kg/m. If this is out of the aforementioned range listed, please consider follow up with your Primary Care Provider.  If you are age 25 or younger, your body mass index should be between 19-25. Your Body mass index is 32.23 kg/m. If this is out of the aformentioned range listed, please consider follow up with your Primary Care Provider.   1.Colleen, NP will call you with your lab results from today. 2. You are scheduled at [facility] on 10/09/2019 at 10:00am. 3. Continue with your furosemide 40mg  daily 4. Constinue with your spironalactone 25 mg daily. 5. Call our office when you receive your orange card so that we may schedule an endoscopy / colonoscopy and an echocardiogram with a Cardiologist.   Due to recent changes in healthcare laws, you may see the results of your imaging and laboratory studies on MyChart before your provider has had a chance to review them.  We understand that in some cases there may be results that are confusing or concerning to you. Not all laboratory results come back in the same time frame and the provider may be waiting for multiple results in order to interpret others.  Please give [place] 48 hours in order for your provider to thoroughly review all the results before contacting the office for clarification of your results.   Thank you for choosing [facility] Noralyn Pick, CRNP

## 2019-10-07 NOTE — Telephone Encounter (Signed)
-----   Message from Noralyn Pick, NP sent at 10/07/2019  8:13 AM EDT ----- Regarding: Schedule paracentesis Olivia Mackie, please schedule a sono guided paracentesis. Albumin IV 50gm IV at time of paracentesis by IR.  Order to include max amount of fluid to be removed 7L.  Peritoneal fluid labs to include: cell count with diff, gram stain, aerobic and anaerobic cultures, protein and albumin levels.   I will order blood tests (CBC, PT/INR, CMP) at time of his office visit this afternoon. Thx

## 2019-10-09 ENCOUNTER — Ambulatory Visit (HOSPITAL_COMMUNITY)
Admission: RE | Admit: 2019-10-09 | Discharge: 2019-10-09 | Disposition: A | Discharge status: Home / Self Care | Payer: Medicaid Other | Source: Ambulatory Visit | Attending: Nurse Practitioner | Admitting: Nurse Practitioner

## 2019-10-09 ENCOUNTER — Other Ambulatory Visit: Payer: Self-pay

## 2019-10-09 DIAGNOSIS — K7031 Alcoholic cirrhosis of liver with ascites: Secondary | ICD-10-CM | POA: Diagnosis present

## 2019-10-09 HISTORY — PX: IR PARACENTESIS: IMG2679

## 2019-10-09 LAB — BODY FLUID CELL COUNT WITH DIFFERENTIAL
Lymphs, Fluid: 80 %
Monocyte-Macrophage-Serous Fluid: 19 % — ABNORMAL LOW (ref 50–90)
Neutrophil Count, Fluid: 1 % (ref 0–25)
Total Nucleated Cell Count, Fluid: 433 cu mm (ref 0–1000)

## 2019-10-09 LAB — ANTI-MICROSOMAL ANTIBODY LIVER / KIDNEY: LKM1 Ab: 10.8 Units (ref 0.0–20.0)

## 2019-10-09 LAB — ALBUMIN, PLEURAL OR PERITONEAL FLUID: Albumin, Fluid: <1 g/dL

## 2019-10-09 LAB — PROTEIN, PLEURAL OR PERITONEAL FLUID: Total protein, fluid: <3 g/dL

## 2019-10-09 LAB — GRAM STAIN

## 2019-10-09 MED ORDER — ALBUMIN HUMAN 25 % IV SOLN
50.0000 g | Freq: Once | INTRAVENOUS | Status: DC
Start: 2019-10-09 — End: 2019-10-09

## 2019-10-09 MED ORDER — ALBUMIN HUMAN 25 % IV SOLN
INTRAVENOUS | Status: AC
  Filled 2019-10-09: qty 200

## 2019-10-09 MED ORDER — ALBUMIN HUMAN 25 % IV SOLN
50.0000 g | Freq: Once | INTRAVENOUS | Status: DC
  Filled 2019-10-09: qty 200

## 2019-10-09 MED ORDER — ALBUMIN HUMAN 25 % IV SOLN: 12.5000 g | Freq: Once | INTRAVENOUS | Status: DC

## 2019-10-09 MED ORDER — LIDOCAINE HCL 1 % IJ SOLN
INTRAMUSCULAR | Status: AC
  Filled 2019-10-09: qty 20

## 2019-10-13 NOTE — Progress Notes (Signed)
Attending Physician's Attestation   I have reviewed the chart.   I agree with the Advanced Practitioner's note, impression, and recommendations with any updates as below.    Tiana Sivertson Mansouraty, MD [facility] Advanced [facility]oscopy Office # [phone]  

## 2019-10-14 LAB — CULTURE, BODY FLUID W GRAM STAIN -BOTTLE: Culture: NO GROWTH

## 2019-10-14 LAB — PATHOLOGIST SMEAR REVIEW

## 2019-10-16 MED FILL — ATORVASTATIN CALCIUM 20 MG: 20 | 30 days supply | Qty: 30 | Fill #1

## 2019-10-16 MED FILL — FUROSEMIDE 40 MG TAB: 40 | 30 days supply | Qty: 30 | Fill #1

## 2019-10-16 MED FILL — SPIRONOLACTONE 25 MG TABLET: 25 | 30 days supply | Qty: 30 | Fill #1

## 2019-10-20 ENCOUNTER — Telehealth: Payer: Self-pay | Admitting: Nurse Practitioner

## 2019-10-20 ENCOUNTER — Other Ambulatory Visit: Payer: Self-pay

## 2019-10-20 DIAGNOSIS — R188 Other ascites: Secondary | ICD-10-CM

## 2019-10-20 NOTE — Telephone Encounter (Signed)
I have spoken with Richard Yang. He will go for the ECHO on 10/27/19 at [facility] Appointment at 8:00 am. He has an appointment with Dr Rush Landmark on 11/13/19. When I know the results of the ECHO, I can schedule his procedures. Otherwise, the Pre-Visit nurses will be forced to question an [name] case with concerns on his heart function. Or, he can be scheduled during the visit with Dr Rush Landmark and get his prep.

## 2019-10-20 NOTE — Telephone Encounter (Signed)
Do you want this ordered with perflutren?

## 2019-10-20 NOTE — Telephone Encounter (Signed)
Beth, can you call the patient tomorrow to facilitate scheduling an ECHO to rule out right sided heart failure. Schedule an EGD and colonoscopy with Dr. Rush Landmark as he stated he received his [place] assistance orange card. DX: cirrhosis with ascites. EGD to assess for esophageal varices and screening colonoscopy. Thank you.

## 2019-10-20 NOTE — Telephone Encounter (Signed)
Yes with perflutren ( ECHO). thx

## 2019-10-21 ENCOUNTER — Encounter: Payer: Self-pay | Admitting: Pharmacy Technician

## 2019-10-21 NOTE — Telephone Encounter (Signed)
Richard Yang,  patient should keep his office appt with Dr. Rush Landmark as scheduled. We can schedule the EGD and colonoscopy once ECHO results received. I will check on the ECHO 9/21 result when done and I will verify EGD/colonoscopy orders at that time. Thank you.

## 2019-10-27 ENCOUNTER — Ambulatory Visit (HOSPITAL_COMMUNITY)
Admission: RE | Admit: 2019-10-27 | Discharge: 2019-10-27 | Disposition: A | Discharge status: Home / Self Care | Payer: Medicaid Other | Source: Ambulatory Visit | Attending: Nurse Practitioner | Admitting: Nurse Practitioner

## 2019-10-27 ENCOUNTER — Other Ambulatory Visit: Payer: Self-pay | Admitting: Nurse Practitioner

## 2019-10-27 DIAGNOSIS — R188 Other ascites: Secondary | ICD-10-CM | POA: Diagnosis not present

## 2019-10-27 LAB — ECHOCARDIOGRAM COMPLETE
Area-P 1/2: 3.34 cm2
S' Lateral: 2.7 cm

## 2019-10-27 NOTE — Progress Notes (Signed)
  Echocardiogram 2D Echocardiogram has been performed.  Richard Yang 10/27/2019, 8:37 AM

## 2019-10-28 ENCOUNTER — Telehealth: Payer: Self-pay | Admitting: General Surgery

## 2019-10-28 ENCOUNTER — Ambulatory Visit (INDEPENDENT_AMBULATORY_CARE_PROVIDER_SITE_OTHER): Payer: Medicaid Other | Admitting: Pharmacist

## 2019-10-28 ENCOUNTER — Other Ambulatory Visit: Payer: Self-pay

## 2019-10-28 DIAGNOSIS — K7031 Alcoholic cirrhosis of liver with ascites: Secondary | ICD-10-CM

## 2019-10-28 DIAGNOSIS — R809 Proteinuria, unspecified: Secondary | ICD-10-CM

## 2019-10-28 DIAGNOSIS — B182 Chronic viral hepatitis C: Secondary | ICD-10-CM

## 2019-10-28 NOTE — Progress Notes (Signed)
HPI: Richard Yang is a 64 y.o. male who presents to the [facility] [facility] clinic for Hepatitis C follow-up.  Medication: Epclusa x 24 weeks  Start Date: 10/05/19  Hepatitis C Genotype: 1a  Fibrosis Score: F4 - decompensated cirrhosis (ascites)  Hepatitis C RNA: 489,000 on 09/15/19  Patient Active Problem List   Diagnosis Date Noted  . Chronic active hepatitis C ([place]) 09/23/2019  . Hepatitis C antibody positive in blood 08/12/2019  . Portal hypertension ([place]) 08/06/2019  . Thrombocytopenia ([place]) 08/06/2019  . Generalized edema 08/06/2019  . Umbilical hernia 12/45/8099  . Inguinal hernia 07/13/2019  . Aortic atherosclerosis ([place]) 07/13/2019  . History of alcohol use 07/13/2019  . Liver cirrhosis, alcoholic ([place]) 83/38/2505  . Hypertension 06/24/2019    Patient's Medications  New Prescriptions   No medications on file  Previous Medications   ATORVASTATIN (LIPITOR) 20 MG TABLET    Take 1 tablet (20 mg total) by mouth daily.   FUROSEMIDE (LASIX) 40 MG TABLET    Take 1 tablet (40 mg total) by mouth daily.   SOFOSBUVIR-VELPATASVIR (EPCLUSA) 400-100 MG TABS    Take 1 tablet by mouth daily. Take 1 tablet by mouth daily.   SPIRONOLACTONE (ALDACTONE) 25 MG TABLET    Take 1 tablet (25 mg total) by mouth daily.  Modified Medications   No medications on file  Discontinued Medications   No medications on file    Allergies: No Known Allergies  Past Medical History: Past Medical History:  Diagnosis Date  . Ascites   . Cirrhosis ([place])   . Elevated AFP   . Hepatitis C   . Inguinal hernia   . Portal hypertension ([place])   . Umbilical hernia     Social History: Social History   Socioeconomic History  . Marital status: Single    Spouse name: Not on file  . Number of children: Not on file  . Years of education: Not on file  . Highest education level: Not on file  Occupational History  . Not on file  Tobacco Use  . Smoking status: Never Smoker  . Smokeless tobacco: Never Used    Vaping Use  . Vaping Use: Never used  Substance and Sexual Activity  . Alcohol use: Not Currently    Comment: sober since May  . Drug use: Not Currently  . Sexual activity: Not Currently    Partners: Male  Other Topics Concern  . Not on file  Social History Narrative  . Not on file   Social Determinants of Health   Financial Resource Strain:   . Difficulty of Paying Living Expenses: Not on file  Food Insecurity:   . Worried About Charity fundraiser in the Last Year: Not on file  . Ran Out of Food in the Last Year: Not on file  Transportation Needs:   . Lack of Transportation (Medical): Not on file  . Lack of Transportation (Non-Medical): Not on file  Physical Activity:   . Days of Exercise per Week: Not on file  . Minutes of Exercise per Session: Not on file  Stress:   . Feeling of Stress : Not on file  Social Connections:   . Frequency of Communication with Friends and Family: Not on file  . Frequency of Social Gatherings with Friends and Family: Not on file  . Attends Religious Services: Not on file  . Active Member of Clubs or Organizations: Not on file  . Attends Archivist Meetings: Not on file  .  Marital Status: Not on file    Labs: Hepatitis C Lab Results  Component Value Date   HCVGENOTYPE 1a 09/15/2019   HCVRNAPCRQN 489,000 (H) 09/15/2019   Hepatitis B Lab Results  Component Value Date   HEPBSAB NON-REACTIVE 09/15/2019   HEPBSAG NON-REACTIVE 09/15/2019   HEPBCAB NON-REACTIVE 09/15/2019   Hepatitis A Lab Results  Component Value Date   HAV NON-REACTIVE 09/15/2019   HIV Lab Results  Component Value Date   HIV NONREACTIVE 04/14/2015   Lab Results  Component Value Date   CREATININE 1.07 10/07/2019   CREATININE 1.12 09/15/2019   CREATININE 1.13 08/06/2019   CREATININE 1.28 (H) 06/18/2019   CREATININE 1.19 12/16/2015   Lab Results  Component Value Date   AST 41 (H) 10/07/2019   AST 48 (H) 09/15/2019   AST 71 (H) 08/06/2019    ALT 23 10/07/2019   ALT 25 09/15/2019   ALT 36 08/06/2019   INR 1.3 (H) 10/07/2019   INR 1.3 (H) 09/15/2019    Assessment: Richard Yang is here today to follow up for his Hepatitis C treatment. He started 24 weeks of Epclusa back on 8/30. He has decompensated cirrhosis with complications of ascites. He had 12L drained off his belly about 2 weeks ago.   He is taking his Epclusa every day as prescribed and has not missed any doses so far. He takes it every morning around 7am. He has about 5-6 tablets left in his first month and has already received his second month from [facility]. No side effects whatsoever. He feels well. Reminded him that he will need 6 months total of treatment.   Encouraged him to continue taking it every day with perfect adherence. I will schedule him a f/u with me in about 2 months at his halfway point and then have him see Marya Amsler when he has completed treatment. Answered all questions. He asked if he could start taking elderberry and I encouraged him not to as we do not know how that affects his Epclusa concentrations.  Will check labs and see him back in 2 months.   Plan: - Hep C RNA + CMET + CBC today - Continue Epclusa for 24 weeks - F/u with me again 11/29 at 915am  Emilynn Srinivasan L. Itamar Mcgowan, PharmD, BCIDP, AAHIVP, CPP Clinical Pharmacist Practitioner [facility] 10/28/2019, 2:20 PM

## 2019-10-28 NOTE — Telephone Encounter (Signed)
Spoke with Mr Richard Yang and discussed with him that his echocardiogram was normal. We will need him to come to the [facility]/lab for a UA due to + protein. The patient verbalized understanding and will come in  10/29/2019 to do the UA. Order placed

## 2019-10-28 NOTE — Telephone Encounter (Signed)
-----   Message from Noralyn Pick, NP sent at 10/28/2019  5:52 AM EDT ----- Olivia Mackie, pls contact patient and let him know his ECHO was normal. Pls provide him with a lab order for a urinalysis (he had + protein in his urine 06/2019).  THX

## 2019-10-29 ENCOUNTER — Other Ambulatory Visit (INDEPENDENT_AMBULATORY_CARE_PROVIDER_SITE_OTHER): Payer: Medicaid Other

## 2019-10-29 DIAGNOSIS — R809 Proteinuria, unspecified: Secondary | ICD-10-CM

## 2019-10-29 LAB — URINALYSIS
Bilirubin Urine: NEGATIVE
Hgb urine dipstick: NEGATIVE
Leukocytes,Ua: NEGATIVE
Nitrite: NEGATIVE
Specific Gravity, Urine: 1.025 (ref 1.000–1.030)
Total Protein, Urine: NEGATIVE
Urine Glucose: NEGATIVE
Urobilinogen, UA: 1 (ref 0.0–1.0)
pH: 6 (ref 5.0–8.0)

## 2019-11-01 LAB — CBC
HCT: 33.4 % — ABNORMAL LOW (ref 38.5–50.0)
Hemoglobin: 11.9 g/dL — ABNORMAL LOW (ref 13.2–17.1)
MCH: 32.7 pg (ref 27.0–33.0)
MCHC: 35.6 g/dL (ref 32.0–36.0)
MCV: 91.8 fL (ref 80.0–100.0)
MPV: 12.8 fL — ABNORMAL HIGH (ref 7.5–12.5)
Platelets: 76 10*3/uL — ABNORMAL LOW (ref 140–400)
RBC: 3.64 10*6/uL — ABNORMAL LOW (ref 4.20–5.80)
RDW: 14.9 % (ref 11.0–15.0)
WBC: 6 10*3/uL (ref 3.8–10.8)

## 2019-11-01 LAB — HEPATITIS C RNA QUANTITATIVE
HCV RNA, PCR, QN (Log): <1.18 log IU/mL — ABNORMAL HIGH
HCV RNA, PCR, QN: <15 IU/mL — ABNORMAL HIGH

## 2019-11-01 LAB — COMPREHENSIVE METABOLIC PANEL
AG Ratio: 0.6 (calc) — ABNORMAL LOW (ref 1.0–2.5)
ALT: 20 U/L (ref 9–46)
AST: 38 U/L — ABNORMAL HIGH (ref 10–35)
Albumin: 2.8 g/dL — ABNORMAL LOW (ref 3.6–5.1)
Alkaline phosphatase (APISO): 83 U/L (ref 35–144)
BUN: 21 mg/dL (ref 7–25)
CO2: 23 mmol/L (ref 20–32)
Calcium: 8.9 mg/dL (ref 8.6–10.3)
Chloride: 106 mmol/L (ref 98–110)
Creat: 1.05 mg/dL (ref 0.70–1.25)
Globulin: 4.5 g/dL (calc) — ABNORMAL HIGH (ref 1.9–3.7)
Glucose, Bld: 91 mg/dL (ref 65–99)
Potassium: 4.3 mmol/L (ref 3.5–5.3)
Sodium: 137 mmol/L (ref 135–146)
Total Bilirubin: 1.9 mg/dL — ABNORMAL HIGH (ref 0.2–1.2)
Total Protein: 7.3 g/dL (ref 6.1–8.1)

## 2019-11-04 ENCOUNTER — Ambulatory Visit: Payer: Medicaid Other | Attending: Critical Care Medicine | Admitting: Critical Care Medicine

## 2019-11-04 ENCOUNTER — Ambulatory Visit (HOSPITAL_BASED_OUTPATIENT_CLINIC_OR_DEPARTMENT_OTHER): Payer: Medicaid Other | Admitting: Pharmacist

## 2019-11-04 ENCOUNTER — Other Ambulatory Visit: Payer: Self-pay

## 2019-11-04 ENCOUNTER — Encounter: Payer: Self-pay | Admitting: Critical Care Medicine

## 2019-11-04 VITALS — BP 152/80 | HR 71 | Temp 97.8°F | Resp 16 | Ht 70.0 in | Wt 204.6 lb

## 2019-11-04 DIAGNOSIS — B182 Chronic viral hepatitis C: Secondary | ICD-10-CM | POA: Diagnosis not present

## 2019-11-04 DIAGNOSIS — Z23 Encounter for immunization: Secondary | ICD-10-CM

## 2019-11-04 DIAGNOSIS — K7031 Alcoholic cirrhosis of liver with ascites: Secondary | ICD-10-CM | POA: Diagnosis not present

## 2019-11-04 DIAGNOSIS — K766 Portal hypertension: Secondary | ICD-10-CM

## 2019-11-04 DIAGNOSIS — Z1211 Encounter for screening for malignant neoplasm of colon: Secondary | ICD-10-CM

## 2019-11-04 DIAGNOSIS — I700 Atherosclerosis of aorta: Secondary | ICD-10-CM | POA: Diagnosis not present

## 2019-11-04 DIAGNOSIS — I10 Essential (primary) hypertension: Secondary | ICD-10-CM

## 2019-11-04 NOTE — Progress Notes (Signed)
Patient presents for vaccination against hepA/B per orders of Dr. Joya Gaskins. Consent given. Counseling provided. No contraindications exists. Vaccine administered without incident.   Benard Halsted, PharmD, [place] [phone]

## 2019-11-04 NOTE — Assessment & Plan Note (Signed)
Chronic active hepatitis continue antiviral per hep C clinic

## 2019-11-04 NOTE — Assessment & Plan Note (Signed)
Mild elevation in systolic blood pressure will monitor and continue diuretic program

## 2019-11-04 NOTE — Assessment & Plan Note (Signed)
Aortic atherosclerosis for this the patient is now on atorvastatin and will monitor

## 2019-11-04 NOTE — Patient Instructions (Addendum)
Hepatitis B second vaccine given return in 1 month to see Lurena Joiner to receive your third vaccination  You received your hepatitis a vaccination today  You do need a tetanus, flu, Pneumovax but we will do these in the next 2 weeks with luke No change in medications  Return to Dr. Joya Gaskins 2 months  Referral for colonoscopy is sent to your primary gastroenterologist  Hepatitis A Vaccine, Inactivated suspension for injection What is this medicine? HEPATITIS A VACCINE (hep uh TAHY tis A VAK seen) is a vaccine to protect from an infection with the hepatitis A virus. This vaccine does not contain the live virus. It will not cause a hepatitis infection. This vaccine is also used with immunoglobulin to prevent infection in people who have been exposed to hepatitis A. This medicine may be used for other purposes; ask your health care provider or pharmacist if you have questions. COMMON BRAND NAME(S): Havrix, Vaqta What should I tell my health care provider before I take this medicine? They need to know if you have any of these conditions:  bleeding disorder  fever or infection  heart disease  immune system problems  an unusual or allergic reaction to hepatitis A vaccine, latex, neomycin, other medicines, foods, dyes, or preservatives  pregnant or trying to get pregnant  breast-feeding How should I use this medicine? This vaccine is for injection into a muscle. It is given by a health care professional. A copy of Vaccine Information Statements will be given before each vaccination. Read this sheet carefully each time. The sheet may change frequently. Talk to your pediatrician regarding the use of this medicine in children. While this drug may be prescribed for children as young as 57 months of age for selected conditions, precautions do apply. Overdosage: If you think you have taken too much of this medicine contact a poison control center or emergency room at once. NOTE: This medicine is only  for you. Do not share this medicine with others. What if I miss a dose? This does not apply. What may interact with this medicine?  medicines to treat cancer  medicines that suppress your immune function like adalimumab, anakinra, etanercept, infliximab  steroid medicines like prednisone or cortisone This list may not describe all possible interactions. Give your health care provider a list of all the medicines, herbs, non-prescription drugs, or dietary supplements you use. Also tell them if you smoke, drink alcohol, or use illegal drugs. Some items may interact with your medicine. What should I watch for while using this medicine? See your health care provider for a booster shot of this vaccine as directed. Tell your doctor right away if you have any serious or unusual side effects after getting this vaccine. You will not have protection from the hepatitis A virus for at least 8 to 10 days after your first injection. The length of time you will have protection from hepatitis A virus infection is not known. Check with your doctor if you have questions about your immunity. See your doctor before you travel out of the country. What side effects may I notice from receiving this medicine? Side effects that you should report to your doctor or health care professional as soon as possible:  allergic reactions like skin rash, itching or hives, swelling of the face, lips, or tongue  breathing problems  seizures  yellowing of the eyes or skin Side effects that usually do not require medical attention (report to your doctor or health care professional if they continue or  are bothersome):  diarrhea  fever  loss of appetite  muscle pain  nausea  pain, redness, swelling or irritation at site where injected  tiredness This list may not describe all possible side effects. Call your doctor for medical advice about side effects. You may report side effects to FDA at 1-800-FDA-1088. Where should  I keep my medicine? This drug is given in a hospital or clinic and will not be stored at home. NOTE: This sheet is a summary. It may not cover all possible information. If you have questions about this medicine, talk to your doctor, pharmacist, or health care provider.  2020 Elsevier/Gold Standard (2013-05-25 13:19:40) Hepatitis B Vaccine, Recombinant injection What is this medicine? HEPATITIS B VACCINE (hep uh TAHY tis B VAK seen) is a vaccine. It is used to prevent an infection with the hepatitis B virus. This medicine may be used for other purposes; ask your health care provider or pharmacist if you have questions. COMMON BRAND NAME(S): Engerix-B, Recombivax HB What should I tell my health care provider before I take this medicine? They need to know if you have any of these conditions:  fever, infection  heart disease  hepatitis B infection  immune system problems  kidney disease  an unusual or allergic reaction to vaccines, yeast, other medicines, foods, dyes, or preservatives  pregnant or trying to get pregnant  breast-feeding How should I use this medicine? This vaccine is for injection into a muscle. It is given by a health care professional. A copy of Vaccine Information Statements will be given before each vaccination. Read this sheet carefully each time. The sheet may change frequently. Talk to your pediatrician regarding the use of this medicine in children. While this drug may be prescribed for children as young as newborn for selected conditions, precautions do apply. Overdosage: If you think you have taken too much of this medicine contact a poison control center or emergency room at once. NOTE: This medicine is only for you. Do not share this medicine with others. What if I miss a dose? It is important not to miss your dose. Call your doctor or health care professional if you are unable to keep an appointment. What may interact with this medicine?  medicines that  suppress your immune function like adalimumab, anakinra, infliximab  medicines to treat cancer  steroid medicines like prednisone or cortisone This list may not describe all possible interactions. Give your health care provider a list of all the medicines, herbs, non-prescription drugs, or dietary supplements you use. Also tell them if you smoke, drink alcohol, or use illegal drugs. Some items may interact with your medicine. What should I watch for while using this medicine? See your health care provider for all shots of this vaccine as directed. You must have 3 shots of this vaccine for protection from hepatitis B infection. Tell your doctor right away if you have any serious or unusual side effects after getting this vaccine. What side effects may I notice from receiving this medicine? Side effects that you should report to your doctor or health care professional as soon as possible:  allergic reactions like skin rash, itching or hives, swelling of the face, lips, or tongue  breathing problems  confused, irritated  fast, irregular heartbeat  flu-like syndrome  numb, tingling pain  seizures  unusually weak or tired Side effects that usually do not require medical attention (report to your doctor or health care professional if they continue or are bothersome):  diarrhea  fever  headache  loss of appetite  muscle pain  nausea  pain, redness, swelling, or irritation at site where injected  tiredness This list may not describe all possible side effects. Call your doctor for medical advice about side effects. You may report side effects to FDA at 1-800-FDA-1088. Where should I keep my medicine? This drug is given in a hospital or clinic and will not be stored at home. NOTE: This sheet is a summary. It may not cover all possible information. If you have questions about this medicine, talk to your doctor, pharmacist, or health care provider.  2020 Elsevier/Gold Standard  (2013-05-25 13:26:01)  Hepatitis A; Hepatitis B Vaccine injection What is this medicine? HEPATITIS A VACCINE; HEPATITIS B VACCINE (hep uh TAHY tis A vak SEEN; hep uh TAHY tis B vak SEEN) is a vaccine to protect from an infection with the hepatitis A and B virus. This vaccine does not contain the live viruses. It will not cause a hepatitis infection. This medicine may be used for other purposes; ask your health care provider or pharmacist if you have questions. COMMON BRAND NAME(S): Twinrix What should I tell my health care provider before I take this medicine? They need to know if you have any of these conditions:  bleeding disorder  fever or infection  heart disease  immune system problems  an unusual or allergic reaction to hepatitis A or B vaccine, neomycin, yeast, thimerosal, other medicines, foods, dyes, or preservatives  pregnant or trying to get pregnant  breast-feeding How should I use this medicine? This vaccine is for injection into a muscle. It is given by a health care professional. A copy of Vaccine Information Statements will be given before each vaccination. Read this sheet carefully each time. The sheet may change frequently. Talk to your pediatrician regarding the use of this medicine in children. Special care may be needed. Overdosage: If you think you have taken too much of this medicine contact a poison control center or emergency room at once. NOTE: This medicine is only for you. Do not share this medicine with others. What if I miss a dose? It is important not to miss your dose. Call your doctor or health care professional if you are unable to keep an appointment. What may interact with this medicine?  medicines that suppress your immune function like adalimumab, anakinra, infliximab  medicines to treat cancer  steroid medicines like prednisone or cortisone This list may not describe all possible interactions. Give your health care provider a list of all  the medicines, herbs, non-prescription drugs, or dietary supplements you use. Also tell them if you smoke, drink alcohol, or use illegal drugs. Some items may interact with your medicine. What should I watch for while using this medicine? See your health care provider for all shots of this vaccine as directed. You must have 3 to 4 shots of this vaccine for protection from hepatitis A and B infection. Tell your doctor right away if you have any serious or unusual side effects after getting this vaccine. What side effects may I notice from receiving this medicine? Side effects that you should report to your doctor or health care professional as soon as possible:  allergic reactions like skin rash, itching or hives, swelling of the face, lips, or tongue  breathing problems  confused, irritated  fast, irregular heartbeat  flu-like syndrome  numb, tingling pain  seizures Side effects that usually do not require medical attention (report to your doctor or health care professional if  they continue or are bothersome):  diarrhea  fever  headache  loss of appetite  muscle pain  nausea  pain, redness, swelling, or irritation at site where injected  tiredness This list may not describe all possible side effects. Call your doctor for medical advice about side effects. You may report side effects to FDA at 1-800-FDA-1088. Where should I keep my medicine? This drug is given in a hospital or clinic and will not be stored at home. NOTE: This sheet is a summary. It may not cover all possible information. If you have questions about this medicine, talk to your doctor, pharmacist, or health care provider.  2020 Elsevier/Gold Standard (2007-06-06 15:21:37)

## 2019-11-04 NOTE — Progress Notes (Signed)
Subjective:    Patient ID: Richard Yang, male    DOB: [birth year], 64 y.o.   MRN: [id number] History  64 y.o.M  Hx of ETOH cirrohsis, HTN to establish PCP care   07/13/19 This is a telephone visit follow-up from a May visit with primary care. The patient has history of alcohol induced cirrhotic changes based on emergency room visit in May.  Patient also has hypertension as well.  At the last visit the patient was given Aldactone and Lasix and he has a home blood pressure cuff.  He states his blood pressure now is anywhere from 120/76-130/78.  The patient states the edema in the lower extremities and abdomen are better.  He states he did have side effects on Aldactone is taking furosemide only at this time.  He is no longer been drinking alcohol since Mother's Day in May of this year.  His abdominal girth is gone down.  He does not weigh himself we do not know what his weight actually is.  The patient is thinking about getting a Covid vaccine but is yet to achieve this.  08/06/2019 Patient is seen in return follow-up and this is a face-to-face visit from the last visit which was a telephone visit.  Patient is here to establish further for primary care and follow-up for alcoholic liver cirrhosis with portal hypertension.  The patient does have moderate ascites with umbilical hernia.  Patient also has history of gallstones asymptomatic and atherosclerosis of the aorta from abdominal CT scan done in early May 2021 with an emergency room visit.  Patient initially was placed on Aldactone and furosemide but he had side effects with the Aldactone and had to stop this medication.  He is on furosemide only at 20 mg daily this is his only medication.  Note he does have increased urination but has lost 15 pounds of weight since May and has had reduction in the tense nature of the ascites of the abdomen.  Note there is documentation of prior history of thrombocytopenia and this was documented in May.  Has not yet been  seen by gastroenterology does not have any health insurance.  He has received the first of his during a Covid vaccine series and we have held off on Pneumovax and tetanus until the Covid vaccine series is completed.  His next vaccine is due next Monday. Patient does have hypertension with this and is only on the furosemide and yet with this persistent his hypertensive numbers.  Patient is also due up a hepatitis C screen and colonoscopy  Patient has note some constipation as well.  Patient denies any bleeding from his lower or upper GI tracts.  Patient states the edema in his lower extremities are improved however he still has scrotal edema  Wt Readings from Last 3 Encounters: 08/06/19 : 225 lb (102.1 kg) 06/24/19 : 240 lb (108.9 kg) 09/22/17 : 198 lb (89.8 kg)  09/17/2019 Patient is seen today in follow-up and note this patient has been seen by gastroenterology and they recommended paracentesis which occurred yesterday. He had 5 L of fluid removed. Note hepatitis C antibody is positive and his RNA quantitative did not run correctly therefore we are going to redraw this today  The patient states that his swelling is less since being on the diuretic and since having had the paracentesis. Note his hepatitis a and B studies are negative he therefore he needs a hepatitis A and B vaccine we only have B vaccine at this clinic today  we'll give this to him at this visit. The patient has normal INR liver functions have shown improvement electrolytes are stable he is on the furosemide and atorvastatin but is off the nadolol because of side effects  The patient has follow-up visits with gastroenterology who wished to perform upper endoscopy and colonoscopy also if the hepatitis C RNA quantitative is elevated he will need referral to our [facility] for hepatitis C treatment  The patient stated he did drink 1 glass of alcohol since the last visit I told him he cannot have any alcohol whatsoever and we will connect him  today with our licensed clinical social worker to connect him to alcohol treatment Just went for paracentesis recently.      11/04/2019 Since the last visit the patient is doing well.  He has started an antiviral for hepatitis C.  The patient has had his counseling performed as well for alcohol use and is no longer obtaining alcohol.  He now has Medicaid.  The patient has an upper endoscopy scheduled he did also needs a colonoscopy for cancer screening.  The patient is receiving his second hepatitis B vaccine today and needs his hepatitis A vaccine today.  Patient is also due a Tdap flu shot and Pneumovax 23 valent however these cannot all be given today with his other vaccines note he is already fully vaccinated for Covid and does not need his booster until January based on his last date of vaccination occurred in July  There are no other new complaints.  He had questions about whether he could use elderberry and emergency supplement and I told him he can stay on those vitamins    Past Medical History:  Diagnosis Date  . Ascites   . Cirrhosis ([place])   . Elevated AFP   . Hepatitis C   . Inguinal hernia   . Portal hypertension ([place])   . Umbilical hernia      Family History  Problem Relation Age of Onset  . Stomach cancer Neg Hx   . Colon cancer Neg Hx   . Esophageal cancer Neg Hx   . Pancreatic cancer Neg Hx      Social History   Socioeconomic History  . Marital status: Single    Spouse name: Not on file  . Number of children: Not on file  . Years of education: Not on file  . Highest education level: Not on file  Occupational History  . Not on file  Tobacco Use  . Smoking status: Never Smoker  . Smokeless tobacco: Never Used  Vaping Use  . Vaping Use: Never used  Substance and Sexual Activity  . Alcohol use: Not Currently    Comment: sober since May  . Drug use: Not Currently  . Sexual activity: Not Currently    Partners: Male  Other Topics Concern  . Not on file    Social History Narrative  . Not on file   Social Determinants of Health   Financial Resource Strain:   . Difficulty of Paying Living Expenses: Not on file  Food Insecurity:   . Worried About Charity fundraiser in the Last Year: Not on file  . Ran Out of Food in the Last Year: Not on file  Transportation Needs:   . Lack of Transportation (Medical): Not on file  . Lack of Transportation (Non-Medical): Not on file  Physical Activity:   . Days of Exercise per Week: Not on file  . Minutes of Exercise per Session: Not  on file  Stress:   . Feeling of Stress : Not on file  Social Connections:   . Frequency of Communication with Friends and Family: Not on file  . Frequency of Social Gatherings with Friends and Family: Not on file  . Attends Religious Services: Not on file  . Active Member of Clubs or Organizations: Not on file  . Attends Archivist Meetings: Not on file  . Marital Status: Not on file  Intimate Partner Violence:   . Fear of Current or Ex-Partner: Not on file  . Emotionally Abused: Not on file  . Physically Abused: Not on file  . Sexually Abused: Not on file     No Known Allergies   Outpatient Medications Prior to Visit  Medication Sig Dispense Refill  . atorvastatin (LIPITOR) 20 MG tablet Take 1 tablet (20 mg total) by mouth daily. 90 tablet 3  . furosemide (LASIX) 40 MG tablet Take 1 tablet (40 mg total) by mouth daily. 60 tablet 1  . Sofosbuvir-Velpatasvir (EPCLUSA) 400-100 MG TABS Take 1 tablet by mouth daily. Take 1 tablet by mouth daily. 28 tablet 5  . spironolactone (ALDACTONE) 25 MG tablet Take 1 tablet (25 mg total) by mouth daily. 30 tablet 3   No facility-administered medications prior to visit.    Review of Systems  Constitutional: Positive for activity change and fatigue.  HENT: Negative.   Respiratory: Negative.   Cardiovascular: Positive for leg swelling. Negative for chest pain and palpitations.       Edema in LE. abd ascites   Gastrointestinal: Positive for abdominal distention and constipation. Negative for anal bleeding and blood in stool.  Endocrine: Positive for polyuria.  Genitourinary: Positive for enuresis and frequency. Negative for hematuria.  Musculoskeletal: Negative.   Neurological: Negative.   Psychiatric/Behavioral: Negative.        Objective:   Physical Exam Vitals:   11/04/19 0849  BP: (!) 152/80  Pulse: 71  Resp: 16  Temp: 97.8 F (36.6 C)  SpO2: 98%  Weight: 204 lb 9.6 oz (92.8 kg)  Height: _0  (1.778 m)    Gen: Pleasant, well-nourished, in no distress,  normal affect  ENT: No lesions,  mouth clear,  oropharynx clear, no postnasal drip  Neck: No JVD, no TMG, no carotid bruits  Lungs: No use of accessory muscles, no dullness to percussion, clear without rales or rhonchi  Cardiovascular: RRR, heart sounds normal, no murmur or gallops, 2+ peripheral edema  Abdomen: Ascites present with mild umbilical hernia which reduces easily there is a positive fluid wave and this ascites appears to be improved from prior exams in May, liver is not enlarged the spleen seems to be enlarged on palpitation, there is moderate scrotal edema but it is not tense  Since the last visit the above findings have markedly improved with 5 L paracentesis on 09/16/19  Musculoskeletal: No deformities, no cyanosis or clubbing  Neuro: alert, non focal  Skin: Warm, no lesions or rashes  Hepatic Function Latest Ref Rng & Units 10/28/2019 10/07/2019 09/15/2019  Total Protein 6.1 - 8.1 g/dL 7.3 7.5 8.4(H)  Albumin 3.5 - 5.2 g/dL - 2.7(L) 2.7(L)  AST 10 - 35 U/L 38(H) 41(H) 48(H)  ALT 9 - 46 U/L _1 Alk Phosphatase 39 - 117 U/L - 69 82  Total Bilirubin 0.2 - 1.2 mg/dL 1.9(H) 2.3(H) 2.3(H)  Bilirubin, Direct 0.0 - 0.3 mg/dL - - 0.9(H)   BMP Latest Ref Rng & Units 10/28/2019 10/07/2019 09/15/2019  Glucose 65 - 99 mg/dL 91 90 93  BUN 7 - 25 mg/dL _0 Creatinine 0.70 - 1.25 mg/dL 1.05 1.07 1.12   BUN/Creat Ratio 6 - 22 (calc) NOT APPLICABLE - -  Sodium 940 - 146 mmol/L 137 135 135  Potassium 3.5 - 5.3 mmol/L 4.3 3.6 3.8  Chloride 98 - 110 mmol/L 106 103 101  CO2 20 - 32 mmol/L _1 Calcium 8.6 - 10.3 mg/dL 8.9 8.6 9.0   CBC Latest Ref Rng & Units 10/28/2019 10/07/2019 09/15/2019  WBC 3.8 - 10.8 Thousand/uL 6.0 5.8 5.2  Hemoglobin 13.2 - 17.1 g/dL 11.9(L) 11.7(L) 12.1(L)  Hematocrit 38 - 50 % 33.4(L) 34.1(L) 35.2(L)  Platelets 140 - 400 Thousand/uL 76(L) 89.0(L) 91.0(L)         Assessment & Plan:  I personally reviewed all images and lab data in the [facility] system as well as any outside material available during this office visit and agree with the  radiology impressions.   Aortic atherosclerosis (HCC) Aortic atherosclerosis for this the patient is now on atorvastatin and will monitor  Hypertension Mild elevation in systolic blood pressure will monitor and continue diuretic program  Chronic active hepatitis C ([place]) Chronic active hepatitis continue antiviral per hep C clinic   Amarii was seen today for follow-up.  Diagnoses and all orders for this visit:  Chronic active hepatitis C ([place])  Portal hypertension ([place])  Aortic atherosclerosis ([place])  Alcoholic cirrhosis of liver with ascites ([place])  Essential hypertension  Colon cancer screening -     Ambulatory referral to Gastroenterology   Referral to gastroenterology for colonoscopy was made hepatitis B second vaccine was given today hepatitis A vaccine given patient will return in 6 weeks for flu vaccine Pneumovax 23 valent and Tdap  Patient will return in 1 month for his final hepatitis B vaccine

## 2019-11-12 MED FILL — ATORVASTATIN CALCIUM 20 MG: 20 | 30 days supply | Qty: 30 | Fill #2

## 2019-11-12 MED FILL — SPIRONOLACTONE 25 MG TABLET: 25 | 30 days supply | Qty: 30 | Fill #2

## 2019-11-12 MED FILL — FUROSEMIDE 40 MG TAB: 40 | 30 days supply | Qty: 30 | Fill #2

## 2019-11-13 ENCOUNTER — Ambulatory Visit (INDEPENDENT_AMBULATORY_CARE_PROVIDER_SITE_OTHER): Payer: Self-pay | Admitting: Gastroenterology

## 2019-11-13 ENCOUNTER — Other Ambulatory Visit: Payer: Self-pay | Admitting: Gastroenterology

## 2019-11-13 ENCOUNTER — Encounter: Payer: Self-pay | Admitting: Gastroenterology

## 2019-11-13 VITALS — BP 130/66 | HR 88 | Ht 69.5 in | Wt 209.1 lb

## 2019-11-13 DIAGNOSIS — B182 Chronic viral hepatitis C: Secondary | ICD-10-CM

## 2019-11-13 DIAGNOSIS — R188 Other ascites: Secondary | ICD-10-CM

## 2019-11-13 DIAGNOSIS — K7469 Other cirrhosis of liver: Secondary | ICD-10-CM

## 2019-11-13 DIAGNOSIS — F1021 Alcohol dependence, in remission: Secondary | ICD-10-CM

## 2019-11-13 DIAGNOSIS — R601 Generalized edema: Secondary | ICD-10-CM

## 2019-11-13 DIAGNOSIS — K4090 Unilateral inguinal hernia, without obstruction or gangrene, not specified as recurrent: Secondary | ICD-10-CM

## 2019-11-13 DIAGNOSIS — R772 Abnormality of alphafetoprotein: Secondary | ICD-10-CM

## 2019-11-13 DIAGNOSIS — B1920 Unspecified viral hepatitis C without hepatic coma: Secondary | ICD-10-CM

## 2019-11-13 DIAGNOSIS — K4021 Bilateral inguinal hernia, without obstruction or gangrene, recurrent: Secondary | ICD-10-CM

## 2019-11-13 DIAGNOSIS — R609 Edema, unspecified: Secondary | ICD-10-CM

## 2019-11-13 DIAGNOSIS — K766 Portal hypertension: Secondary | ICD-10-CM

## 2019-11-13 MED ORDER — SPIRONOLACTONE 50 MG PO TABS: 50.0000 mg | ORAL_TABLET | Freq: Every day | ORAL | 2 refills | Status: DC

## 2019-11-13 NOTE — Progress Notes (Addendum)
[place] VISIT   Primary Care Provider Elsie Stain, MD [address] [place] [address] [phone]   Patient Profile: Richard Yang is a 64 y.o. male with a pmh significant for cirrhosis  (complicated by portal hypertension manifested as ascites and anasarca and thrombocytopenia), chronic hepatitis C (currently on treatment), cholelithiasis, hyperlipidemia, previous chronic alcohol use, inguinal hernia.  The patient presents to the [facility] for an evaluation and management of problem(s) noted below:  Problem List 1. Other cirrhosis of liver ([place])   2. Portal hypertension ([place])   3. Chronic hepatitis C without hepatic coma ([place])   4. Ascites of liver   5. Anasarca   6. Bilateral recurrent inguinal hernia without obstruction or gangrene   7. Elevated AFP   8. Alcohol use disorder, moderate, in early remission, dependence ([place])     History of Present Illness Please see initial consultation note and progress notes by NP [facility] for full details of HPI.  Interval History The patient returns for scheduled follow-up.  Fortunately, his insurance is not accepted by our practice and if he is going to continue to see [place] now that he has insurance he will need to work on having his insurance changed otherwise he will need to see a different GI practice.  Patient has not scheduled his upper and lower endoscopy as he had not had insurance.  Overall, the patient feels that he continues to do better than he had since his diagnosis.  It has been over a month since his last LVP but he feels his weight is stable but he is beginning to have some more discomfort.  He is also having more discomfort in his scrotum in the setting of his areas of inguinal hernia/herniation.  Patient denies progressive jaundice symptoms.  He is eating well.  He is not drinking alcohol.  He is tolerating his hepatitis C treatment.  The patient denies any issues  with jaundice, scleral icterus, pruritus, darkened/amber urine, clay-colored stools, LE edema, hematemesis, coffee-ground emesis, confusion.  The patient is not taking nonsteroidals or BC/Goody powders.  GI Review of Systems Positive as above Negative for pyrosis, dysphagia, nausea, vomiting, change in bowel habits, melena, hematochezia  Review of Systems General: Denies fevers/chills/weight loss unintentionally HEENT: Denies oral lesions Cardiovascular: Denies chest pain Pulmonary: Denies shortness of breath Gastroenterological: See HPI Genitourinary: Denies darkened urine Hematological: Positive for history of easy bruising/bleeding Dermatological: Denies jaundice Psychological: Mood is stable   Medications Current Outpatient Medications  Medication Sig Dispense Refill  . atorvastatin (LIPITOR) 20 MG tablet Take 1 tablet (20 mg total) by mouth daily. 90 tablet 3  . furosemide (LASIX) 40 MG tablet Take 1 tablet (40 mg total) by mouth daily. 60 tablet 1  . Sofosbuvir-Velpatasvir (EPCLUSA) 400-100 MG TABS Take 1 tablet by mouth daily. Take 1 tablet by mouth daily. 28 tablet 5  . spironolactone (ALDACTONE) 50 MG tablet Take 1 tablet (50 mg total) by mouth daily. 30 tablet 2   No current facility-administered medications for this visit.    Allergies No Known Allergies  Histories Past Medical History:  Diagnosis Date  . Ascites   . Cirrhosis ([place])   . Elevated AFP   . Hepatitis C   . Inguinal hernia   . Portal hypertension ([place])   . Umbilical hernia    Past Surgical History:  Procedure Laterality Date  . IR PARACENTESIS  09/16/2019  . IR PARACENTESIS  10/09/2019  . NO PAST SURGERIES  Social History   Socioeconomic History  . Marital status: Single    Spouse name: Not on file  . Number of children: Not on file  . Years of education: Not on file  . Highest education level: Not on file  Occupational History  . Not on file  Tobacco Use  . Smoking status: Never  Smoker  . Smokeless tobacco: Never Used  Vaping Use  . Vaping Use: Never used  Substance and Sexual Activity  . Alcohol use: Not Currently    Comment: sober since May  . Drug use: Not Currently  . Sexual activity: Not Currently    Partners: Male  Other Topics Concern  . Not on file  Social History Narrative  . Not on file   Social Determinants of Health   Financial Resource Strain:   . Difficulty of Paying Living Expenses: Not on file  Food Insecurity:   . Worried About Charity fundraiser in the Last Year: Not on file  . Ran Out of Food in the Last Year: Not on file  Transportation Needs:   . Lack of Transportation (Medical): Not on file  . Lack of Transportation (Non-Medical): Not on file  Physical Activity:   . Days of Exercise per Week: Not on file  . Minutes of Exercise per Session: Not on file  Stress:   . Feeling of Stress : Not on file  Social Connections:   . Frequency of Communication with Friends and Family: Not on file  . Frequency of Social Gatherings with Friends and Family: Not on file  . Attends Religious Services: Not on file  . Active Member of Clubs or Organizations: Not on file  . Attends Archivist Meetings: Not on file  . Marital Status: Not on file  Intimate Partner Violence:   . Fear of Current or Ex-Partner: Not on file  . Emotionally Abused: Not on file  . Physically Abused: Not on file  . Sexually Abused: Not on file   Family History  Problem Relation Age of Onset  . Stomach cancer Neg Hx   . Colon cancer Neg Hx   . Esophageal cancer Neg Hx   . Pancreatic cancer Neg Hx    I have reviewed his medical, social, and family history in detail and updated the electronic medical record as necessary.    PHYSICAL EXAMINATION  BP 130/66 (BP Location: Left Arm, Patient Position: Sitting, Cuff Size: Normal)   Pulse 88   Ht 5' 9.5" (1.765 m) Comment: height measured without shoes  Wt 209 lb 1 oz (94.8 kg)   BMI 30.43 kg/m  Wt Readings  from Last 3 Encounters:  11/13/19 209 lb 1 oz (94.8 kg)  11/04/19 204 lb 9.6 oz (92.8 kg)  10/07/19 224 lb 9.6 oz (101.9 kg)  GEN: NAD, appears stated age, doesn't appear chronically ill PSYCH: Cooperative, without pressured speech EYE: Sclerae icteric ENT: MMM CV: Nontachycardic RESP: No audible wheezing GI: NABS, soft, distended, protuberant, fluid shift apparent, shifting dullness present, not tense, no rebound or guarding, unable to appreciate hepatosplenomegaly due to body habitus  GU: Scrotal exam shows evidence of significant swelling/edema, inguinal hernias noted with ascites both considered to likely be direct inguinal hernias MSK/EXT: Bilateral pedal edema present 1+ SKIN: Spider angiomata noted on upper thorax, no jaundice NEURO:  Alert & Oriented x 3, no focal deficits, no evidence of asterixis   REVIEW OF DATA  I reviewed the following data at the time of this encounter:  GI Procedures and Studies  No relevant studies to review  Laboratory Studies  Reviewed those in epic  Imaging Studies  May 2021 CT abdomen pelvis with contrast IMPRESSION: 1. Morphologic features of the liver compatible with cirrhosis. Stigmata of portal venous hypertension including moderate to large volume of ascites. 2. Gallstones. 3. Bilateral inguinal hernias. 4. Umbilical hernia contains fat as well as ascites. 5. Aortic atherosclerosis.   ASSESSMENT  Mr. Ogborn is a 64 y.o. male with a pmh significant for cirrhosis  (complicated by portal hypertension manifested as ascites and anasarca and thrombocytopenia), chronic hepatitis C (currently on treatment), cholelithiasis, anemia, previous chronic alcohol use, inguinal hernia.  The patient is seen today for evaluation and management of:  1. Other cirrhosis of liver ([place])   2. Portal hypertension ([place])   3. Chronic hepatitis C without hepatic coma ([place])   4. Ascites of liver   5. Anasarca   6. Bilateral recurrent inguinal hernia without  obstruction or gangrene   7. Elevated AFP   8. Alcohol use disorder, moderate, in early remission, dependence ([place])    The patient is hemodynamically stable.  He seems to be doing relatively well in the setting of his current treatment for his hepatitis C as well as stabilization of his underlying oral hypertension.  He does have significant anasarca and ascites still present.  Although not significantly tense he does think that there may be some benefit from attempt at further removal.  I think this is reasonable.  We will work on asking her interventional radiology colleagues to help with this as able.  Patient has a history of a significantly elevated AFP although imaging back in May did not show any evidence of a mass or lesion he is due for updated screening and a liver MRI seems very reasonable in the setting of elevated AFP to ensure that we are not missing a mass or lesion that could be a source or cause of his AFP elevation.  We will titrate his diuretics up with spironolactone going from 25 daily to 50 daily.  Did not tolerate nadolol as per the notation by primary care doctor records but it was not being used as a beta-blocker for esophageal varices or gastric varices prophylaxis but rather for blood pressure.  In future should we find that he has evidence of esophageal varices or gastric varices will consider other nonselective beta-blockers or carvedilol versus need for esophageal banding ligation therapy.  The patient is going to work on trying to have his insurance change so that he may continue to follow in this office because as such she cannot afford undergoing a surveillance/screening endoscopy as well as screening colonoscopy which would be recommended in his near compensating state.  He needs to continue his hepatitis C treatment with our infectious disease colleagues we appreciate their help in his treatment.  In regards to his bilateral inguinal hernias, this is most definitely exacerbated  by the increased fluid that he has in his abdomen.  Is not clear that he is a surgical candidate currently but if things persist we may need to consider discussions in the future although things seem to be okay and there is no evidence currently of incarceration.  All patient questions were answered to the best of my ability, and the patient agrees to the aforementioned plan of action with follow-up as indicated.   PLAN  #ESLD Management Volume -Continue Lasix 40 mg daily -Increase spironolactone to 50 mg daily (and if able to tolerate may  need to go up to 100 mg daily for appropriate ratio with loop diuretic) -Obtain standing weight on every other day basis -1500-2000 mg Na diet -LVP with IR no more than 5 L to be removed and 50 g albumin to be administered Infection -No evidence of SBP on clinical exam or prior fluids analysis Bleeding -Needs esophageal varices screening but cannot get right now due to insurance issues Encephalopathy -None currently Screening -Elevated AFP in July of this year need to follow this up with liver MRI which is to be scheduled Transplant -We will need further cessation from alcohol but can consider in future and also need to update [facility] screening Vaccination -Receiving his HAV/HBV vaccinations through Drs. Disease -PCV should administer Influenza, Pneumococcal as per their protocol -Be sure to give Prevnar-13 and in 8-weeks Pneumovax-23 Other -Promote intake of 1.5 g/kg/day of Protein (Ensures/Boosts at each meal) Laboratories as outlined below Liver MRI to be scheduled Monitor bilateral inguinal hernias and holding on surgical evaluation now currently as there is no evidence of incarceration and hopefully with fluid management things will improve Work on scheduling EGD/colonoscopy as soon as able once insurance has been transitioned otherwise he will need to see GI care at another office or other practices in [place] This Encounter   Procedures  . MR LIVER W WO CONTRAST  . Comp Met (CMET)  . INR/PT  . Ambulatory referral to Interventional Radiology    New Prescriptions   SPIRONOLACTONE (ALDACTONE) 50 MG TABLET    Take 1 tablet (50 mg total) by mouth daily.   Modified Medications   No medications on file    Planned Follow Up No follow-ups on file.   Total Time in Face-to-Face and in Coordination of Care for patient including independent/personal interpretation/review of prior testing, medical history, examination, medication adjustment, communicating results with the patient directly, and documentation with the EHR is 35 minutes.   Justice Britain, MD [facility]oscopy Office # [phone]

## 2019-11-13 NOTE — Patient Instructions (Addendum)
INCREASE your Spironolactone to 50mg  once daily.   STAY on current dose of Lasix 40mg  once daily.   Your provider has requested that you go to the basement level for lab work on 11/27/19. Press "B" on the elevator. The lab is located at the first door on the left as you exit the elevator.  Referral has been made for Interventional Radiology. They will contact you with an appointment. If you have not heard from them in 1 week, please call office at [phone].   You have been scheduled for an MRI at [facility] 12/23/19. Your appointment time is 9:00am. Please arrive 30 minutes (8:30am) prior to your appointment time for registration purposes. Please make certain not to have anything to eat or drink 6 hours prior to your test. In addition, if you have any metal in your body, have a pacemaker or defibrillator, please be sure to let your ordering physician know. This test typically takes 45 minutes to 1 hour to complete. Should you need to reschedule, please call [phone] to do so.   It has been recommended to you by your physician that you have a(n) Colon /LEC  Completed in January 2021. Office will contact you to schedule at a later time.   If you are age 13 or older, your body mass index should be between 23-30. Your Body mass index is 30.43 kg/m. If this is out of the aforementioned range listed, please consider follow up with your Primary Care Provider.  If you are age 43 or younger, your body mass index should be between 19-25. Your Body mass index is 30.43 kg/m. If this is out of the aformentioned range listed, please consider follow up with your Primary Care Provider.    Thank you for choosing me and [facility].  Dr. Rush Landmark

## 2019-11-16 ENCOUNTER — Telehealth: Payer: Self-pay

## 2019-11-16 DIAGNOSIS — F1021 Alcohol dependence, in remission: Secondary | ICD-10-CM

## 2019-11-16 DIAGNOSIS — K7469 Other cirrhosis of liver: Secondary | ICD-10-CM | POA: Insufficient documentation

## 2019-11-16 DIAGNOSIS — R772 Abnormality of alphafetoprotein: Secondary | ICD-10-CM | POA: Insufficient documentation

## 2019-11-16 DIAGNOSIS — R188 Other ascites: Secondary | ICD-10-CM | POA: Insufficient documentation

## 2019-11-16 HISTORY — DX: Alcohol dependence, in remission: F10.21

## 2019-11-16 NOTE — Addendum Note (Signed)
Addended by: Debbe Mounts on: 11/16/2019 08:54 AM   Modules accepted: Orders

## 2019-11-16 NOTE — Addendum Note (Signed)
Addended byDebbe Mounts on: 11/16/2019 08:56 AM   Modules accepted: Orders

## 2019-11-16 NOTE — Telephone Encounter (Signed)
Fluid studies added to order.

## 2019-11-16 NOTE — Telephone Encounter (Signed)
-----   Message from Irving Copas., MD sent at 11/16/2019  4:03 AM EDT ----- Regarding: Upcoming IR tap Reinaldo Helt,I do not believe that I told you about what fluid studies to send for this patient.Please send the following:1) fluid cell count2) fluid culture3) total protein, albumin Thanks.GM

## 2019-11-19 ENCOUNTER — Telehealth: Payer: Self-pay

## 2019-11-19 NOTE — Telephone Encounter (Signed)
You have been scheduled for an MRI at [facility] on 12/23/19. Your appointment time is 9:00am. Please arrive 30 minutes prior to your appointment time for registration purposes. Please make certain not to have anything to eat or drink 6 hours prior to your test. In addition, if you have any metal in your body, have a pacemaker or defibrillator, please be sure to let your ordering physician know. This test typically takes 45 minutes to 1 hour to complete. Should you need to reschedule, please call [phone] to do so.

## 2019-11-20 NOTE — Telephone Encounter (Signed)
Pt has been informed of appt for MRI at [facility]. Pt voiced understanding.

## 2019-11-26 MED FILL — SPIRONOLACTONE 50 MG TABS: 50 | 30 days supply | Qty: 30 | Fill #0

## 2019-11-27 ENCOUNTER — Other Ambulatory Visit (INDEPENDENT_AMBULATORY_CARE_PROVIDER_SITE_OTHER): Payer: Medicaid Other

## 2019-11-27 DIAGNOSIS — R601 Generalized edema: Secondary | ICD-10-CM

## 2019-11-27 DIAGNOSIS — K4021 Bilateral inguinal hernia, without obstruction or gangrene, recurrent: Secondary | ICD-10-CM | POA: Diagnosis not present

## 2019-11-27 DIAGNOSIS — K766 Portal hypertension: Secondary | ICD-10-CM | POA: Diagnosis not present

## 2019-11-27 DIAGNOSIS — K7469 Other cirrhosis of liver: Secondary | ICD-10-CM | POA: Diagnosis not present

## 2019-11-27 DIAGNOSIS — R188 Other ascites: Secondary | ICD-10-CM | POA: Diagnosis not present

## 2019-11-27 LAB — COMPREHENSIVE METABOLIC PANEL
ALT: 18 U/L (ref 0–53)
AST: 32 U/L (ref 0–37)
Albumin: 2.9 g/dL — ABNORMAL LOW (ref 3.5–5.2)
Alkaline Phosphatase: 77 U/L (ref 39–117)
BUN: 21 mg/dL (ref 6–23)
CO2: 25 mEq/L (ref 19–32)
Calcium: 8.7 mg/dL (ref 8.4–10.5)
Chloride: 104 mEq/L (ref 96–112)
Creatinine, Ser: 1.1 mg/dL (ref 0.40–1.50)
GFR: 74 mL/min (ref >60.00)
Glucose, Bld: 109 mg/dL — ABNORMAL HIGH (ref 70–99)
Potassium: 3.7 mEq/L (ref 3.5–5.1)
Sodium: 135 mEq/L (ref 135–145)
Total Bilirubin: 1.7 mg/dL — ABNORMAL HIGH (ref 0.2–1.2)
Total Protein: 7.3 g/dL (ref 6.0–8.3)

## 2019-11-27 LAB — PROTIME-INR
INR: 1.3 ratio — ABNORMAL HIGH (ref 0.8–1.0)
Prothrombin Time: 14 s — ABNORMAL HIGH (ref 9.6–13.1)

## 2019-12-03 ENCOUNTER — Telehealth: Payer: Self-pay

## 2019-12-03 NOTE — Telephone Encounter (Signed)
I called and spoke with pt this afternoon to follow-up on Paracentesis. Pt informed me that he has tried reaching out IR because no one had contacted him to schedule. I called and left detailed message on [phone] IR- [facility] asking for update on status of the Paracentesis. Per pt he did have a change in ins, but this has since then been taken care of. I have also advised pt to contact IR once again if he has still not heard from them by Monday or Tues. Order is epic for Paracentesis. Order was sent via Epic. I will also fax it manually.

## 2019-12-04 ENCOUNTER — Ambulatory Visit: Payer: Medicaid Other | Attending: Critical Care Medicine | Admitting: Pharmacist

## 2019-12-04 ENCOUNTER — Other Ambulatory Visit: Payer: Self-pay

## 2019-12-04 DIAGNOSIS — Z23 Encounter for immunization: Secondary | ICD-10-CM

## 2019-12-04 NOTE — Progress Notes (Signed)
Patient presents for vaccination against HepB per orders of Dr. Joya Gaskins. Consent given. Counseling provided. No contraindications exists. Vaccine administered without incident.   Patient should return in Feb for last Hep B dose.  Patient should return in March for last hep A dose.   Richard Yang, PharmD, [employer] [phone]

## 2019-12-07 ENCOUNTER — Other Ambulatory Visit: Payer: Self-pay

## 2019-12-07 DIAGNOSIS — R188 Other ascites: Secondary | ICD-10-CM

## 2019-12-07 DIAGNOSIS — K7469 Other cirrhosis of liver: Secondary | ICD-10-CM

## 2019-12-07 NOTE — Telephone Encounter (Signed)
Pt called to inform that he called IR last Friday to schedule paracentesis and was told that they did not have an order.

## 2019-12-07 NOTE — Telephone Encounter (Signed)
Paracentesis has been ordered as well as albumin order.  The pt has been scheduled for para on 12/09/19 at 9 am at [place].  The pt has been advised and all questions answered.

## 2019-12-07 NOTE — Telephone Encounter (Signed)
I have spoken with Koren Shiver, RN and she is going to work on Catering manager.

## 2019-12-09 ENCOUNTER — Ambulatory Visit (HOSPITAL_COMMUNITY)
Admission: RE | Admit: 2019-12-09 | Discharge: 2019-12-09 | Disposition: A | Discharge status: Home / Self Care | Payer: Medicare Other | Source: Ambulatory Visit | Attending: Gastroenterology | Admitting: Gastroenterology

## 2019-12-09 ENCOUNTER — Other Ambulatory Visit: Payer: Self-pay

## 2019-12-09 DIAGNOSIS — R188 Other ascites: Secondary | ICD-10-CM | POA: Diagnosis not present

## 2019-12-09 DIAGNOSIS — K7469 Other cirrhosis of liver: Secondary | ICD-10-CM | POA: Insufficient documentation

## 2019-12-09 HISTORY — PX: IR PARACENTESIS: IMG2679

## 2019-12-09 LAB — BODY FLUID CELL COUNT WITH DIFFERENTIAL
Eos, Fluid: NONE SEEN %
Lymphs, Fluid: 70 %
Monocyte-Macrophage-Serous Fluid: 29 % — ABNORMAL LOW (ref 50–90)
Neutrophil Count, Fluid: 1 % (ref 0–25)
Total Nucleated Cell Count, Fluid: 390 cu mm (ref 0–1000)

## 2019-12-09 LAB — GRAM STAIN

## 2019-12-09 LAB — PROTEIN, PLEURAL OR PERITONEAL FLUID: Total protein, fluid: <3 g/dL

## 2019-12-09 MED ORDER — LIDOCAINE HCL 1 % IJ SOLN
INTRAMUSCULAR | Status: AC | PRN
  Administered 2019-12-09: 20 mL via INTRADERMAL

## 2019-12-09 MED ORDER — ALBUMIN HUMAN 25 % IV SOLN
50.0000 g | Freq: Once | INTRAVENOUS | Status: AC
  Administered 2019-12-09: 50 g via INTRAVENOUS
  Filled 2019-12-09: qty 200

## 2019-12-09 MED ORDER — LIDOCAINE HCL 1 % IJ SOLN
INTRAMUSCULAR | Status: AC
  Filled 2019-12-09: qty 20

## 2019-12-09 MED ORDER — ALBUMIN HUMAN 25 % IV SOLN
INTRAVENOUS | Status: AC
  Filled 2019-12-09: qty 200

## 2019-12-09 NOTE — Procedures (Signed)
PROCEDURE SUMMARY:  Successful US guided paracentesis from right lateral abdomen.  Yielded 5.0 liters of yellow, clear fluid.  No immediate complications.  Pt tolerated well.   Specimen was sent for labs.  EBL < 31mL  Albumin to be given per order.  Docia Barrier PA-C 12/09/2019 9:22 AM

## 2019-12-10 LAB — PATHOLOGIST SMEAR REVIEW

## 2019-12-12 LAB — TOTAL BILIRUBIN, BODY FLUID: Total bilirubin, fluid: 0.4 mg/dL

## 2019-12-14 ENCOUNTER — Emergency Department (HOSPITAL_COMMUNITY)
Admission: EM | Admit: 2019-12-14 | Discharge: 2019-12-15 | Disposition: A | Discharge status: Home / Self Care | Payer: Medicare Other | Attending: Emergency Medicine | Admitting: Emergency Medicine

## 2019-12-14 ENCOUNTER — Other Ambulatory Visit: Payer: Self-pay

## 2019-12-14 DIAGNOSIS — K429 Umbilical hernia without obstruction or gangrene: Secondary | ICD-10-CM | POA: Insufficient documentation

## 2019-12-14 DIAGNOSIS — R109 Unspecified abdominal pain: Secondary | ICD-10-CM | POA: Diagnosis present

## 2019-12-14 DIAGNOSIS — Z79899 Other long term (current) drug therapy: Secondary | ICD-10-CM | POA: Diagnosis not present

## 2019-12-14 DIAGNOSIS — I10 Essential (primary) hypertension: Secondary | ICD-10-CM | POA: Diagnosis not present

## 2019-12-14 LAB — CULTURE, BODY FLUID W GRAM STAIN -BOTTLE: Culture: NO GROWTH

## 2019-12-14 MED FILL — FUROSEMIDE 40 MG TAB: 40 | 30 days supply | Qty: 30 | Fill #3

## 2019-12-14 MED FILL — ATORVASTATIN CALCIUM 20 MG: 20 | 30 days supply | Qty: 30 | Fill #3

## 2019-12-14 NOTE — ED Triage Notes (Signed)
Pt. Stated, I was running errands this morning and I had on a belt and I must had it too tight around my stomach and hernia at my belly button and Its been in a lot of pain since.

## 2019-12-15 NOTE — ED Notes (Signed)
Pt ambulatory at this time with steady gait.

## 2019-12-15 NOTE — ED Provider Notes (Signed)
[facility] EMERGENCY DEPARTMENT Provider Note   CSN: [id number] Arrival date & time: 12/14/19  1604   History Chief Complaint  Patient presents with  . Hernia  . Abdominal Pain    Richard Yang is a 64 y.o. male.  The history is provided by the patient.  Abdominal Pain He has history of hypertension, cirrhosis of the liver, umbilical hernia and comes in because of his umbilical hernia having popped out.  He states he thinks he may have put on his belt too tight.  He is complaining of a burning pain in across his mid abdomen.  There is no associated nausea or vomiting.  He states he did have a paracentesis done within the past week.  Past Medical History:  Diagnosis Date  . Ascites   . Cirrhosis ([place])   . Elevated AFP   . Hepatitis C   . Inguinal hernia   . Portal hypertension ([place])   . Umbilical hernia     Patient Active Problem List   Diagnosis Date Noted  . Other cirrhosis of liver ([place]) 11/16/2019  . Ascites of liver 11/16/2019  . Elevated AFP 11/16/2019  . Alcohol use disorder, moderate, in early remission, dependence ([place] [address] [place]) 11/16/2019  . Chronic hepatitis C without hepatic coma ([place]) 09/23/2019  . Portal hypertension ([place]) 08/06/2019  . Thrombocytopenia ([place]) 08/06/2019  . Anasarca 08/06/2019  . Umbilical hernia 28/31/5176  . Inguinal hernia 07/13/2019  . Aortic atherosclerosis ([place]) 07/13/2019  . History of alcohol use 07/13/2019  . Liver cirrhosis, alcoholic ([place]) 16/08/3708  . Hypertension 06/24/2019    Past Surgical History:  Procedure Laterality Date  . IR PARACENTESIS  09/16/2019  . IR PARACENTESIS  10/09/2019  . IR PARACENTESIS  12/09/2019  . NO PAST SURGERIES         Family History  Problem Relation Age of Onset  . Stomach cancer Neg Hx   . Colon cancer Neg Hx   . Esophageal cancer Neg Hx   . Pancreatic cancer Neg Hx     Social History   Tobacco Use  . Smoking status: Never Smoker  . Smokeless tobacco: Never Used  Vaping  Use  . Vaping Use: Never used  Substance Use Topics  . Alcohol use: Not Currently    Comment: sober since May  . Drug use: Not Currently    Home Medications Prior to Admission medications   Medication Sig Start Date End Date Taking? Authorizing Provider  atorvastatin (LIPITOR) 20 MG tablet Take 1 tablet (20 mg total) by mouth daily. 09/17/19  Yes Elsie Stain, MD  furosemide (LASIX) 40 MG tablet Take 40 mg by mouth daily.   Yes [provider]  Sofosbuvir-Velpatasvir (EPCLUSA) 400-100 MG TABS Take 1 tablet by mouth daily. Take 1 tablet by mouth daily. 09/24/19  Yes Golden Circle, FNP  spironolactone (ALDACTONE) 50 MG tablet Take 1 tablet (50 mg total) by mouth daily. 11/13/19  Yes Mansouraty, Telford Nab., MD    Allergies    Patient has no known allergies.  Review of Systems   Review of Systems  Gastrointestinal: Positive for abdominal pain.  All other systems reviewed and are negative.   Physical Exam Updated Vital Signs BP (!) 158/76   Pulse 88   Temp 98.1 F (36.7 C) (Oral)   Resp 19   SpO2 98%   Physical Exam Vitals and nursing note reviewed.   64 year old male, resting comfortably and in no acute distress. Vital signs are significant for  elevated blood pressure. Oxygen saturation is 99%, which is normal. Head is normocephalic and atraumatic. PERRLA, EOMI. Oropharynx is clear. Neck is nontender and supple without adenopathy or JVD. Back is nontender and there is no CVA tenderness. Lungs are clear without rales, wheezes, or rhonchi. Chest is nontender. Heart has regular rate and rhythm without murmur. Abdomen is soft, flat, with moderate sized umbilical hernia present.  This is moderately tense but not tender.  There are no other masses or hepatosplenomegaly and peristalsis is normoactive. Extremities have no cyanosis or edema, full range of motion is present. Skin is warm and dry without rash. Neurologic: Mental status is normal, cranial nerves are  intact, there are no motor or sensory deficits.  ED Results / Procedures / Treatments    Procedures Hernia reduction  Date/Time: 12/15/2019 3:14 AM Performed by: Delora Fuel, MD Authorized by: Delora Fuel, MD  Local anesthesia used: no  Anesthesia: Local anesthesia used: no  Sedation: Patient sedated: no  Patient tolerance: patient tolerated the procedure well with no immediate complications Comments: Umbilical hernia reduced with manual pressure with successful reduction and resolution of symptoms.    Medications Ordered in ED Medications - No data to display  ED Course  I have reviewed the triage vital signs and the nursing notes.  MDM Rules/Calculators/A&P Umbilical hernia.  This is reduced manually with resolution of his symptoms.  No indication for lab testing or imaging.  Old records are reviewed confirming outpatient management of cirrhosis with ascites.  Have discussed with patient that he may want to consider having repair of the umbilical hernia as it is likely to come out and get stuck again.  Patient expresses understanding.  Final Clinical Impression(s) / ED Diagnoses Final diagnoses:  Umbilical hernia without obstruction and without gangrene  Elevated blood pressure reading with diagnosis of hypertension    Rx / DC Orders ED Discharge Orders    None       Delora Fuel, MD 88/41/66 [phone]

## 2019-12-15 NOTE — Discharge Instructions (Addendum)
Return if you are having any problems. 

## 2019-12-16 ENCOUNTER — Other Ambulatory Visit: Payer: Medicaid Other | Admitting: Pharmacist

## 2019-12-23 ENCOUNTER — Other Ambulatory Visit: Payer: Self-pay

## 2019-12-23 ENCOUNTER — Ambulatory Visit (HOSPITAL_COMMUNITY)
Admission: RE | Admit: 2019-12-23 | Discharge: 2019-12-23 | Disposition: A | Discharge status: Home / Self Care | Payer: Medicare Other | Source: Ambulatory Visit | Attending: Gastroenterology | Admitting: Gastroenterology

## 2019-12-23 DIAGNOSIS — K766 Portal hypertension: Secondary | ICD-10-CM | POA: Diagnosis present

## 2019-12-23 DIAGNOSIS — K4021 Bilateral inguinal hernia, without obstruction or gangrene, recurrent: Secondary | ICD-10-CM | POA: Diagnosis present

## 2019-12-23 DIAGNOSIS — R601 Generalized edema: Secondary | ICD-10-CM | POA: Diagnosis present

## 2019-12-23 DIAGNOSIS — K7469 Other cirrhosis of liver: Secondary | ICD-10-CM

## 2019-12-23 DIAGNOSIS — R188 Other ascites: Secondary | ICD-10-CM

## 2019-12-23 MED ORDER — GADOBUTROL 1 MMOL/ML IV SOLN
10.0000 mL | Freq: Once | INTRAVENOUS | Status: AC | PRN
  Administered 2019-12-23: 10 mL via INTRAVENOUS

## 2019-12-24 ENCOUNTER — Other Ambulatory Visit: Payer: Self-pay

## 2019-12-24 DIAGNOSIS — K7469 Other cirrhosis of liver: Secondary | ICD-10-CM

## 2019-12-24 DIAGNOSIS — K766 Portal hypertension: Secondary | ICD-10-CM

## 2019-12-24 DIAGNOSIS — R188 Other ascites: Secondary | ICD-10-CM

## 2019-12-29 MED FILL — SPIRONOLACTONE 50 MG TABS: 50 | 30 days supply | Qty: 30 | Fill #1

## 2019-12-30 ENCOUNTER — Other Ambulatory Visit: Payer: Self-pay

## 2019-12-30 DIAGNOSIS — K7469 Other cirrhosis of liver: Secondary | ICD-10-CM

## 2019-12-30 DIAGNOSIS — R188 Other ascites: Secondary | ICD-10-CM

## 2020-01-04 ENCOUNTER — Other Ambulatory Visit: Payer: Self-pay

## 2020-01-04 ENCOUNTER — Ambulatory Visit (INDEPENDENT_AMBULATORY_CARE_PROVIDER_SITE_OTHER): Payer: Medicare Other | Admitting: Pharmacist

## 2020-01-04 DIAGNOSIS — K7031 Alcoholic cirrhosis of liver with ascites: Secondary | ICD-10-CM | POA: Diagnosis not present

## 2020-01-04 DIAGNOSIS — B182 Chronic viral hepatitis C: Secondary | ICD-10-CM | POA: Diagnosis not present

## 2020-01-04 NOTE — Progress Notes (Signed)
HPI: Richard Yang is a 64 y.o. male who presents to the [facility] clinic for Hepatitis C follow-up.  Medication: Epclusa x 24 weeks  Start Date: 10/05/19  Hepatitis C Genotype: 1a  Fibrosis Score: F4 - decompensated cirrhosis (ascites)  Hepatitis C RNA: 489,000 on 09/15/19; <15 on 10/28/19  Patient Active Problem List   Diagnosis Date Noted  . Other cirrhosis of liver ([place]) 11/16/2019  . Ascites of liver 11/16/2019  . Elevated AFP 11/16/2019  . Alcohol use disorder, moderate, in early remission, dependence ([place]) 11/16/2019  . Chronic hepatitis C without hepatic coma ([place]) 09/23/2019  . Portal hypertension ([place]) 08/06/2019  . Thrombocytopenia ([place]) 08/06/2019  . Anasarca 08/06/2019  . Umbilical hernia 94/70/9628  . Inguinal hernia 07/13/2019  . Aortic atherosclerosis ([place]) 07/13/2019  . History of alcohol use 07/13/2019  . Liver cirrhosis, alcoholic ([place]) 36/62/9476  . Hypertension 06/24/2019    Patient's Medications  New Prescriptions   No medications on file  Previous Medications   ATORVASTATIN (LIPITOR) 20 MG TABLET    Take 1 tablet (20 mg total) by mouth daily.   FUROSEMIDE (LASIX) 40 MG TABLET    Take 40 mg by mouth daily.   SOFOSBUVIR-VELPATASVIR (EPCLUSA) 400-100 MG TABS    Take 1 tablet by mouth daily. Take 1 tablet by mouth daily.   SPIRONOLACTONE (ALDACTONE) 50 MG TABLET    Take 1 tablet (50 mg total) by mouth daily.  Modified Medications   No medications on file  Discontinued Medications   No medications on file    Allergies: No Known Allergies  Past Medical History: Past Medical History:  Diagnosis Date  . Ascites   . Cirrhosis ([place])   . Elevated AFP   . Hepatitis C   . Inguinal hernia   . Portal hypertension ([place])   . Umbilical hernia     Social History: Social History   Socioeconomic History  . Marital status: Single    Spouse name: Not on file  . Number of children: Not on file  . Years of education: Not on file  . Highest  education level: Not on file  Occupational History  . Not on file  Tobacco Use  . Smoking status: Never Smoker  . Smokeless tobacco: Never Used  Vaping Use  . Vaping Use: Never used  Substance and Sexual Activity  . Alcohol use: Not Currently    Comment: sober since May  . Drug use: Not Currently  . Sexual activity: Not Currently    Partners: Male  Other Topics Concern  . Not on file  Social History Narrative  . Not on file   Social Determinants of Health   Financial Resource Strain:   . Difficulty of Paying Living Expenses: Not on file  Food Insecurity:   . Worried About Charity fundraiser in the Last Year: Not on file  . Ran Out of Food in the Last Year: Not on file  Transportation Needs:   . Lack of Transportation (Medical): Not on file  . Lack of Transportation (Non-Medical): Not on file  Physical Activity:   . Days of Exercise per Week: Not on file  . Minutes of Exercise per Session: Not on file  Stress:   . Feeling of Stress : Not on file  Social Connections:   . Frequency of Communication with Friends and Family: Not on file  . Frequency of Social Gatherings with Friends and Family: Not on file  . Attends Religious Services: Not on file  .  Active Member of Clubs or Organizations: Not on file  . Attends Archivist Meetings: Not on file  . Marital Status: Not on file    Labs: Hepatitis C Lab Results  Component Value Date   HCVGENOTYPE 1a 09/15/2019   HCVRNAPCRQN <15 (H) 10/28/2019   HCVRNAPCRQN 489,000 (H) 09/15/2019   Hepatitis B Lab Results  Component Value Date   HEPBSAB NON-REACTIVE 09/15/2019   HEPBSAG NON-REACTIVE 09/15/2019   HEPBCAB NON-REACTIVE 09/15/2019   Hepatitis A Lab Results  Component Value Date   HAV NON-REACTIVE 09/15/2019   HIV Lab Results  Component Value Date   HIV NONREACTIVE 04/14/2015   Lab Results  Component Value Date   CREATININE 1.10 11/27/2019   CREATININE 1.05 10/28/2019   CREATININE 1.07 10/07/2019     CREATININE 1.12 09/15/2019   CREATININE 1.13 08/06/2019   Lab Results  Component Value Date   AST 32 11/27/2019   AST 38 (H) 10/28/2019   AST 41 (H) 10/07/2019   ALT 18 11/27/2019   ALT 20 10/28/2019   ALT 23 10/07/2019   INR 1.3 (H) 11/27/2019   INR 1.3 (H) 10/07/2019   INR 1.3 (H) 09/15/2019    Assessment: Richard Yang is here today to follow up for his chronic Hepatitis C infection.  He is on Epclusa for 6 months due to his decompensated cirrhosis. I last saw him in September where he was doing and feeling well.  His Hep C RNA was undetectable at that time. He is now about half way through treatment. He states that he hasn't missed any doses and does not experience any side effects. No issues getting it from the pharmacy. Will check labs again today and have him see Marya Amsler when he has completed treatment. Encouraged him to continue doing well with his adherence.  Plan: - Continue Epclusa for 24 weeks - Hep C RNA, CMET, CBC today - F/u with Greg 3/3 at 845am  Kynedi Profitt L. Tyberius Ryner, PharmD, BCIDP, AAHIVP, CPP Clinical Pharmacist Practitioner Infectious Diseases [facility] 01/04/2020, 9:08 AM

## 2020-01-05 ENCOUNTER — Encounter: Payer: Self-pay | Admitting: Critical Care Medicine

## 2020-01-05 ENCOUNTER — Ambulatory Visit: Payer: Medicaid Other | Attending: Critical Care Medicine | Admitting: Critical Care Medicine

## 2020-01-05 VITALS — BP 150/75 | HR 62 | Temp 98.5°F | Resp 16 | Wt 194.0 lb

## 2020-01-05 DIAGNOSIS — Z23 Encounter for immunization: Secondary | ICD-10-CM

## 2020-01-05 DIAGNOSIS — R601 Generalized edema: Secondary | ICD-10-CM | POA: Diagnosis not present

## 2020-01-05 DIAGNOSIS — F1021 Alcohol dependence, in remission: Secondary | ICD-10-CM | POA: Diagnosis not present

## 2020-01-05 DIAGNOSIS — K4091 Unilateral inguinal hernia, without obstruction or gangrene, recurrent: Secondary | ICD-10-CM | POA: Diagnosis not present

## 2020-01-05 DIAGNOSIS — Z1211 Encounter for screening for malignant neoplasm of colon: Secondary | ICD-10-CM

## 2020-01-05 DIAGNOSIS — I10 Essential (primary) hypertension: Secondary | ICD-10-CM

## 2020-01-05 DIAGNOSIS — K429 Umbilical hernia without obstruction or gangrene: Secondary | ICD-10-CM | POA: Diagnosis not present

## 2020-01-05 DIAGNOSIS — B182 Chronic viral hepatitis C: Secondary | ICD-10-CM

## 2020-01-05 DIAGNOSIS — K7031 Alcoholic cirrhosis of liver with ascites: Secondary | ICD-10-CM

## 2020-01-05 NOTE — Progress Notes (Signed)
Follow up hernia.  Request for referral.

## 2020-01-05 NOTE — Assessment & Plan Note (Signed)
Alcoholic cirrhosis with associated hepatitis C  Patient currently not drinking alcohol  Patient currently under treatment for hepatitis C

## 2020-01-05 NOTE — Assessment & Plan Note (Signed)
Monitor blood pressure for now

## 2020-01-05 NOTE — Assessment & Plan Note (Signed)
No active alcohol use at this time noted

## 2020-01-05 NOTE — Progress Notes (Signed)
Subjective:    Patient ID: Richard Yang, male    DOB: [birth year], 64 y.o.   MRN: [id number] History  64 y.o.M  Hx of ETOH cirrohsis, HTN to establish PCP care   07/13/19 This is a telephone visit follow-up from a May visit with primary care. The patient has history of alcohol induced cirrhotic changes based on emergency room visit in May.  Patient also has hypertension as well.  At the last visit the patient was given Aldactone and Lasix and he has a home blood pressure cuff.  He states his blood pressure now is anywhere from 120/76-130/78.  The patient states the edema in the lower extremities and abdomen are better.  He states he did have side effects on Aldactone is taking furosemide only at this time.  He is no longer been drinking alcohol since Mother's Day in May of this year.  His abdominal girth is gone down.  He does not weigh himself we do not know what his weight actually is.  The patient is thinking about getting a Covid vaccine but is yet to achieve this.  08/06/2019 Patient is seen in return follow-up and this is a face-to-face visit from the last visit which was a telephone visit.  Patient is here to establish further for primary care and follow-up for alcoholic liver cirrhosis with portal hypertension.  The patient does have moderate ascites with umbilical hernia.  Patient also has history of gallstones asymptomatic and atherosclerosis of the aorta from abdominal CT scan done in early May 2021 with an emergency room visit.  Patient initially was placed on Aldactone and furosemide but he had side effects with the Aldactone and had to stop this medication.  He is on furosemide only at 20 mg daily this is his only medication.  Note he does have increased urination but has lost 15 pounds of weight since May and has had reduction in the tense nature of the ascites of the abdomen.  Note there is documentation of prior history of thrombocytopenia and this was documented in May.  Has not yet been  seen by gastroenterology does not have any health insurance.  He has received the first of his during a Covid vaccine series and we have held off on Pneumovax and tetanus until the Covid vaccine series is completed.  His next vaccine is due next Monday. Patient does have hypertension with this and is only on the furosemide and yet with this persistent his hypertensive numbers.  Patient is also due up a hepatitis C screen and colonoscopy  Patient has note some constipation as well.  Patient denies any bleeding from his lower or upper GI tracts.  Patient states the edema in his lower extremities are improved however he still has scrotal edema  Wt Readings from Last 3 Encounters: 08/06/19 : 225 lb (102.1 kg) 06/24/19 : 240 lb (108.9 kg) 09/22/17 : 198 lb (89.8 kg)  09/17/2019 Patient is seen today in follow-up and note this patient has been seen by gastroenterology and they recommended paracentesis which occurred yesterday. He had 5 L of fluid removed. Note hepatitis C antibody is positive and his RNA quantitative did not run correctly therefore we are going to redraw this today  The patient states that his swelling is less since being on the diuretic and since having had the paracentesis. Note his hepatitis a and B studies are negative he therefore he needs a hepatitis A and B vaccine we only have B vaccine at this clinic today  we'll give this to him at this visit. The patient has normal INR liver functions have shown improvement electrolytes are stable he is on the furosemide and atorvastatin but is off the nadolol because of side effects  The patient has follow-up visits with gastroenterology who wished to perform upper endoscopy and colonoscopy also if the hepatitis C RNA quantitative is elevated he will need referral to our [facility] for hepatitis C treatment  The patient stated he did drink 1 glass of alcohol since the last visit I told him he cannot have any alcohol whatsoever and we will connect him  today with our licensed clinical social worker to connect him to alcohol treatment Just went for paracentesis recently.      11/04/2019 Since the last visit the patient is doing well.  He has started an antiviral for hepatitis C.  The patient has had his counseling performed as well for alcohol use and is no longer obtaining alcohol.  He now has Medicaid.  The patient has an upper endoscopy scheduled he did also needs a colonoscopy for cancer screening.  The patient is receiving his second hepatitis B vaccine today and needs his hepatitis A vaccine today.  Patient is also due a Tdap flu shot and Pneumovax 23 valent however these cannot all be given today with his other vaccines note he is already fully vaccinated for Covid and does not need his booster until January based on his last date of vaccination occurred in July  There are no other new complaints.  He had questions about whether he could use elderberry and emergency supplement and I told him he can stay on those vitamins  01/05/2020 Patient is seen today in return follow-up he had gone to the emergency room because of a umbilical hernia with bowel that was entrapped.  Patient has history of cirrhotic liver disease on the basis of hepatitis C and is currently under treatment with hepatitis C therapy.  Patient is also receiving treatment for his portal hypertension with Aldactone and furosemide.  The patient had a recent paracentesis per gastroenterology and the patient states following that that is when his umbilical hernia worsened.  He also has difficulty with worsening of a right inguinal hernia with now scrotal edema that is developed.  The patient notes no other complaints at this time The patient has finished his course of hepatitis B vaccines Patient is now due for flu shot Tdap and Pneumovax  Patient is also due a colonoscopy   Past Medical History:  Diagnosis Date  . Ascites   . Cirrhosis ([place])   . Elevated AFP   . Hepatitis  C   . Inguinal hernia   . Portal hypertension ([place])   . Umbilical hernia      Family History  Problem Relation Age of Onset  . Stomach cancer Neg Hx   . Colon cancer Neg Hx   . Esophageal cancer Neg Hx   . Pancreatic cancer Neg Hx      Social History   Socioeconomic History  . Marital status: Single    Spouse name: Not on file  . Number of children: Not on file  . Years of education: Not on file  . Highest education level: Not on file  Occupational History  . Not on file  Tobacco Use  . Smoking status: Never Smoker  . Smokeless tobacco: Never Used  Vaping Use  . Vaping Use: Never used  Substance and Sexual Activity  . Alcohol use: Not Currently  Comment: sober since May  . Drug use: Not Currently  . Sexual activity: Not Currently    Partners: Male  Other Topics Concern  . Not on file  Social History Narrative  . Not on file   Social Determinants of Health   Financial Resource Strain:   . Difficulty of Paying Living Expenses: Not on file  Food Insecurity:   . Worried About Charity fundraiser in the Last Year: Not on file  . Ran Out of Food in the Last Year: Not on file  Transportation Needs:   . Lack of Transportation (Medical): Not on file  . Lack of Transportation (Non-Medical): Not on file  Physical Activity:   . Days of Exercise per Week: Not on file  . Minutes of Exercise per Session: Not on file  Stress:   . Feeling of Stress : Not on file  Social Connections:   . Frequency of Communication with Friends and Family: Not on file  . Frequency of Social Gatherings with Friends and Family: Not on file  . Attends Religious Services: Not on file  . Active Member of Clubs or Organizations: Not on file  . Attends Archivist Meetings: Not on file  . Marital Status: Not on file  Intimate Partner Violence:   . Fear of Current or Ex-Partner: Not on file  . Emotionally Abused: Not on file  . Physically Abused: Not on file  . Sexually Abused: Not  on file     No Known Allergies   Outpatient Medications Prior to Visit  Medication Sig Dispense Refill  . atorvastatin (LIPITOR) 20 MG tablet Take 1 tablet (20 mg total) by mouth daily. 90 tablet 3  . furosemide (LASIX) 40 MG tablet Take 40 mg by mouth daily.    . Sofosbuvir-Velpatasvir (EPCLUSA) 400-100 MG TABS Take 1 tablet by mouth daily. Take 1 tablet by mouth daily. 28 tablet 5  . spironolactone (ALDACTONE) 100 MG tablet Take 100 mg by mouth daily.    Marland Kitchen spironolactone (ALDACTONE) 50 MG tablet Take 1 tablet (50 mg total) by mouth daily. 30 tablet 2   No facility-administered medications prior to visit.    Review of Systems  Constitutional: Positive for activity change. Negative for fatigue.  HENT: Negative.   Respiratory: Negative.   Cardiovascular: Negative for chest pain, palpitations and leg swelling.       Edema in LE. abd ascites  Gastrointestinal: Positive for abdominal distention and constipation. Negative for anal bleeding and blood in stool.       Right inguinal and groin swelling  Endocrine: Negative for polyuria.  Genitourinary: Positive for scrotal swelling. Negative for enuresis, frequency and hematuria.  Musculoskeletal: Negative.   Neurological: Negative.   Psychiatric/Behavioral: Negative.        Objective:   Physical Exam Vitals:   01/05/20 0837  BP: (!) 150/75  Pulse: 62  Resp: 16  Temp: 98.5 F (36.9 C)  SpO2: 98%  Weight: 194 lb (88 kg)    Gen: Pleasant, well-nourished, in no distress,  normal affect  ENT: No lesions,  mouth clear,  oropharynx clear, no postnasal drip  Neck: No JVD, no TMG, no carotid bruits  Lungs: No use of accessory muscles, no dullness to percussion, clear without rales or rhonchi  Cardiovascular: RRR, heart sounds normal, no murmur or gallops, 2+ peripheral edema  Abdomen: Ascites has significantly reduced and scrotal edema is gone leaving behind an apparent right inguinal hernia with bowel compartment in place.   Umbilicus  hernia is present but is easily reducible.     Musculoskeletal: No deformities, no cyanosis or clubbing  Neuro: alert, non focal  Skin: Warm, no lesions or rashes  Hepatic Function Latest Ref Rng & Units 01/04/2020 11/27/2019 10/28/2019  Total Protein 6.1 - 8.1 g/dL 7.4 7.3 7.3  Albumin 3.5 - 5.2 g/dL - 2.9(L) -  AST 10 - 35 U/L 29 32 38(H)  ALT 9 - 46 U/L $Remo'15 18 20  'YqChS$ Alk Phosphatase 39 - 117 U/L - 77 -  Total Bilirubin 0.2 - 1.2 mg/dL 1.5(H) 1.7(H) 1.9(H)  Bilirubin, Direct 0.0 - 0.3 mg/dL - - -   BMP Latest Ref Rng & Units 01/04/2020 11/27/2019 10/28/2019  Glucose 65 - 99 mg/dL 97 109(H) 91  BUN 7 - 25 mg/dL $Remove'21 21 21  'ihQoEzA$ Creatinine 0.70 - 1.25 mg/dL 1.17 1.10 1.05  BUN/Creat Ratio 6 - 22 (calc) NOT APPLICABLE - NOT APPLICABLE  Sodium 102 - 146 mmol/L 137 135 137  Potassium 3.5 - 5.3 mmol/L 3.6 3.7 4.3  Chloride 98 - 110 mmol/L 105 104 106  CO2 20 - 32 mmol/L $RemoveB'25 25 23  'vsAaWHUc$ Calcium 8.6 - 10.3 mg/dL 9.1 8.7 8.9   CBC Latest Ref Rng & Units 01/04/2020 10/28/2019 10/07/2019  WBC 3.8 - 10.8 Thousand/uL 4.4 6.0 5.8  Hemoglobin 13.2 - 17.1 g/dL 12.5(L) 11.9(L) 11.7(L)  Hematocrit 38 - 50 % 35.7(L) 33.4(L) 34.1(L)  Platelets 140 - 400 Thousand/uL 64(L) 76(L) 89.0(L)         Assessment & Plan:  I personally reviewed all images and lab data in the [facility] system as well as any outside material available during this office visit and agree with the  radiology impressions.   Inguinal hernia Inguinal hernia which is worsening will need to refer to surgery for evaluation  Anasarca Anasarca has resolved  Alcohol use disorder, moderate, in early remission, dependence ([place]) No active alcohol use at this time noted  Liver cirrhosis, alcoholic ([place]) Alcoholic cirrhosis with associated hepatitis C  Patient currently not drinking alcohol  Patient currently under treatment for hepatitis C  Chronic hepatitis C without hepatic coma ([place]) As per hepatitis C clinic  Hypertension Monitor  blood pressure for now  Umbilical hernia Progressive umbilical hernia will refer to general surgery   Archie was seen today for follow-up.  Diagnoses and all orders for this visit:  Colon cancer screening -     Ambulatory referral to Gastroenterology  Umbilical hernia without obstruction and without gangrene -     Ambulatory referral to General Surgery  Unilateral recurrent inguinal hernia without obstruction or gangrene -     Ambulatory referral to General Surgery  Need for immunization against influenza -     Flu Vaccine QUAD 36+ mos IM  Anasarca  Alcohol use disorder, moderate, in early remission, dependence ([place])  Alcoholic cirrhosis of liver with ascites ([place])  Chronic hepatitis C without hepatic coma ([place])  Primary hypertension   Patient needs colonoscopy will refer back to gastroenterology for same

## 2020-01-05 NOTE — Assessment & Plan Note (Signed)
Inguinal hernia which is worsening will need to refer to surgery for evaluation

## 2020-01-05 NOTE — Patient Instructions (Signed)
A referral to surgery for your hernias will be made  A colonoscopy referral to gastroenterology will be made  No medication changes  Return in a few weeks for a nurse visit for Tdap and Pneumovax 23 vaccines  Return Dr Joya Gaskins 2 months

## 2020-01-05 NOTE — Assessment & Plan Note (Signed)
Anasarca has resolved

## 2020-01-05 NOTE — Assessment & Plan Note (Signed)
As per hepatitis C clinic

## 2020-01-05 NOTE — Assessment & Plan Note (Signed)
Progressive umbilical hernia will refer to general surgery

## 2020-01-06 LAB — CBC
HCT: 35.7 % — ABNORMAL LOW (ref 38.5–50.0)
Hemoglobin: 12.5 g/dL — ABNORMAL LOW (ref 13.2–17.1)
MCH: 32.3 pg (ref 27.0–33.0)
MCHC: 35 g/dL (ref 32.0–36.0)
MCV: 92.2 fL (ref 80.0–100.0)
MPV: 12.6 fL — ABNORMAL HIGH (ref 7.5–12.5)
Platelets: 64 10*3/uL — ABNORMAL LOW (ref 140–400)
RBC: 3.87 10*6/uL — ABNORMAL LOW (ref 4.20–5.80)
RDW: 15.6 % — ABNORMAL HIGH (ref 11.0–15.0)
WBC: 4.4 10*3/uL (ref 3.8–10.8)

## 2020-01-06 LAB — COMPREHENSIVE METABOLIC PANEL
AG Ratio: 0.8 (calc) — ABNORMAL LOW (ref 1.0–2.5)
ALT: 15 U/L (ref 9–46)
AST: 29 U/L (ref 10–35)
Albumin: 3.2 g/dL — ABNORMAL LOW (ref 3.6–5.1)
Alkaline phosphatase (APISO): 76 U/L (ref 35–144)
BUN: 21 mg/dL (ref 7–25)
CO2: 25 mmol/L (ref 20–32)
Calcium: 9.1 mg/dL (ref 8.6–10.3)
Chloride: 105 mmol/L (ref 98–110)
Creat: 1.17 mg/dL (ref 0.70–1.25)
Globulin: 4.2 g/dL (calc) — ABNORMAL HIGH (ref 1.9–3.7)
Glucose, Bld: 97 mg/dL (ref 65–99)
Potassium: 3.6 mmol/L (ref 3.5–5.3)
Sodium: 137 mmol/L (ref 135–146)
Total Bilirubin: 1.5 mg/dL — ABNORMAL HIGH (ref 0.2–1.2)
Total Protein: 7.4 g/dL (ref 6.1–8.1)

## 2020-01-06 LAB — HEPATITIS C RNA QUANTITATIVE
HCV Quantitative Log: <1.18 log IU/mL
HCV RNA, PCR, QN: <15 IU/mL

## 2020-01-15 ENCOUNTER — Other Ambulatory Visit: Payer: Medicaid Other | Admitting: Pharmacist

## 2020-01-15 MED FILL — ATORVASTATIN CALCIUM 20 MG: 20 | 30 days supply | Qty: 30 | Fill #4

## 2020-01-16 ENCOUNTER — Other Ambulatory Visit: Payer: Self-pay

## 2020-01-16 ENCOUNTER — Emergency Department (HOSPITAL_COMMUNITY)
Admission: EM | Admit: 2020-01-16 | Discharge: 2020-01-16 | Disposition: A | Discharge status: Home / Self Care | Payer: Medicare Other | Attending: Emergency Medicine | Admitting: Emergency Medicine

## 2020-01-16 ENCOUNTER — Encounter (HOSPITAL_COMMUNITY): Payer: Self-pay | Admitting: *Deleted

## 2020-01-16 DIAGNOSIS — Z79899 Other long term (current) drug therapy: Secondary | ICD-10-CM | POA: Insufficient documentation

## 2020-01-16 DIAGNOSIS — I10 Essential (primary) hypertension: Secondary | ICD-10-CM | POA: Insufficient documentation

## 2020-01-16 DIAGNOSIS — K429 Umbilical hernia without obstruction or gangrene: Secondary | ICD-10-CM | POA: Insufficient documentation

## 2020-01-16 DIAGNOSIS — R1033 Periumbilical pain: Secondary | ICD-10-CM | POA: Diagnosis present

## 2020-01-16 LAB — COMPREHENSIVE METABOLIC PANEL
ALT: 22 U/L (ref 0–44)
AST: 49 U/L — ABNORMAL HIGH (ref 15–41)
Albumin: 3.5 g/dL (ref 3.5–5.0)
Alkaline Phosphatase: 93 U/L (ref 38–126)
Anion gap: 11 (ref 5–15)
BUN: 29 mg/dL — ABNORMAL HIGH (ref 8–23)
CO2: 22 mmol/L (ref 22–32)
Calcium: 9.1 mg/dL (ref 8.9–10.3)
Chloride: 102 mmol/L (ref 98–111)
Creatinine, Ser: 1.21 mg/dL (ref 0.61–1.24)
GFR, Estimated: >60 mL/min (ref >60)
Glucose, Bld: 120 mg/dL — ABNORMAL HIGH (ref 70–99)
Potassium: 4.4 mmol/L (ref 3.5–5.1)
Sodium: 135 mmol/L (ref 135–145)
Total Bilirubin: 1.2 mg/dL (ref 0.3–1.2)
Total Protein: 8.7 g/dL — ABNORMAL HIGH (ref 6.5–8.1)

## 2020-01-16 LAB — CBC
HCT: 41 % (ref 39.0–52.0)
Hemoglobin: 13.6 g/dL (ref 13.0–17.0)
MCH: 31.5 pg (ref 26.0–34.0)
MCHC: 33.2 g/dL (ref 30.0–36.0)
MCV: 94.9 fL (ref 80.0–100.0)
Platelets: 79 10*3/uL — ABNORMAL LOW (ref 150–400)
RBC: 4.32 MIL/uL (ref 4.22–5.81)
RDW: 16.7 % — ABNORMAL HIGH (ref 11.5–15.5)
WBC: 6.3 10*3/uL (ref 4.0–10.5)
nRBC: 0 % (ref 0.0–0.2)

## 2020-01-16 LAB — URINALYSIS, ROUTINE W REFLEX MICROSCOPIC
Bilirubin Urine: NEGATIVE
Glucose, UA: NEGATIVE mg/dL
Hgb urine dipstick: NEGATIVE
Ketones, ur: NEGATIVE mg/dL
Leukocytes,Ua: NEGATIVE
Nitrite: NEGATIVE
Protein, ur: NEGATIVE mg/dL
Specific Gravity, Urine: 1.02 (ref 1.005–1.030)
pH: 6 (ref 5.0–8.0)

## 2020-01-16 LAB — LIPASE, BLOOD: Lipase: 75 U/L — ABNORMAL HIGH (ref 11–51)

## 2020-01-16 MED ORDER — KETOROLAC TROMETHAMINE 15 MG/ML IJ SOLN
15.0000 mg | Freq: Once | INTRAMUSCULAR | Status: AC
  Administered 2020-01-16: 22:00:00 15 mg via INTRAMUSCULAR
  Filled 2020-01-16: qty 1

## 2020-01-16 NOTE — Discharge Instructions (Addendum)
Return for any problem.  Follow-up with your care provider as instructed.  Follow-up with general surgery as previously scheduled for further evaluation of your hernia.Richard Yang

## 2020-01-16 NOTE — ED Triage Notes (Addendum)
BIB EMS with abd/hernia pain for last 1.5 hours. 172/90-86-18-98% CBG 125

## 2020-01-16 NOTE — ED Provider Notes (Signed)
[facility] DEPT Provider Note   CSN: [id number] Arrival date & time: 01/16/20  1706     History Chief Complaint  Patient presents with  . Abdominal Pain    Richard Yang is a 64 y.o. male.  64 year old male with prior medical history as detailed below presents for evaluation of umbilical hernia pain.  Patient with longstanding history of umbilical hernia.  Patient reports that today while doing groceries umbilical hernia "popped out."  He complains of pain around his umbilicus.  He denies associated nausea or vomiting.  He denies fever.  He denies other abdominal pain.  He does have a previously scheduled appointment with general surgery early next month for evaluation of possible repair of his umbilical hernia.  Patient reports that he attempted to reduce his hernia at home but was unable to do so.  The history is provided by the patient and medical records.  Abdominal Pain Pain location:  Periumbilical Pain radiates to:  Does not radiate Pain severity:  Mild Onset quality:  Sudden Duration:  3 hours Timing:  Constant Progression:  Unchanged Chronicity:  Recurrent      Past Medical History:  Diagnosis Date  . Ascites   . Cirrhosis ([place])   . Elevated AFP   . Hepatitis C   . Inguinal hernia   . Portal hypertension ([place])   . Umbilical hernia     Patient Active Problem List   Diagnosis Date Noted  . Other cirrhosis of liver ([place]) 11/16/2019  . Ascites of liver 11/16/2019  . Elevated AFP 11/16/2019  . Alcohol use disorder, moderate, in early remission, dependence ([place]) 11/16/2019  . Chronic hepatitis C without hepatic coma ([place]) 09/23/2019  . Portal hypertension ([place]) 08/06/2019  . Thrombocytopenia ([place]) 08/06/2019  . Umbilical hernia 16/38/4665  . Inguinal hernia 07/13/2019  . Aortic atherosclerosis ([place]) 07/13/2019  . History of alcohol use 07/13/2019  . Liver cirrhosis, alcoholic ([place]) 99/35/7017  . Hypertension 06/24/2019     Past Surgical History:  Procedure Laterality Date  . IR PARACENTESIS  09/16/2019  . IR PARACENTESIS  10/09/2019  . IR PARACENTESIS  12/09/2019  . NO PAST SURGERIES         Family History  Problem Relation Age of Onset  . Stomach cancer Neg Hx   . Colon cancer Neg Hx   . Esophageal cancer Neg Hx   . Pancreatic cancer Neg Hx     Social History   Tobacco Use  . Smoking status: Never Smoker  . Smokeless tobacco: Never Used  Vaping Use  . Vaping Use: Never used  Substance Use Topics  . Alcohol use: Not Currently    Comment: sober since May  . Drug use: Not Currently    Home Medications Prior to Admission medications   Medication Sig Start Date End Date Taking? Authorizing Provider  atorvastatin (LIPITOR) 20 MG tablet Take 1 tablet (20 mg total) by mouth daily. 09/17/19   Elsie Stain, MD  furosemide (LASIX) 40 MG tablet Take 40 mg by mouth daily.    [provider]  Sofosbuvir-Velpatasvir (EPCLUSA) 400-100 MG TABS Take 1 tablet by mouth daily. Take 1 tablet by mouth daily. 09/24/19   Golden Circle, FNP  spironolactone (ALDACTONE) 100 MG tablet Take 100 mg by mouth daily.    [provider]    Allergies    Patient has no known allergies.  Review of Systems   Review of Systems  Gastrointestinal: Positive for abdominal pain.  All other  systems reviewed and are negative.   Physical Exam Updated Vital Signs BP (!) 161/84 (BP Location: Right Arm)   Pulse 85   Temp 98 F (36.7 C) (Oral)   Resp 16   Wt 88 kg   SpO2 100%   BMI 28.24 kg/m   Physical Exam Vitals and nursing note reviewed.  Constitutional:      General: He is not in acute distress.    Appearance: He is well-developed and well-nourished.  HENT:     Head: Normocephalic and atraumatic.     Mouth/Throat:     Mouth: Oropharynx is clear and moist.  Eyes:     Extraocular Movements: EOM normal.     Conjunctiva/sclera: Conjunctivae normal.     Pupils: Pupils are equal, round,  and reactive to light.  Cardiovascular:     Rate and Rhythm: Normal rate and regular rhythm.     Heart sounds: Normal heart sounds.  Pulmonary:     Effort: Pulmonary effort is normal. No respiratory distress.     Breath sounds: Normal breath sounds.  Abdominal:     General: There is no distension.     Palpations: Abdomen is soft.     Tenderness: There is no abdominal tenderness.     Hernia: A hernia is present. Hernia is present in the umbilical area.     Comments: Peri-umbilical hernia - reducible   Musculoskeletal:        General: No deformity or edema. Normal range of motion.     Cervical back: Normal range of motion and neck supple.  Skin:    General: Skin is warm and dry.  Neurological:     Mental Status: He is alert and oriented to person, place, and time.  Psychiatric:        Mood and Affect: Mood and affect normal.     ED Results / Procedures / Treatments   Labs (all labs ordered are listed, but only abnormal results are displayed) Labs Reviewed  LIPASE, BLOOD - Abnormal; Notable for the following components:      Result Value   Lipase 75 (*)    All other components within normal limits  COMPREHENSIVE METABOLIC PANEL - Abnormal; Notable for the following components:   Glucose, Bld 120 (*)    BUN 29 (*)    Total Protein 8.7 (*)    AST 49 (*)    All other components within normal limits  CBC - Abnormal; Notable for the following components:   RDW 16.7 (*)    Platelets 79 (*)    All other components within normal limits  URINALYSIS, ROUTINE W REFLEX MICROSCOPIC    EKG None  Radiology No results found.  Procedures Hernia reduction  Date/Time: 01/16/2020 10:06 PM Performed by: Valarie Merino, MD Authorized by: Valarie Merino, MD  Consent: Verbal consent obtained. Risks and benefits: risks, benefits and alternatives were discussed Consent given by: patient Patient understanding: patient states understanding of the procedure being performed Patient  consent: the patient's understanding of the procedure matches consent given Procedure consent: procedure consent matches procedure scheduled Relevant documents: relevant documents present and verified Test results: test results available and properly labeled Site marked: the operative site was marked Imaging studies: imaging studies available Patient identity confirmed: verbally with patient Time out: Immediately prior to procedure a "time out" was called to verify the correct patient, procedure, equipment, support staff and site/side marked as required. Local anesthesia used: no  Anesthesia: Local anesthesia used: no  Sedation: Patient  sedated: no  Patient tolerance: patient tolerated the procedure well with no immediate complications Comments: Umbilical hernia reduced without difficulty at bedside    (including critical care time)  Medications Ordered in ED Medications - No data to display  ED Course  I have reviewed the triage vital signs and the nursing notes.  Pertinent labs & imaging results that were available during my care of the patient were reviewed by me and considered in my medical decision making (see chart for details).    MDM Rules/Calculators/A&P                          MDM  Screen complete  Richard Yang was evaluated in Emergency Department on 01/16/2020 for the symptoms described in the history of present illness. He was evaluated in the context of the global COVID-19 pandemic, which necessitated consideration that the patient might be at risk for infection with the SARS-CoV-2 virus that causes COVID-19. Institutional protocols and algorithms that pertain to the evaluation of patients at risk for COVID-19 are in a state of rapid change based on information released by regulatory bodies including the CDC and federal and state organizations. These policies and algorithms were followed during the patient's care in the ED.  Patient is presenting for evaluation of  his umbilical hernia.  Patient with longstanding history of same.  Patient's hernia is reducible with easy pressure applied at bedside.  Patient tolerated this well without need for sedation or pain medication.  Patient without evidence of obstruction or other pathology on exam or history.  Patient does have already established outpatient follow-up with general surgery.  Importance of close follow-up is stressed.  Strict return precautions given and understood.  Final Clinical Impression(s) / ED Diagnoses Final diagnoses:  Umbilical hernia without obstruction and without gangrene    Rx / DC Orders ED Discharge Orders    None       Valarie Merino, MD 01/16/20 2206

## 2020-01-18 ENCOUNTER — Telehealth: Payer: Self-pay

## 2020-01-18 ENCOUNTER — Other Ambulatory Visit: Payer: Self-pay

## 2020-01-18 MED ORDER — SOFOSBUVIR-VELPATASVIR 400-100 MG PO TABS: 1.0000 | ORAL_TABLET | Freq: Every day | ORAL | 5 refills | Status: DC

## 2020-01-18 NOTE — Telephone Encounter (Signed)
[facility] Patient Advocate Encounter  Patient's Epclusa medication will be overnight shipped to him from [place].  Ileene Patrick , [facility] Patient [facility] Phone: [phone] Fax:  [phone]

## 2020-01-28 ENCOUNTER — Other Ambulatory Visit: Payer: Self-pay

## 2020-01-28 ENCOUNTER — Ambulatory Visit: Payer: Medicare Other | Attending: Family Medicine | Admitting: Pharmacist

## 2020-01-28 DIAGNOSIS — Z23 Encounter for immunization: Secondary | ICD-10-CM | POA: Diagnosis not present

## 2020-01-28 MED FILL — SPIRONOLACTONE 50 MG TABS: 50 | 30 days supply | Qty: 30 | Fill #2

## 2020-01-28 NOTE — Progress Notes (Signed)
Patient presents for vaccination against strep pneumo per orders of Dr. Joya Gaskins. Consent given. Counseling provided. No contraindications exists. Vaccine administered without incident.   Of note, he is due for his Tdap booster but has elected to receive this at a later date. Will see him back in 2 weeks.   Benard Halsted, PharmD, [place], [place] [phone]

## 2020-02-11 ENCOUNTER — Other Ambulatory Visit: Payer: Self-pay | Admitting: Critical Care Medicine

## 2020-02-11 ENCOUNTER — Ambulatory Visit: Payer: Medicare Other | Attending: Critical Care Medicine | Admitting: Pharmacist

## 2020-02-11 ENCOUNTER — Encounter: Payer: Self-pay | Admitting: Pharmacist

## 2020-02-11 ENCOUNTER — Other Ambulatory Visit: Payer: Self-pay

## 2020-02-11 DIAGNOSIS — Z23 Encounter for immunization: Secondary | ICD-10-CM

## 2020-02-11 MED ORDER — SPIRONOLACTONE 100 MG PO TABS
100.0000 mg | ORAL_TABLET | Freq: Every day | ORAL | 2 refills | Status: DC
Start: 2020-02-11 — End: 2020-02-11

## 2020-02-11 MED FILL — SPIRONOLACTONE 100 MG TAB: 100 | 30 days supply | Qty: 30 | Fill #0

## 2020-02-11 NOTE — Progress Notes (Signed)
Patient presents for vaccination against tetanus per orders of Dr. Delford Field. Consent given. Counseling provided. No contraindications exists. Vaccine administered without incident.   Butch Penny, PharmD, [place], CPP Clinical Pharmacist [facility] [place] [phone]

## 2020-02-16 ENCOUNTER — Other Ambulatory Visit: Payer: Self-pay

## 2020-02-16 ENCOUNTER — Encounter (HOSPITAL_COMMUNITY): Payer: Self-pay

## 2020-02-16 DIAGNOSIS — I10 Essential (primary) hypertension: Secondary | ICD-10-CM | POA: Diagnosis not present

## 2020-02-16 DIAGNOSIS — Z79899 Other long term (current) drug therapy: Secondary | ICD-10-CM | POA: Insufficient documentation

## 2020-02-16 DIAGNOSIS — K429 Umbilical hernia without obstruction or gangrene: Secondary | ICD-10-CM | POA: Insufficient documentation

## 2020-02-16 DIAGNOSIS — K7460 Unspecified cirrhosis of liver: Secondary | ICD-10-CM | POA: Diagnosis not present

## 2020-02-16 NOTE — ED Triage Notes (Signed)
Pt presents with c/o umbilical hernia. Pt is here to have it reduced, hx of same.

## 2020-02-16 NOTE — ED Provider Notes (Signed)
MSE was initiated and I personally evaluated the patient and placed orders (if any) at  4:41 PM on February 16, 2020.  He presents to the emergency department complaining of abdominal pain at the site of his umbilical hernia that started at 1 PM today. He has a history of prior similar episodes of his hernia becoming stuck. He is unable to reduce hernia on his own. He denies any fevers, chest pain, nausea, vomiting, recent illnesses.  Attempted hernia reduction on patient's initial ED presentation without success, will require pain medications and further evaluation.  The patient appears stable so that the remainder of the MSE may be completed by another provider.   Quintella Reichert, MD 02/16/20 2205

## 2020-02-17 ENCOUNTER — Emergency Department (HOSPITAL_COMMUNITY): Payer: Medicare Other

## 2020-02-17 ENCOUNTER — Encounter (HOSPITAL_COMMUNITY): Payer: Self-pay

## 2020-02-17 ENCOUNTER — Emergency Department (HOSPITAL_COMMUNITY)
Admission: EM | Admit: 2020-02-17 | Discharge: 2020-02-17 | Disposition: A | Discharge status: Home / Self Care | Payer: Medicare Other | Attending: Emergency Medicine | Admitting: Emergency Medicine

## 2020-02-17 DIAGNOSIS — K429 Umbilical hernia without obstruction or gangrene: Secondary | ICD-10-CM

## 2020-02-17 LAB — CBC WITH DIFFERENTIAL/PLATELET
Abs Immature Granulocytes: 0.01 10*3/uL (ref 0.00–0.07)
Basophils Absolute: 0 10*3/uL (ref 0.0–0.1)
Basophils Relative: 0 %
Eosinophils Absolute: 0 10*3/uL (ref 0.0–0.5)
Eosinophils Relative: 0 %
HCT: 36.2 % — ABNORMAL LOW (ref 39.0–52.0)
Hemoglobin: 12.3 g/dL — ABNORMAL LOW (ref 13.0–17.0)
Immature Granulocytes: 0 %
Lymphocytes Relative: 13 %
Lymphs Abs: 0.9 10*3/uL (ref 0.7–4.0)
MCH: 31.7 pg (ref 26.0–34.0)
MCHC: 34 g/dL (ref 30.0–36.0)
MCV: 93.3 fL (ref 80.0–100.0)
Monocytes Absolute: 0.4 10*3/uL (ref 0.1–1.0)
Monocytes Relative: 5 %
Neutro Abs: 5.9 10*3/uL (ref 1.7–7.7)
Neutrophils Relative %: 82 %
Platelets: 71 10*3/uL — ABNORMAL LOW (ref 150–400)
RBC: 3.88 MIL/uL — ABNORMAL LOW (ref 4.22–5.81)
RDW: 15.7 % — ABNORMAL HIGH (ref 11.5–15.5)
WBC: 7.2 10*3/uL (ref 4.0–10.5)
nRBC: 0 % (ref 0.0–0.2)

## 2020-02-17 LAB — BASIC METABOLIC PANEL
Anion gap: 9 (ref 5–15)
BUN: 20 mg/dL (ref 8–23)
CO2: 20 mmol/L — ABNORMAL LOW (ref 22–32)
Calcium: 9.2 mg/dL (ref 8.9–10.3)
Chloride: 105 mmol/L (ref 98–111)
Creatinine, Ser: 0.95 mg/dL (ref 0.61–1.24)
GFR, Estimated: >60 mL/min (ref >60)
Glucose, Bld: 133 mg/dL — ABNORMAL HIGH (ref 70–99)
Potassium: 4.3 mmol/L (ref 3.5–5.1)
Sodium: 134 mmol/L — ABNORMAL LOW (ref 135–145)

## 2020-02-17 LAB — PROTIME-INR
INR: 1.2 (ref 0.8–1.2)
Prothrombin Time: 14.9 seconds (ref 11.4–15.2)

## 2020-02-17 MED ORDER — HYDROMORPHONE HCL 1 MG/ML IJ SOLN
1.0000 mg | Freq: Once | INTRAMUSCULAR | Status: AC
  Administered 2020-02-17: 1 mg via INTRAVENOUS
  Filled 2020-02-17: qty 1

## 2020-02-17 MED ORDER — ONDANSETRON HCL 4 MG/2ML IJ SOLN
4.0000 mg | Freq: Once | INTRAMUSCULAR | Status: AC
  Administered 2020-02-17: 4 mg via INTRAVENOUS
  Filled 2020-02-17: qty 2

## 2020-02-17 MED ORDER — HYDROMORPHONE HCL 1 MG/ML IJ SOLN
0.5000 mg | Freq: Once | INTRAMUSCULAR | Status: AC
  Administered 2020-02-17: 0.5 mg via INTRAVENOUS
  Filled 2020-02-17: qty 1

## 2020-02-17 MED ORDER — IOHEXOL 300 MG/ML  SOLN
100.0000 mL | Freq: Once | INTRAMUSCULAR | Status: AC | PRN
  Administered 2020-02-17: 100 mL via INTRAVENOUS

## 2020-02-17 NOTE — Consult Note (Signed)
Consulting Physician: Perry  Referring Provider: Sherol Dade  Chief Complaint: Hernia  Reason for Consult: Possibly incarcerated umbilical hernia   Subjective   HPI: Richard Yang is an 65 y.o. male who is here for a problem with his umbilical hernia.  He has had umbilical hernia from some time and has a schedule with Dr. Harlow Asa to have this fixed, however over the last day the hernia has become very hard and tender.  The ER providers were unable to reduce it.  The patient has had normal bowel function has been eating well.  He has not noticed any nausea or vomiting.  He is concerned about the hernia so he presented to the hospital to be evaluated.  The ER providers were unable to reduce the hernia so they consulted the surgery team.  A CT was obtained and noted a small knuckle of bowel in the hernia sac as well as ascitic fluid.  Notably there is ascites on the CT scan as well.  The hernia was able to be reduced at the bedside with significant manipulation and the patient is going to try some food and be discharged from the emergency room.  Past Medical History:  Diagnosis Date  . Ascites   . Cirrhosis ([place])   . Elevated AFP   . Hepatitis C   . Inguinal hernia   . Portal hypertension ([place])   . Umbilical hernia     Past Surgical History:  Procedure Laterality Date  . IR PARACENTESIS  09/16/2019  . IR PARACENTESIS  10/09/2019  . IR PARACENTESIS  12/09/2019  . NO PAST SURGERIES      Family History  Problem Relation Age of Onset  . Stomach cancer Neg Hx   . Colon cancer Neg Hx   . Esophageal cancer Neg Hx   . Pancreatic cancer Neg Hx     Social:  reports that he has never smoked. He has never used smokeless tobacco. He reports previous alcohol use. He reports previous drug use.  Allergies: No Known Allergies  Medications: Current Outpatient Medications  Medication Instructions  . Sofosbuvir-Velpatasvir (EPCLUSA) 400-100 MG TABS 1 tablet, Oral, Daily,  Take 1 tablet by mouth daily.  Marland Kitchen spironolactone (ALDACTONE) 100 mg, Oral, Daily    ROS - all of the below systems have been reviewed with the patient and positives are indicated with bold text General: chills, fever or night sweats Eyes: blurry vision or double vision ENT: epistaxis or sore throat Allergy/Immunology: itchy/watery eyes or nasal congestion Hematologic/Lymphatic: bleeding problems, blood clots or swollen lymph nodes Endocrine: temperature intolerance or unexpected weight changes Breast: new or changing breast lumps or nipple discharge Resp: cough, shortness of breath, or wheezing CV: chest pain or dyspnea on exertion GI: as per HPI GU: dysuria, trouble voiding, or hematuria MSK: joint pain or joint stiffness Neuro: TIA or stroke symptoms Derm: pruritus and skin lesion changes Psych: anxiety and depression  Objective   PE Blood pressure (!) 110/35, pulse 80, temperature 97.8 F (36.6 C), temperature source Oral, resp. rate 16, SpO2 95 %. Constitutional: NAD; conversant; no deformities Eyes: Moist conjunctiva; no lid lag; anicteric; PERRL Neck: Trachea midline; no thyromegaly Lungs: Normal respiratory effort; no tactile fremitus CV: RRR; no palpable thrills; no pitting edema GI: Abd soft, nontender, nondistended.  Plum sized umbilical hernia firm, some tenderness to palpation but the patient tolerates manipulation of this hernia very well.  After significant manipulation massage the hernia was able to be reduced completely.  The fascial  defect feels to be about 1 cm x 1 cm.  The skin overlying the hernia is thinning some. MSK: Normal range of motion of extremities; no clubbing/cyanosis Psychiatric: Appropriate affect; alert and oriented x3 Lymphatic: No palpable cervical or axillary lymphadenopathy  Results for orders placed or performed during the hospital encounter of 02/17/20 (from the past 24 hour(s))  Basic metabolic panel     Status: Abnormal   Collection Time:  02/17/20  3:06 AM  Result Value Ref Range   Sodium 134 (L) 135 - 145 mmol/L   Potassium 4.3 3.5 - 5.1 mmol/L   Chloride 105 98 - 111 mmol/L   CO2 20 (L) 22 - 32 mmol/L   Glucose, Bld 133 (H) 70 - 99 mg/dL   BUN 20 8 - 23 mg/dL   Creatinine, Ser 0.95 0.61 - 1.24 mg/dL   Calcium 9.2 8.9 - 10.3 mg/dL   GFR, Estimated >60 >60 mL/min   Anion gap 9 5 - 15  CBC with Differential     Status: Abnormal   Collection Time: 02/17/20  3:06 AM  Result Value Ref Range   WBC 7.2 4.0 - 10.5 K/uL   RBC 3.88 (L) 4.22 - 5.81 MIL/uL   Hemoglobin 12.3 (L) 13.0 - 17.0 g/dL   HCT 36.2 (L) 39.0 - 52.0 %   MCV 93.3 80.0 - 100.0 fL   MCH 31.7 26.0 - 34.0 pg   MCHC 34.0 30.0 - 36.0 g/dL   RDW 15.7 (H) 11.5 - 15.5 %   Platelets 71 (L) 150 - 400 K/uL   nRBC 0.0 0.0 - 0.2 %   Neutrophils Relative % 82 %   Neutro Abs 5.9 1.7 - 7.7 K/uL   Lymphocytes Relative 13 %   Lymphs Abs 0.9 0.7 - 4.0 K/uL   Monocytes Relative 5 %   Monocytes Absolute 0.4 0.1 - 1.0 K/uL   Eosinophils Relative 0 %   Eosinophils Absolute 0.0 0.0 - 0.5 K/uL   Basophils Relative 0 %   Basophils Absolute 0.0 0.0 - 0.1 K/uL   Immature Granulocytes 0 %   Abs Immature Granulocytes 0.01 0.00 - 0.07 K/uL     Imaging Orders     CT ABDOMEN PELVIS W CONTRAST   Assessment and Plan   Richard Yang is an 65 y.o. male with an umbilical hernia as well as cirrhosis.  The umbilical hernia was able to be reduced in the emergency room so he will be discharged from the hospital.  The patient can follow-up with me in office and we will optimize him from a medical perspective prior to proceeding with umbilical hernia repair.  He also has an asymptomatic right sided inguinal hernia.  We will have to discuss the risk, benefits, and alternatives of proceeding with surgery as well as the timing of surgery as he has stopped drinking alcohol and is near completion of his hepatitis C treatment.  I will continue this complex discussion with the patient on an  outpatient basis.  Felicie Morn, MD  [facility] Use [email or web address] to contact on call provider

## 2020-02-17 NOTE — ED Notes (Signed)
Surgery at bedside to eval pt.

## 2020-02-17 NOTE — ED Provider Notes (Signed)
Received patient as a handoff at shift change from [facility], PA-C.  In short, patient is a 65 year old male with PMH of HTN, cirrhosis, and umbilical hernia who was pending to the ED with complaints of irreducible umbilical hernia.  Patient is passing flatus and there is low suspicion for obstruction at this time.  Patient denies any nausea or emesis.  No fevers or chills.  Reduction was attempted by handoff team without success.  Patient had an outpatient appointment scheduled with [facility] for later this month.   Handoff PA had already consulted general surgery who will come down to evaluate the patient here in the ED which is pending at time of handoff.    Physical Exam  BP (!) 110/35   Pulse 80   Temp 97.8 F (36.6 C) (Oral)   Resp 16   SpO2 95%   Physical Exam Vitals and nursing note reviewed. Exam conducted with a chaperone present.  Constitutional:      General: He is not in acute distress.    Appearance: He is not ill-appearing.  HENT:     Head: Normocephalic and atraumatic.  Eyes:     General: No scleral icterus.    Conjunctiva/sclera: Conjunctivae normal.  Cardiovascular:     Rate and Rhythm: Normal rate.     Pulses: Normal pulses.  Pulmonary:     Effort: Pulmonary effort is normal. No respiratory distress.  Abdominal:     Hernia: A hernia is present.     Comments: Large umbilical hernia in epigastrium, mild discoloration.  Firm.  NABS.  No significant tenderness elsewhere.  Genitourinary:    Comments: Mild generalized edema Musculoskeletal:     Cervical back: Normal range of motion.  Skin:    General: Skin is dry.     Capillary Refill: Capillary refill takes less than 2 seconds.  Neurological:     Mental Status: He is alert and oriented to person, place, and time.     GCS: GCS eye subscore is 4. GCS verbal subscore is 5. GCS motor subscore is 6.  Psychiatric:        Mood and Affect: Mood normal.        Behavior: Behavior normal.         Thought Content: Thought content normal.      ED Course/Procedures   Clinical Course as of 02/17/20 1004  Wed Feb 17, 2020  0446 Consulted surgery.  Coming to see patient [KW]    Clinical Course User Index [KW] Barrie Folk, PA-C    Procedures Results for orders placed or performed during the hospital encounter of 42/35/36  Basic metabolic panel  Result Value Ref Range   Sodium 134 (L) 135 - 145 mmol/L   Potassium 4.3 3.5 - 5.1 mmol/L   Chloride 105 98 - 111 mmol/L   CO2 20 (L) 22 - 32 mmol/L   Glucose, Bld 133 (H) 70 - 99 mg/dL   BUN 20 8 - 23 mg/dL   Creatinine, Ser 0.95 0.61 - 1.24 mg/dL   Calcium 9.2 8.9 - 10.3 mg/dL   GFR, Estimated >60 >60 mL/min   Anion gap 9 5 - 15  CBC with Differential  Result Value Ref Range   WBC 7.2 4.0 - 10.5 K/uL   RBC 3.88 (L) 4.22 - 5.81 MIL/uL   Hemoglobin 12.3 (L) 13.0 - 17.0 g/dL   HCT 36.2 (L) 39.0 - 52.0 %   MCV 93.3 80.0 - 100.0 fL   MCH 31.7 26.0 -  34.0 pg   MCHC 34.0 30.0 - 36.0 g/dL   RDW 15.7 (H) 11.5 - 15.5 %   Platelets 71 (L) 150 - 400 K/uL   nRBC 0.0 0.0 - 0.2 %   Neutrophils Relative % 82 %   Neutro Abs 5.9 1.7 - 7.7 K/uL   Lymphocytes Relative 13 %   Lymphs Abs 0.9 0.7 - 4.0 K/uL   Monocytes Relative 5 %   Monocytes Absolute 0.4 0.1 - 1.0 K/uL   Eosinophils Relative 0 %   Eosinophils Absolute 0.0 0.0 - 0.5 K/uL   Basophils Relative 0 %   Basophils Absolute 0.0 0.0 - 0.1 K/uL   Immature Granulocytes 0 %   Abs Immature Granulocytes 0.01 0.00 - 0.07 K/uL   CT ABDOMEN PELVIS W CONTRAST  Addendum Date: 02/17/2020   ADDENDUM REPORT: 02/17/2020 08:25 ADDENDUM: Findings conveyed toGARRETT Rauf on 02/17/2020  at08:25. Electronically Signed   By: Suzy Bouchard M.D.   On: 02/17/2020 08:25   Result Date: 02/17/2020 CLINICAL DATA:  Abdominal pain. Umbilical hernia. Cirrhosis hypertension EXAM: CT ABDOMEN AND PELVIS WITH CONTRAST TECHNIQUE: Multidetector CT imaging of the abdomen and pelvis was performed using  the standard protocol following bolus administration of intravenous contrast. CONTRAST:  171mL OMNIPAQUE IOHEXOL 300 MG/ML  SOLN COMPARISON:  MRI 12/23/2019 06/18/2018 FINDINGS: Lower chest: Lung bases are clear. Hepatobiliary: Liver is shrunken with a nodular contour. Moderate volume ascites similar prior. Portal veins are patent. Gallstones without cholecystitis. No biliary duct dilatation Pancreas: Pancreas is normal. No ductal dilatation. No pancreatic inflammation. Spleen: Normal volume spleen.  Splenic vein appears normal. Adrenals/urinary tract: Adrenal glands, kidneys, ureters and bladder normal. Stomach/Bowel: Stomach and duodenum normal. Small bowel proximally is normal. There is a midline umbilical hernia which contains a 6 to 8 cm segmentof small bowel. Small bowel is slightly dilated proximal to the hernia. There is fluid within the hernia sac. The neck of the hernia sac is narrow. Hernia sac measures 6.8 cm diameter increased from 4.5 cm on prior Vascular/Lymphatic: Abdominal aorta is normal caliber with atherosclerotic calcification. There is no retroperitoneal or periportal lymphadenopathy. No pelvic lymphadenopathy. Reproductive: Prostate enlarged 54 cm Other: Bilateral inguinal hernias. The RIGHT inguinal hernia sac contains ascitic fluid. Musculoskeletal: No aggressive osseous lesion. IMPRESSION: 1. Interval enlargement umbilical hernia which now contains short loop of small bowel and fluid with early mechanical obstruction. Potential for strangulation of the loop of small bowel. 2. Cirrhosis with ascites. Electronically Signed: By: Suzy Bouchard M.D. On: 02/17/2020 08:22     MDM   On my examination, patient is resting comfortably in Trendelenburg position.  He states that he was wearing his hernia belt yesterday on the bus when he noticed bulging.  He reports that it is not quite as painful as his first 2 episodes when the umbilical hernia was unable to be reduced, however he does state  that it feels "firmer" than the prior episodes.  He also endorses associated swelling in his lower abdomen and groin which he reports is chronic.  Patient notes a history of cirrhosis requiring paracentesis, but no significant ascites noted on my exam.  I spoke with general surgery who recommends CT abdomen and pelvis with contrast.  They will then reassess.    I spoke with radiologist, Dr. Ilda Foil, who states that there is a loop of bowel involved that is at risk for incarceration.  Very small neck noted.  No total obstruction at this time, but evidence of early mechanical obstruction.  Dr.  Stechschulte was able to reduce the hernia in the ER so he can be safely discharged home with outpatient follow-up.  I spoke with Claiborne Billings PA and he will simply require p.o. challenge prior to discharge.  If he can eat and drink, he can go home.  ED return precautions discussed.  Patient voices understanding and is agreeable to the plan.    Sy, [place], PA-C 02/17/20 1004    Lucrezia Starch, MD 02/19/20 1141

## 2020-02-17 NOTE — Discharge Instructions (Signed)
You had an incarcerated hernia in the ER, but it was fortunately able to be reduced by Dr. Thermon Leyland with [facility].  Please follow-up with them for ongoing evaluation and management.  Return to the ED or seek immediate medical attention should you experience any new or worsening symptoms.

## 2020-02-17 NOTE — ED Provider Notes (Signed)
[facility] DEPT Provider Note   CSN: [id number] Arrival date & time: 02/16/20  1556     History Chief Complaint  Patient presents with  . Hernia    Tamas Suen is a 65 y.o. male with past medical history significant for hypertension, cirrhosis of the liver, umbilical hernia.  HPI Patient presents emergency room today with chief complaint of hernia x1 day.  Patient states he was wearing a binder on his abdomen and thinks he left it on too long which caused the hernia to pop out.  He is unsure exactly what time this happened, states it was during the day prior to arrival may be around 1 PM.  He states this time the hernia seems bigger than usual.  He describes the pain as throbbing and burning.  Pain is localized to the hernia.  Pain is 8/10 in severity.  He tried to reduce the hernia himself without success.  No medications for symptoms prior to arrival.  He is also endorsing swelling in his right testicle and groin.  He states has been going on for months and is unchanged today. He admits to passing flatus while in the waiting room. Last bowel movement yesterday. He denies any associated testicular or scrotal pain.  He denies any fever, chills, nausea, emesis, urinary symptoms, diarrhea.   Patient states he has an appointment scheduled with outpatient surgery this month.  Not yet been evaluated by surgery for hernias.  Patient states he had paracentesis approximately 2 months ago.     Past Medical History:  Diagnosis Date  . Ascites   . Cirrhosis ([place])   . Elevated AFP   . Hepatitis C   . Inguinal hernia   . Portal hypertension ([place])   . Umbilical hernia     Patient Active Problem List   Diagnosis Date Noted  . Other cirrhosis of liver ([place]) 11/16/2019  . Ascites of liver 11/16/2019  . Elevated AFP 11/16/2019  . Alcohol use disorder, moderate, in early remission, dependence ([place]) 11/16/2019  . Chronic hepatitis C without hepatic coma ([place])  09/23/2019  . Portal hypertension ([place]) 08/06/2019  . Thrombocytopenia ([place]) 08/06/2019  . Umbilical hernia 70/02/7492  . Inguinal hernia 07/13/2019  . Aortic atherosclerosis ([place]) 07/13/2019  . History of alcohol use 07/13/2019  . Liver cirrhosis, alcoholic ([place]) 49/67/5916  . Hypertension 06/24/2019    Past Surgical History:  Procedure Laterality Date  . IR PARACENTESIS  09/16/2019  . IR PARACENTESIS  10/09/2019  . IR PARACENTESIS  12/09/2019  . NO PAST SURGERIES         Family History  Problem Relation Age of Onset  . Stomach cancer Neg Hx   . Colon cancer Neg Hx   . Esophageal cancer Neg Hx   . Pancreatic cancer Neg Hx     Social History   Tobacco Use  . Smoking status: Never Smoker  . Smokeless tobacco: Never Used  Vaping Use  . Vaping Use: Never used  Substance Use Topics  . Alcohol use: Not Currently    Comment: sober since May  . Drug use: Not Currently    Home Medications Prior to Admission medications   Medication Sig Start Date End Date Taking? Authorizing Provider  Sofosbuvir-Velpatasvir (EPCLUSA) 400-100 MG TABS Take 1 tablet by mouth daily. Take 1 tablet by mouth daily. 01/18/20  Yes Kuppelweiser, Cassie L, RPH-CPP  spironolactone (ALDACTONE) 100 MG tablet Take 1 tablet (100 mg total) by mouth daily. 02/11/20  Yes Elsie Stain,  MD    Allergies    Patient has no known allergies.  Review of Systems   Review of Systems All other systems are reviewed and are negative for acute change except as noted in the HPI.  Physical Exam Updated Vital Signs BP (!) 183/80 (BP Location: Left Arm)   Pulse 88   Temp 97.8 F (36.6 C) (Oral)   Resp 16   SpO2 100%   Physical Exam Vitals and nursing note reviewed.  Constitutional:      General: He is not in acute distress.    Appearance: He is not ill-appearing.  HENT:     Head: Normocephalic and atraumatic.     Right Ear: Tympanic membrane and external ear normal.     Left Ear: Tympanic membrane and  external ear normal.     Nose: Nose normal.     Mouth/Throat:     Mouth: Mucous membranes are moist.     Pharynx: Oropharynx is clear.  Eyes:     General: No scleral icterus.       Right eye: No discharge.        Left eye: No discharge.     Extraocular Movements: Extraocular movements intact.     Conjunctiva/sclera: Conjunctivae normal.     Pupils: Pupils are equal, round, and reactive to light.  Neck:     Vascular: No JVD.  Cardiovascular:     Rate and Rhythm: Normal rate and regular rhythm.     Pulses: Normal pulses.          Radial pulses are 2+ on the right side and 2+ on the left side.     Heart sounds: Normal heart sounds.  Pulmonary:     Comments: Lungs clear to auscultation in all fields. Symmetric chest rise. No wheezing, rales, or rhonchi. Abdominal:     General: Bowel sounds are normal.     Comments: Moderate size umbilical hernia present.  Mildly tender to palpation.  Abdomen is non distended. no peritoneal signs.  Genitourinary:    Comments: RN Tijen present to chaperone exam. Swelling of right groin and testicle, unchanged per patient. Non tender to palpation. Musculoskeletal:        General: Normal range of motion.     Cervical back: Normal range of motion.  Skin:    General: Skin is warm and dry.     Capillary Refill: Capillary refill takes less than 2 seconds.  Neurological:     Mental Status: He is oriented to person, place, and time.     GCS: GCS eye subscore is 4. GCS verbal subscore is 5. GCS motor subscore is 6.     Comments: Fluent speech, no facial droop.  Psychiatric:        Behavior: Behavior normal.     ED Results / Procedures / Treatments   Labs (all labs ordered are listed, but only abnormal results are displayed) Labs Reviewed  BASIC METABOLIC PANEL - Abnormal; Notable for the following components:      Result Value   Sodium 134 (*)    CO2 20 (*)    Glucose, Bld 133 (*)    All other components within normal limits  CBC WITH  DIFFERENTIAL/PLATELET - Abnormal; Notable for the following components:   RBC 3.88 (*)    Hemoglobin 12.3 (*)    HCT 36.2 (*)    RDW 15.7 (*)    Platelets 71 (*)    All other components within normal limits    EKG None  Radiology No results found.  Procedures Procedures (including critical care time)  Medications Ordered in ED Medications  HYDROmorphone (DILAUDID) injection 1 mg (1 mg Intravenous Given 02/17/20 0306)  ondansetron (ZOFRAN) injection 4 mg (4 mg Intravenous Given 02/17/20 0306)  HYDROmorphone (DILAUDID) injection 0.5 mg (0.5 mg Intravenous Given 02/17/20 9833)    ED Course  I have reviewed the triage vital signs and the nursing notes.  Pertinent labs & imaging results that were available during my care of the patient were reviewed by me and considered in my medical decision making (see chart for details).  Clinical Course as of 02/17/20 0638  Wed Feb 17, 2020  0446 Consulted surgery.  Coming to see patient [KW]    Clinical Course User Index [KW] Lewanda Rife   MDM Rules/Calculators/A&P                          History provided by patient with additional history obtained from chart review.    Patient seen by MD in triage unsuccessful reduction of hernia.  Please see MSE note.  Well-appearing 65 year old male presenting with umbilical hernia.  Nontoxic-appearing.  Afebrile, hemodynamically stable. On exam umbilical hernia present. Mildly tender to palpation. Given IV analgesia, ice pack applied to hernia and patient placed in Trendelenburg.  Basic labs checked incase patient will need surgical intervention. CBC without leukocytosis, hemoglobin 12.5 consistent with baseline, thrombocytopenia also consistent with baseline. BMP with bicarb 20, hyponatremia 134 otherwise unremarkable.  Reassessed patient and pain has improved. I as well as ED attending Dr. Christy Gentles attempted to reduce hernia without success.  Consulted surgery for concern for  incarcerated hernia. Case discussed with Dr. Thermon Leyland and he states surgery will see patient in consult.   Patient care transferred to G. Alphin PA-C at the end of my shift pending surgery consult and recommendations. Patient presentation, ED course, and plan of care discussed with review of all pertinent labs and imaging. Please see his note for further details regarding further ED course and disposition.   Portions of this note were generated with Lobbyist. Dictation errors may occur despite best attempts at proofreading.   Final Clinical Impression(s) / ED Diagnoses Final diagnoses:  Umbilical hernia without obstruction and without gangrene    Rx / DC Orders ED Discharge Orders    None       Lewanda Rife 02/17/20 8250    Ripley Fraise, MD 02/17/20 [phone]

## 2020-02-17 NOTE — ED Notes (Signed)
Pt sipping on water and eating crackers for PO challenge. Denies abd pain. Hernia remains reduced at this time.

## 2020-02-26 ENCOUNTER — Encounter: Payer: Self-pay | Admitting: *Deleted

## 2020-02-26 NOTE — Telephone Encounter (Signed)
Created in error

## 2020-03-03 ENCOUNTER — Other Ambulatory Visit: Payer: Self-pay | Admitting: Gastroenterology

## 2020-03-03 ENCOUNTER — Ambulatory Visit (AMBULATORY_SURGERY_CENTER): Payer: Self-pay | Admitting: *Deleted

## 2020-03-03 ENCOUNTER — Other Ambulatory Visit: Payer: Self-pay

## 2020-03-03 VITALS — Ht 69.5 in | Wt 196.2 lb

## 2020-03-03 DIAGNOSIS — Z1211 Encounter for screening for malignant neoplasm of colon: Secondary | ICD-10-CM

## 2020-03-03 MED ORDER — SUPREP BOWEL PREP KIT 17.5-3.13-1.6 GM/177ML PO SOLN: 1.0000 | Freq: Once | ORAL | 0 refills | Status: DC

## 2020-03-03 MED FILL — SUPREP BOWEL PREP KIT: 17.5-3.13-1 | 354 days supply | Qty: 354 | Fill #0

## 2020-03-03 NOTE — Progress Notes (Signed)
Completed covid vaccines  Pt is aware that care partner will wait in the car during procedure; if they feel like they will be too hot or cold to wait in the car; they may wait in the 4 th floor lobby. Patient is aware to bring only one care partner. We want them to wear a mask (we do not have any that we can provide them), practice social distancing, and we will check their temperatures when they get here.  I did remind the patient that their care partner needs to stay in the parking lot the entire time and have a cell phone available, we will call them when the pt is ready for discharge. Patient will wear mask into building.  No sedation in the past, denies trouble moving neck, or fam hx of malignant hyperthermia   No egg or soy allergy  No home oxygen use   No medications for weight loss taken  emmi information given  Pt denies constipation issues

## 2020-03-07 ENCOUNTER — Other Ambulatory Visit: Payer: Self-pay

## 2020-03-07 ENCOUNTER — Encounter (HOSPITAL_COMMUNITY): Payer: Self-pay

## 2020-03-07 ENCOUNTER — Emergency Department (HOSPITAL_COMMUNITY)
Admission: EM | Admit: 2020-03-07 | Discharge: 2020-03-07 | Disposition: A | Discharge status: Home / Self Care | Payer: Medicare Other | Attending: Emergency Medicine | Admitting: Emergency Medicine

## 2020-03-07 DIAGNOSIS — I10 Essential (primary) hypertension: Secondary | ICD-10-CM | POA: Diagnosis not present

## 2020-03-07 DIAGNOSIS — K420 Umbilical hernia with obstruction, without gangrene: Secondary | ICD-10-CM | POA: Insufficient documentation

## 2020-03-07 DIAGNOSIS — Z79899 Other long term (current) drug therapy: Secondary | ICD-10-CM | POA: Diagnosis not present

## 2020-03-07 DIAGNOSIS — R109 Unspecified abdominal pain: Secondary | ICD-10-CM | POA: Diagnosis present

## 2020-03-07 NOTE — Discharge Instructions (Addendum)
You been seen here for a hernia.  I was able to reduce the hernia.  Please continue wearing your hernia belt as prescribed by your doctor.  I would like you to follow-up with your colonoscopy and your general surgeon.  Please come back to emergency department if you develop fevers, chills, nausea, vomiting, severe abdominal pain, unable to pass stools or pass gas.

## 2020-03-07 NOTE — ED Provider Notes (Signed)
[facility] DEPT Provider Note   CSN: [id number] Arrival date & time: 03/07/20  1302     History Chief Complaint  Patient presents with  . Hernia    Darol Cush is a 65 y.o. male.  HPI   Patient with significant medical history of cirrhosis, ascites, inguinal hernia, umbilical hernia presents with chief complaint of umbilical hernia.  Patient endorses today he noticed that his umbilical hernia was protruding more than usual, he has slight pain but states is not as painful as it normally is.  He states he is tolerating p.o., denies nausea, vomiting, diarrhea, is having normal bowel movements, is passing flatus.   Patient endorses that he was here on 01/12 for same complaint, he had CT abdomen pelvis done showed that he has a small piece of bowel protruding through his umbilicus.  Dr. Thermon Leyland of general surgery was able to reduce the hernia.  He was discharged home.  Patient supposed to have a colonoscopy at the end of this month and then go for hernia reduction.  Patient denies alleviating factors.  Patient denies headaches, fevers, chills, shortness of breath, chest pain, abdominal pain, nausea, vomiting, diarrhea, pedal edema.  Past Medical History:  Diagnosis Date  . Ascites   . Cirrhosis ([place])   . Elevated AFP   . Hepatitis C   . Inguinal hernia   . Portal hypertension ([place])   . Substance abuse ([place])    alcohol abuse hx  . Umbilical hernia     Patient Active Problem List   Diagnosis Date Noted  . Other cirrhosis of liver ([place]) 11/16/2019  . Ascites of liver 11/16/2019  . Elevated AFP 11/16/2019  . Alcohol use disorder, moderate, in early remission, dependence ([place]) 11/16/2019  . Chronic hepatitis C without hepatic coma ([place]) 09/23/2019  . Portal hypertension ([place]) 08/06/2019  . Thrombocytopenia ([place]) 08/06/2019  . Umbilical hernia 97/35/3299  . Inguinal hernia 07/13/2019  . Aortic atherosclerosis ([place]) 07/13/2019  . History of alcohol  use 07/13/2019  . Liver cirrhosis, alcoholic ([place]) 24/26/8341  . Hypertension 06/24/2019    Past Surgical History:  Procedure Laterality Date  . IR PARACENTESIS  09/16/2019  . IR PARACENTESIS  10/09/2019  . IR PARACENTESIS  12/09/2019  . NO PAST SURGERIES         Family History  Problem Relation Age of Onset  . Stomach cancer Neg Hx   . Colon cancer Neg Hx   . Esophageal cancer Neg Hx   . Pancreatic cancer Neg Hx     Social History   Tobacco Use  . Smoking status: Never Smoker  . Smokeless tobacco: Never Used  Vaping Use  . Vaping Use: Never used  Substance Use Topics  . Alcohol use: Not Currently    Comment: sober since May  . Drug use: Not Currently    Home Medications Prior to Admission medications   Medication Sig Start Date End Date Taking? Authorizing Provider  Sofosbuvir-Velpatasvir (EPCLUSA) 400-100 MG TABS Take 1 tablet by mouth daily. Take 1 tablet by mouth daily. 01/18/20   Kuppelweiser, Cassie L, RPH-CPP  spironolactone (ALDACTONE) 100 MG tablet Take 1 tablet (100 mg total) by mouth daily. 02/11/20   Elsie Stain, MD    Allergies    Patient has no known allergies.  Review of Systems   Review of Systems  Constitutional: Negative for chills and fever.  HENT: Negative for congestion.   Respiratory: Negative for shortness of breath.   Cardiovascular: Negative for chest  pain.  Gastrointestinal: Positive for abdominal pain. Negative for constipation, diarrhea, nausea, rectal pain and vomiting.       Umbilical hernia  Genitourinary: Negative for enuresis.  Musculoskeletal: Negative for back pain.  Skin: Negative for rash.  Neurological: Negative for headaches.  Hematological: Does not bruise/bleed easily.    Physical Exam Updated Vital Signs BP (!) 141/70   Pulse 75   Temp 98.5 F (36.9 C) (Oral)   Resp 18   SpO2 99%   Physical Exam Vitals and nursing note reviewed.  Constitutional:      General: He is not in acute distress.    Appearance:  He is not ill-appearing.  HENT:     Head: Normocephalic and atraumatic.     Nose: No congestion.  Eyes:     Conjunctiva/sclera: Conjunctivae normal.  Cardiovascular:     Rate and Rhythm: Normal rate and regular rhythm.  Pulmonary:     Effort: Pulmonary effort is normal.  Abdominal:     General: There is no distension.     Palpations: Abdomen is soft.     Tenderness: There is no right CVA tenderness or left CVA tenderness.     Comments: Patient's abdomen is visualized, he has a umbilical hernia, it was firm, protruding, measuring approximately 3 cm in diameter.  There is no noted skin changes, it was not warm to the touch.  abdomen was soft nontender to palpation, negative Murphy sign, negative McBurney point, negative peritoneal.  Musculoskeletal:     Right lower leg: No edema.     Left lower leg: No edema.     Comments: Patient can move all 4 extremities without difficulty.  Skin:    General: Skin is warm and dry.  Neurological:     Mental Status: He is alert.  Psychiatric:        Mood and Affect: Mood normal.     ED Results / Procedures / Treatments   Labs (all labs ordered are listed, but only abnormal results are displayed) Labs Reviewed - No data to display  EKG None  Radiology No results found.  Procedures Hernia reduction  Date/Time: 03/07/2020 3:23 PM Performed by: Marcello Fennel, PA-C Authorized by: Marcello Fennel, PA-C  Consent: Verbal consent obtained. Consent given by: patient Patient identity confirmed: verbally with patient Local anesthesia used: no  Anesthesia: Local anesthesia used: no  Sedation: Patient sedated: no  Patient tolerance: patient tolerated the procedure well with no immediate complications      Medications Ordered in ED Medications - No data to display  ED Course  I have reviewed the triage vital signs and the nursing notes.  Pertinent labs & imaging results that were available during my care of the patient were  reviewed by me and considered in my medical decision making (see chart for details).    MDM Rules/Calculators/A&P                          Initial impression-patient presents with complaint of umbilical hernia the need to be reduced.  He is alert, does not appear in acute distress, vital signs reassuring.  Concern for incarcerated, strangulated hernia, bowel obstruction, perforation.    Will recommend to reduce hernia at this time as this will help relieve pain and help treat the patient.  Patient was admittable to this, herniation reduction was successful.  Patient tolerated procedure well has no complaints at this time.  Work-up-due to well-appearing patient, benign physical exam  further lab imaging not warranted at this time.  Rule out-low suspicion for strangulated or incarcerated hernia as hernia was successfully reduced.  Low suspicion for abdominal perforation as abdomen was soft nontender to palpation, no peritoneal sign noted.  Low suspicion for bowel obstruction as patient had normoactive bowel sounds, abdomen was nondistended, dull to percussion, he has had normal bowel movements is passing flatulence without difficulty.  Plan-suspect patient has a umbilical hernia, which was reducible.  Will recommend he continues wearing his hernia belt, follow-up with his general surgeon for further evaluation.  Vital signs have remained stable, no indication for hospital admission.  Patient discussed with attending and they agreed with assessment and plan.  Patient given at home care as well strict return precautions.  Patient verbalized that they understood agreed to said plan.   Final Clinical Impression(s) / ED Diagnoses Final diagnoses:  Umbilical hernia with obstruction, without gangrene    Rx / DC Orders ED Discharge Orders    None       Marcello Fennel, PA-C 03/07/20 1528    Dorie Rank, MD 03/08/20 [phone]

## 2020-03-07 NOTE — ED Triage Notes (Signed)
Pt arrived c/o umbilical hernia, arrived for it to be reduced hx of same.

## 2020-03-07 NOTE — ED Notes (Signed)
An After Visit Summary was printed and given to the patient. Discharge instructions given and no further questions at this time.  

## 2020-03-08 ENCOUNTER — Other Ambulatory Visit: Payer: Self-pay

## 2020-03-08 ENCOUNTER — Ambulatory Visit: Payer: Medicare Other

## 2020-03-11 MED FILL — SPIRONOLACTONE 100 MG TAB: 100 | 30 days supply | Qty: 30 | Fill #1

## 2020-03-24 ENCOUNTER — Encounter: Payer: Medicare Other | Admitting: Gastroenterology

## 2020-03-28 ENCOUNTER — Encounter (HOSPITAL_COMMUNITY): Payer: Self-pay | Admitting: Emergency Medicine

## 2020-03-28 ENCOUNTER — Other Ambulatory Visit: Payer: Self-pay

## 2020-03-28 DIAGNOSIS — Z20822 Contact with and (suspected) exposure to covid-19: Secondary | ICD-10-CM | POA: Diagnosis not present

## 2020-03-28 DIAGNOSIS — K4010 Bilateral inguinal hernia, with gangrene, not specified as recurrent: Secondary | ICD-10-CM | POA: Diagnosis present

## 2020-03-28 DIAGNOSIS — B1920 Unspecified viral hepatitis C without hepatic coma: Secondary | ICD-10-CM | POA: Diagnosis present

## 2020-03-28 DIAGNOSIS — D6959 Other secondary thrombocytopenia: Secondary | ICD-10-CM | POA: Diagnosis not present

## 2020-03-28 DIAGNOSIS — K55029 Acute infarction of small intestine, extent unspecified: Secondary | ICD-10-CM | POA: Diagnosis present

## 2020-03-28 DIAGNOSIS — K7031 Alcoholic cirrhosis of liver with ascites: Secondary | ICD-10-CM | POA: Diagnosis present

## 2020-03-28 DIAGNOSIS — K420 Umbilical hernia with obstruction, without gangrene: Secondary | ICD-10-CM | POA: Diagnosis present

## 2020-03-28 DIAGNOSIS — K766 Portal hypertension: Secondary | ICD-10-CM | POA: Diagnosis not present

## 2020-03-28 DIAGNOSIS — K421 Umbilical hernia with gangrene: Principal | ICD-10-CM | POA: Diagnosis present

## 2020-03-28 DIAGNOSIS — Z79899 Other long term (current) drug therapy: Secondary | ICD-10-CM | POA: Diagnosis not present

## 2020-03-28 DIAGNOSIS — I10 Essential (primary) hypertension: Secondary | ICD-10-CM | POA: Diagnosis not present

## 2020-03-28 NOTE — ED Triage Notes (Signed)
Patient presents with an umbilical hernia. He has been seen for this in the past. He is requesting reduction.

## 2020-03-29 ENCOUNTER — Inpatient Hospital Stay (HOSPITAL_COMMUNITY)
Admission: EM | Admit: 2020-03-29 | Discharge: 2020-04-02 | DRG: 329 | Disposition: A | Discharge status: Home / Self Care | Payer: Medicare Other | Attending: Surgery | Admitting: Surgery

## 2020-03-29 ENCOUNTER — Observation Stay (HOSPITAL_COMMUNITY): Payer: Medicare Other | Admitting: Anesthesiology

## 2020-03-29 ENCOUNTER — Encounter (HOSPITAL_COMMUNITY): Payer: Self-pay | Admitting: Radiology

## 2020-03-29 ENCOUNTER — Emergency Department (HOSPITAL_COMMUNITY): Payer: Medicare Other

## 2020-03-29 ENCOUNTER — Encounter (HOSPITAL_COMMUNITY)
Admission: EM | Disposition: A | Discharge status: Home / Self Care | Payer: Self-pay | Source: Home / Self Care | Attending: Surgery

## 2020-03-29 DIAGNOSIS — K420 Umbilical hernia with obstruction, without gangrene: Secondary | ICD-10-CM

## 2020-03-29 DIAGNOSIS — K55029 Acute infarction of small intestine, extent unspecified: Secondary | ICD-10-CM | POA: Diagnosis not present

## 2020-03-29 DIAGNOSIS — Z20822 Contact with and (suspected) exposure to covid-19: Secondary | ICD-10-CM | POA: Diagnosis not present

## 2020-03-29 DIAGNOSIS — K4010 Bilateral inguinal hernia, with gangrene, not specified as recurrent: Secondary | ICD-10-CM | POA: Diagnosis not present

## 2020-03-29 DIAGNOSIS — K421 Umbilical hernia with gangrene: Secondary | ICD-10-CM | POA: Diagnosis not present

## 2020-03-29 HISTORY — DX: Umbilical hernia with obstruction, without gangrene: K42.0

## 2020-03-29 HISTORY — PX: INGUINAL HERNIA REPAIR: SHX194

## 2020-03-29 LAB — CBC WITH DIFFERENTIAL/PLATELET
Abs Immature Granulocytes: 0.02 10*3/uL (ref 0.00–0.07)
Basophils Absolute: 0 10*3/uL (ref 0.0–0.1)
Basophils Relative: 0 %
Eosinophils Absolute: 0 10*3/uL (ref 0.0–0.5)
Eosinophils Relative: 0 %
HCT: 39.5 % (ref 39.0–52.0)
Hemoglobin: 13.4 g/dL (ref 13.0–17.0)
Immature Granulocytes: 0 %
Lymphocytes Relative: 17 %
Lymphs Abs: 1.1 10*3/uL (ref 0.7–4.0)
MCH: 31.2 pg (ref 26.0–34.0)
MCHC: 33.9 g/dL (ref 30.0–36.0)
MCV: 91.9 fL (ref 80.0–100.0)
Monocytes Absolute: 0.2 10*3/uL (ref 0.1–1.0)
Monocytes Relative: 4 %
Neutro Abs: 5.2 10*3/uL (ref 1.7–7.7)
Neutrophils Relative %: 79 %
Platelets: 81 10*3/uL — ABNORMAL LOW (ref 150–400)
RBC: 4.3 MIL/uL (ref 4.22–5.81)
RDW: 14.8 % (ref 11.5–15.5)
WBC: 6.5 10*3/uL (ref 4.0–10.5)
nRBC: 0 % (ref 0.0–0.2)

## 2020-03-29 LAB — RESP PANEL BY RT-PCR (FLU A&B, COVID) ARPGX2
Influenza A by PCR: NEGATIVE
Influenza B by PCR: NEGATIVE
SARS Coronavirus 2 by RT PCR: NEGATIVE

## 2020-03-29 LAB — COMPREHENSIVE METABOLIC PANEL
ALT: 25 U/L (ref 0–44)
AST: 42 U/L — ABNORMAL HIGH (ref 15–41)
Albumin: 3.7 g/dL (ref 3.5–5.0)
Alkaline Phosphatase: 79 U/L (ref 38–126)
Anion gap: 9 (ref 5–15)
BUN: 19 mg/dL (ref 8–23)
CO2: 19 mmol/L — ABNORMAL LOW (ref 22–32)
Calcium: 9.3 mg/dL (ref 8.9–10.3)
Chloride: 104 mmol/L (ref 98–111)
Creatinine, Ser: 0.77 mg/dL (ref 0.61–1.24)
GFR, Estimated: >60 mL/min (ref >60)
Glucose, Bld: 131 mg/dL — ABNORMAL HIGH (ref 70–99)
Potassium: 4 mmol/L (ref 3.5–5.1)
Sodium: 132 mmol/L — ABNORMAL LOW (ref 135–145)
Total Bilirubin: 1.2 mg/dL (ref 0.3–1.2)
Total Protein: 8 g/dL (ref 6.5–8.1)

## 2020-03-29 LAB — TYPE AND SCREEN
ABO/RH(D): A POS
Antibody Screen: NEGATIVE

## 2020-03-29 LAB — PROTIME-INR
INR: 1.1 (ref 0.8–1.2)
Prothrombin Time: 14.2 seconds (ref 11.4–15.2)

## 2020-03-29 LAB — ABO/RH: ABO/RH(D): A POS

## 2020-03-29 SURGERY — REPAIR, HERNIA, INGUINAL, ADULT
Anesthesia: General

## 2020-03-29 MED ORDER — DIPHENHYDRAMINE HCL 12.5 MG/5ML PO ELIX: 12.5000 mg | ORAL_SOLUTION | Freq: Four times a day (QID) | ORAL | Status: DC | PRN

## 2020-03-29 MED ORDER — AMISULPRIDE (ANTIEMETIC) 5 MG/2ML IV SOLN: 10.0000 mg | Freq: Once | INTRAVENOUS | Status: DC | PRN

## 2020-03-29 MED ORDER — ESMOLOL HCL 100 MG/10ML IV SOLN
INTRAVENOUS | Status: DC | PRN
  Administered 2020-03-29 (×2): 20 mg via INTRAVENOUS

## 2020-03-29 MED ORDER — IOHEXOL 300 MG/ML  SOLN
100.0000 mL | Freq: Once | INTRAMUSCULAR | Status: AC | PRN
  Administered 2020-03-29: 100 mL via INTRAVENOUS

## 2020-03-29 MED ORDER — CELECOXIB 200 MG PO CAPS
ORAL_CAPSULE | ORAL | Status: AC
  Filled 2020-03-29: qty 1

## 2020-03-29 MED ORDER — SODIUM CHLORIDE 0.9 % IV BOLUS
1000.0000 mL | Freq: Once | INTRAVENOUS | Status: AC
  Administered 2020-03-29: 1000 mL via INTRAVENOUS

## 2020-03-29 MED ORDER — LACTATED RINGERS IV SOLN: INTRAVENOUS | Status: DC

## 2020-03-29 MED ORDER — FENTANYL CITRATE (PF) 100 MCG/2ML IJ SOLN
INTRAMUSCULAR | Status: AC
  Filled 2020-03-29: qty 2

## 2020-03-29 MED ORDER — SODIUM CHLORIDE 0.9 % IV SOLN: INTRAVENOUS | Status: DC

## 2020-03-29 MED ORDER — DIPHENHYDRAMINE HCL 50 MG/ML IJ SOLN: 12.5000 mg | Freq: Four times a day (QID) | INTRAMUSCULAR | Status: DC | PRN

## 2020-03-29 MED ORDER — HYDROMORPHONE HCL 2 MG/ML IJ SOLN
INTRAMUSCULAR | Status: AC
  Filled 2020-03-29: qty 1

## 2020-03-29 MED ORDER — HYDROMORPHONE HCL 1 MG/ML IJ SOLN
INTRAMUSCULAR | Status: DC | PRN
  Administered 2020-03-29 (×4): .5 mg via INTRAVENOUS

## 2020-03-29 MED ORDER — CELECOXIB 200 MG PO CAPS
200.0000 mg | ORAL_CAPSULE | Freq: Once | ORAL | Status: AC
  Administered 2020-03-29: 200 mg via ORAL

## 2020-03-29 MED ORDER — METOPROLOL TARTRATE 5 MG/5ML IV SOLN
INTRAVENOUS | Status: DC | PRN
  Administered 2020-03-29 (×2): 1 mg via INTRAVENOUS

## 2020-03-29 MED ORDER — SUCCINYLCHOLINE CHLORIDE 200 MG/10ML IV SOSY
PREFILLED_SYRINGE | INTRAVENOUS | Status: DC | PRN
  Administered 2020-03-29: 130 mg via INTRAVENOUS

## 2020-03-29 MED ORDER — MIDAZOLAM HCL 2 MG/2ML IJ SOLN
INTRAMUSCULAR | Status: AC
  Filled 2020-03-29: qty 2

## 2020-03-29 MED ORDER — ONDANSETRON 4 MG PO TBDP: 4.0000 mg | ORAL_TABLET | Freq: Four times a day (QID) | ORAL | Status: DC | PRN

## 2020-03-29 MED ORDER — FENTANYL CITRATE (PF) 100 MCG/2ML IJ SOLN
25.0000 ug | INTRAMUSCULAR | Status: DC | PRN
Start: 2020-03-29 — End: 2020-03-29

## 2020-03-29 MED ORDER — SPIRONOLACTONE 100 MG PO TABS
100.0000 mg | ORAL_TABLET | Freq: Every day | ORAL | Status: DC
  Administered 2020-03-30 – 2020-04-02 (×4): 100 mg via ORAL
  Filled 2020-03-29 (×4): qty 1

## 2020-03-29 MED ORDER — METHOCARBAMOL 1000 MG/10ML IJ SOLN
500.0000 mg | Freq: Four times a day (QID) | INTRAVENOUS | Status: DC | PRN
  Filled 2020-03-29: qty 5

## 2020-03-29 MED ORDER — ROCURONIUM BROMIDE 10 MG/ML (PF) SYRINGE
PREFILLED_SYRINGE | INTRAVENOUS | Status: DC | PRN
  Administered 2020-03-29: 70 mg via INTRAVENOUS

## 2020-03-29 MED ORDER — PHENYLEPHRINE 40 MCG/ML (10ML) SYRINGE FOR IV PUSH (FOR BLOOD PRESSURE SUPPORT)
PREFILLED_SYRINGE | INTRAVENOUS | Status: AC
  Filled 2020-03-29: qty 10

## 2020-03-29 MED ORDER — LIDOCAINE HCL (PF) 2 % IJ SOLN
INTRAMUSCULAR | Status: AC
  Filled 2020-03-29: qty 5

## 2020-03-29 MED ORDER — LORAZEPAM 2 MG/ML IJ SOLN
1.0000 mg | Freq: Once | INTRAMUSCULAR | Status: AC
  Administered 2020-03-29: 1 mg via INTRAVENOUS
  Filled 2020-03-29: qty 1

## 2020-03-29 MED ORDER — SIMETHICONE 80 MG PO CHEW
40.0000 mg | CHEWABLE_TABLET | Freq: Four times a day (QID) | ORAL | Status: DC | PRN
Start: 2020-03-29 — End: 2020-04-02

## 2020-03-29 MED ORDER — PROPOFOL 10 MG/ML IV BOLUS
INTRAVENOUS | Status: DC | PRN
  Administered 2020-03-29: 150 mg via INTRAVENOUS

## 2020-03-29 MED ORDER — CHLORHEXIDINE GLUCONATE CLOTH 2 % EX PADS
6.0000 | MEDICATED_PAD | Freq: Once | CUTANEOUS | Status: AC
  Administered 2020-03-29: 6 via TOPICAL

## 2020-03-29 MED ORDER — LIDOCAINE 2% (20 MG/ML) 5 ML SYRINGE
INTRAMUSCULAR | Status: DC | PRN
  Administered 2020-03-29: 100 mg via INTRAVENOUS

## 2020-03-29 MED ORDER — ONDANSETRON HCL 4 MG/2ML IJ SOLN: 4.0000 mg | Freq: Four times a day (QID) | INTRAMUSCULAR | Status: DC | PRN

## 2020-03-29 MED ORDER — PHENYLEPHRINE 40 MCG/ML (10ML) SYRINGE FOR IV PUSH (FOR BLOOD PRESSURE SUPPORT)
PREFILLED_SYRINGE | INTRAVENOUS | Status: DC | PRN
  Administered 2020-03-29 (×2): 120 ug via INTRAVENOUS

## 2020-03-29 MED ORDER — ONDANSETRON HCL 4 MG/2ML IJ SOLN
INTRAMUSCULAR | Status: AC
  Filled 2020-03-29: qty 2

## 2020-03-29 MED ORDER — ACETAMINOPHEN 650 MG RE SUPP: 650.0000 mg | Freq: Four times a day (QID) | RECTAL | Status: DC | PRN

## 2020-03-29 MED ORDER — ACETAMINOPHEN 325 MG PO TABS: 650.0000 mg | ORAL_TABLET | Freq: Four times a day (QID) | ORAL | Status: DC | PRN

## 2020-03-29 MED ORDER — SUCCINYLCHOLINE CHLORIDE 200 MG/10ML IV SOSY
PREFILLED_SYRINGE | INTRAVENOUS | Status: AC
  Filled 2020-03-29: qty 10

## 2020-03-29 MED ORDER — METOPROLOL TARTRATE 5 MG/5ML IV SOLN
5.0000 mg | Freq: Four times a day (QID) | INTRAVENOUS | Status: DC | PRN
Start: 2020-03-29 — End: 2020-04-02
  Filled 2020-03-29: qty 5

## 2020-03-29 MED ORDER — CHLORHEXIDINE GLUCONATE 0.12 % MT SOLN
15.0000 mL | Freq: Once | OROMUCOSAL | Status: AC
  Administered 2020-03-29: 15 mL via OROMUCOSAL

## 2020-03-29 MED ORDER — FENTANYL CITRATE (PF) 250 MCG/5ML IJ SOLN
INTRAMUSCULAR | Status: DC | PRN
  Administered 2020-03-29: 100 ug via INTRAVENOUS

## 2020-03-29 MED ORDER — OXYCODONE HCL 5 MG PO TABS
5.0000 mg | ORAL_TABLET | ORAL | Status: DC | PRN
  Administered 2020-03-30: 19:00:00 10 mg via ORAL
  Administered 2020-03-30: 5 mg via ORAL
  Administered 2020-03-31 – 2020-04-01 (×2): 10 mg via ORAL
  Filled 2020-03-29 (×2): qty 2
  Filled 2020-03-29: qty 1
  Filled 2020-03-29: qty 2

## 2020-03-29 MED ORDER — POLYETHYLENE GLYCOL 3350 17 G PO PACK: 17.0000 g | PACK | Freq: Every day | ORAL | Status: DC | PRN

## 2020-03-29 MED ORDER — MIDAZOLAM HCL 5 MG/5ML IJ SOLN
INTRAMUSCULAR | Status: DC | PRN
  Administered 2020-03-29: 2 mg via INTRAVENOUS

## 2020-03-29 MED ORDER — MORPHINE SULFATE (PF) 2 MG/ML IV SOLN: 2.0000 mg | INTRAVENOUS | Status: DC | PRN

## 2020-03-29 MED ORDER — 0.9 % SODIUM CHLORIDE (POUR BTL) OPTIME
TOPICAL | Status: DC | PRN
Start: 2020-03-29 — End: 2020-03-29
  Administered 2020-03-29: 1000 mL

## 2020-03-29 MED ORDER — DEXAMETHASONE SODIUM PHOSPHATE 10 MG/ML IJ SOLN
INTRAMUSCULAR | Status: AC
  Filled 2020-03-29: qty 1

## 2020-03-29 MED ORDER — MORPHINE SULFATE (PF) 4 MG/ML IV SOLN
4.0000 mg | Freq: Once | INTRAVENOUS | Status: AC
  Administered 2020-03-29: 4 mg via INTRAVENOUS
  Filled 2020-03-29: qty 1

## 2020-03-29 MED ORDER — BUPIVACAINE-EPINEPHRINE 0.25% -1:200000 IJ SOLN
INTRAMUSCULAR | Status: DC | PRN
  Administered 2020-03-29: 50 mL
  Administered 2020-03-29: 20 mL

## 2020-03-29 MED ORDER — BUPIVACAINE-EPINEPHRINE 0.25% -1:200000 IJ SOLN
INTRAMUSCULAR | Status: AC
  Filled 2020-03-29: qty 1

## 2020-03-29 MED ORDER — PROPOFOL 10 MG/ML IV BOLUS
INTRAVENOUS | Status: AC
  Filled 2020-03-29: qty 20

## 2020-03-29 MED ORDER — SODIUM CHLORIDE 0.9 % IV SOLN
2.0000 g | INTRAVENOUS | Status: AC
  Administered 2020-03-29: 2 g via INTRAVENOUS
  Filled 2020-03-29: qty 2

## 2020-03-29 MED ORDER — ONDANSETRON HCL 4 MG/2ML IJ SOLN
4.0000 mg | Freq: Once | INTRAMUSCULAR | Status: AC
  Administered 2020-03-29: 4 mg via INTRAVENOUS
  Filled 2020-03-29: qty 2

## 2020-03-29 MED ORDER — SUGAMMADEX SODIUM 200 MG/2ML IV SOLN
INTRAVENOUS | Status: DC | PRN
  Administered 2020-03-29: 200 mg via INTRAVENOUS

## 2020-03-29 MED ORDER — ONDANSETRON HCL 4 MG/2ML IJ SOLN
INTRAMUSCULAR | Status: DC | PRN
  Administered 2020-03-29: 4 mg via INTRAVENOUS

## 2020-03-29 MED ORDER — ALBUMIN HUMAN 5 % IV SOLN: INTRAVENOUS | Status: DC | PRN

## 2020-03-29 SURGICAL SUPPLY — 51 items
BENZOIN TINCTURE PRP APPL 2/3 (GAUZE/BANDAGES/DRESSINGS) IMPLANT
BLADE SURG 15 STRL LF DISP TIS (BLADE) ×1 IMPLANT
BLADE SURG 15 STRL SS (BLADE) ×1
CELLS DAT CNTRL 66122 CELL SVR (MISCELLANEOUS) IMPLANT
CHLORAPREP W/TINT 26 (MISCELLANEOUS) ×2 IMPLANT
COVER SURGICAL LIGHT HANDLE (MISCELLANEOUS) ×2 IMPLANT
COVER WAND RF STERILE (DRAPES) ×2 IMPLANT
DECANTER SPIKE VIAL GLASS SM (MISCELLANEOUS) ×2 IMPLANT
DERMABOND ADVANCED (GAUZE/BANDAGES/DRESSINGS) ×1
DERMABOND ADVANCED .7 DNX12 (GAUZE/BANDAGES/DRESSINGS) ×1 IMPLANT
DRAIN PENROSE 0.5X18 (DRAIN) IMPLANT
DRAPE LAPAROSCOPIC ABDOMINAL (DRAPES) ×2 IMPLANT
DRAPE LAPAROTOMY TRNSV 102X78 (DRAPES) IMPLANT
DRAPE UTILITY 15X26 TOWEL STRL (DRAPES) IMPLANT
DRSG TEGADERM 4X4.75 (GAUZE/BANDAGES/DRESSINGS) IMPLANT
DRSG TELFA PLUS 4X6 ADH ISLAND (GAUZE/BANDAGES/DRESSINGS) IMPLANT
ELECT REM PT RETURN 15FT ADLT (MISCELLANEOUS) ×2 IMPLANT
GAUZE SPONGE 4X4 12PLY STRL (GAUZE/BANDAGES/DRESSINGS) IMPLANT
GLOVE SURG POLYISO LF SZ7 (GLOVE) ×2 IMPLANT
GLOVE SURG UNDER POLY LF SZ7 (GLOVE) IMPLANT
GOWN STRL REUS W/TWL LRG LVL3 (GOWN DISPOSABLE) ×2 IMPLANT
GOWN STRL REUS W/TWL XL LVL3 (GOWN DISPOSABLE) ×2 IMPLANT
HANDLE SUCTION POOLE (INSTRUMENTS) ×1 IMPLANT
KIT BASIN OR (CUSTOM PROCEDURE TRAY) ×2 IMPLANT
KIT TURNOVER KIT A (KITS) ×2 IMPLANT
NEEDLE HYPO 22GX1.5 SAFETY (NEEDLE) ×2 IMPLANT
PACK BASIC VI WITH GOWN DISP (CUSTOM PROCEDURE TRAY) IMPLANT
PACK GENERAL/GYN (CUSTOM PROCEDURE TRAY) ×2 IMPLANT
PENCIL SMOKE EVACUATOR (MISCELLANEOUS) IMPLANT
RELOAD PROXIMATE 75MM BLUE (ENDOMECHANICALS) ×4 IMPLANT
RETRACTOR WND ALEXIS 25 LRG (MISCELLANEOUS) IMPLANT
RTRCTR WOUND ALEXIS 18CM MED (MISCELLANEOUS)
RTRCTR WOUND ALEXIS 25CM LRG (MISCELLANEOUS)
SPONGE LAP 18X18 RF (DISPOSABLE) IMPLANT
SPONGE LAP 4X18 RFD (DISPOSABLE) ×2 IMPLANT
STAPLER PROXIMATE 75MM BLUE (STAPLE) ×2 IMPLANT
STRIP CLOSURE SKIN 1/2X4 (GAUZE/BANDAGES/DRESSINGS) IMPLANT
SUCTION POOLE HANDLE (INSTRUMENTS) ×2
SUT MNCRL AB 4-0 PS2 18 (SUTURE) ×2 IMPLANT
SUT PDS AB 2-0 CT2 27 (SUTURE) ×4 IMPLANT
SUT PROLENE 2 0 CT2 30 (SUTURE) IMPLANT
SUT VIC AB 2-0 CT1 27 (SUTURE)
SUT VIC AB 2-0 CT1 TAPERPNT 27 (SUTURE) IMPLANT
SUT VIC AB 3-0 SH 18 (SUTURE) ×8 IMPLANT
SUT VIC AB 3-0 SH 27 (SUTURE)
SUT VIC AB 3-0 SH 27XBRD (SUTURE) IMPLANT
SYR BULB IRRIG 60ML STRL (SYRINGE) IMPLANT
SYR CONTROL 10ML LL (SYRINGE) ×2 IMPLANT
TOWEL OR 17X26 10 PK STRL BLUE (TOWEL DISPOSABLE) ×2 IMPLANT
TOWEL OR NON WOVEN STRL DISP B (DISPOSABLE) ×2 IMPLANT
YANKAUER SUCT BULB TIP 10FT TU (MISCELLANEOUS) IMPLANT

## 2020-03-29 NOTE — H&P (Signed)
[facility] Admission Note  Richard Yang [birth year]  [id number].    Requesting MD: Isla Pence Chief Complaint/Reason for Consult: umbilical hernia  HPI:  Richard Yang is a 32LK male PMH alcoholic cirrhosis, HTN and hepatitis C (completed treatment about 1 week ago) who presented to [facility] earlier today with painful umbilical hernia and unable to self reduce. He has seen Dr. Thermon Leyland in our office with plans for surgical fixation after colonoscopy. Colonoscopy is scheduled for 04/12/20 with Dr. Rush Landmark. States that the hernia has been out now for about 2 days after tripping over a railroad track. He reports worsening pain/swelling at this area but denies n/v. Passing flatus and last BM was yesterday. He has had nothing to eat/drink today but was tolerating PO yesterday. EDP unable to reduce, general surgery asked to see. His WBC is WNL, VSS. LFTs down from previously with AST 42, ALT 25, Alk phos 79, Tbili 1.2. Platelets 81.  Abdominal surgical history: none Anticoagulants: none Nonsmoker Prior h/o heavy alcohol use, has not drank alcohol since 06/6254 Denies illicit drug use Not currently employed, lives at home alone NKDA  Review of Systems  Constitutional: Negative.   Respiratory: Negative.   Cardiovascular: Negative.   Gastrointestinal: Positive for abdominal pain. Negative for blood in stool, constipation, diarrhea, nausea and vomiting.  Genitourinary: Negative.   Musculoskeletal: Negative.    All systems reviewed and otherwise negative except for as above  Family History  Problem Relation Age of Onset  . Stomach cancer Neg Hx   . Colon cancer Neg Hx   . Esophageal cancer Neg Hx   . Pancreatic cancer Neg Hx     Past Medical History:  Diagnosis Date  . Ascites   . Cirrhosis ([place])   . Elevated AFP   . Hepatitis C   . Inguinal hernia   . Portal hypertension ([place])   . Substance abuse ([place])    alcohol abuse hx  . Umbilical hernia     Past Surgical  History:  Procedure Laterality Date  . IR PARACENTESIS  09/16/2019  . IR PARACENTESIS  10/09/2019  . IR PARACENTESIS  12/09/2019  . NO PAST SURGERIES      Social History:  reports that he has never smoked. He has never used smokeless tobacco. He reports previous alcohol use. He reports previous drug use.  Allergies: No Known Allergies  (Not in a hospital admission)   Prior to Admission medications   Medication Sig Start Date End Date Taking? Authorizing Provider  Sofosbuvir-Velpatasvir (EPCLUSA) 400-100 MG TABS Take 1 tablet by mouth daily. Take 1 tablet by mouth daily. 01/18/20   Kuppelweiser, Cassie L, RPH-CPP  spironolactone (ALDACTONE) 100 MG tablet Take 1 tablet (100 mg total) by mouth daily. 02/11/20   Elsie Stain, MD    Blood pressure (!) 154/81, pulse 69, temperature 97.9 F (36.6 C), temperature source Oral, resp. rate 18, height 5' 9.5" (1.765 m), weight 85.7 kg, SpO2 97 %. Physical Exam: General: pleasant, WD/WN male who is laying in bed in NAD HEENT: head is normocephalic, atraumatic.  Sclera are noninjected.  Pupils equal and round.  Ears and nose without any masses or lesions.  Mouth is pink and moist. Dentition fair Heart: regular, rate, and rhythm.  Normal s1,s2. No obvious murmurs, gallops, or rubs noted.  Palpable pedal pulses bilaterally  Lungs: CTAB, no wheezes, rhonchi, or rales noted.  Respiratory effort nonlabored Abd: soft, ND, +BS, no masses or organomegaly. Umbilical hernia really not tender and has no overlying  skin changes, it is very tense and I am unable to reduce MS: no BUE/BLE edema, calves soft and nontender Skin: warm and dry with no masses, lesions, or rashes Psych: A&Ox4 with an appropriate affect Neuro: cranial nerves grossly intact, equal strength in BUE/BLE bilaterally, normal speech, thought process intact  Results for orders placed or performed during the hospital encounter of 03/29/20 (from the past 48 hour(s))  CBC with Differential      Status: Abnormal   Collection Time: 03/29/20  5:38 AM  Result Value Ref Range   WBC 6.5 4.0 - 10.5 K/uL   RBC 4.30 4.22 - 5.81 MIL/uL   Hemoglobin 13.4 13.0 - 17.0 g/dL   HCT 39.5 39.0 - 52.0 %   MCV 91.9 80.0 - 100.0 fL   MCH 31.2 26.0 - 34.0 pg   MCHC 33.9 30.0 - 36.0 g/dL   RDW 14.8 11.5 - 15.5 %   Platelets 81 (L) 150 - 400 K/uL    Comment: SPECIMEN CHECKED FOR CLOTS Immature Platelet Fraction may be clinically indicated, consider ordering this additional test [id number] REPEATED TO VERIFY PLATELET COUNT CONFIRMED BY SMEAR    nRBC 0.0 0.0 - 0.2 %   Neutrophils Relative % 79 %   Neutro Abs 5.2 1.7 - 7.7 K/uL   Lymphocytes Relative 17 %   Lymphs Abs 1.1 0.7 - 4.0 K/uL   Monocytes Relative 4 %   Monocytes Absolute 0.2 0.1 - 1.0 K/uL   Eosinophils Relative 0 %   Eosinophils Absolute 0.0 0.0 - 0.5 K/uL   Basophils Relative 0 %   Basophils Absolute 0.0 0.0 - 0.1 K/uL   Immature Granulocytes 0 %   Abs Immature Granulocytes 0.02 0.00 - 0.07 K/uL    Comment: Performed at [facility], [place] [address] [place], [place] [address]  Comprehensive metabolic panel     Status: Abnormal   Collection Time: 03/29/20  5:38 AM  Result Value Ref Range   Sodium 132 (L) 135 - 145 mmol/L   Potassium 4.0 3.5 - 5.1 mmol/L   Chloride 104 98 - 111 mmol/L   CO2 19 (L) 22 - 32 mmol/L   Glucose, Bld 131 (H) 70 - 99 mg/dL    Comment: Glucose reference range applies only to samples taken after fasting for at least 8 hours.   BUN 19 8 - 23 mg/dL   Creatinine, Ser 0.77 0.61 - 1.24 mg/dL   Calcium 9.3 8.9 - 10.3 mg/dL   Total Protein 8.0 6.5 - 8.1 g/dL   Albumin 3.7 3.5 - 5.0 g/dL   AST 42 (H) 15 - 41 U/L   ALT 25 0 - 44 U/L   Alkaline Phosphatase 79 38 - 126 U/L   Total Bilirubin 1.2 0.3 - 1.2 mg/dL   GFR, Estimated >60 >60 mL/min    Comment: (NOTE) Calculated using the CKD-EPI Creatinine Equation (2021)    Anion gap 9 5 - 15    Comment: Performed at [facility], [place] [address] [place], [place] [address]   No results found.    Assessment/Plan Alcoholic cirrhosis - no alcohol use since 06/2019 HTN Hepatitis C - completed treatment about 1 week ago Thrombocytopenia  Bilateral fat containing inguinal hernias Incarcerated umbilical hernia - CT scan shows incarcerated umbilical hernia containing a single small bowel loop  and small volume ascites without discrete bowel inflammation; stable cirrhosis, ascites, and other stigmata of portal venous hypertension. He has no clinical signs of obstruction.  -  I am unable to reduce at bedside. Dr. Thermon Leyland to see, if unable to reduce we will plan for admission and surgery. Keep NPO.    Wellington Hampshire, [facility] 03/29/2020, 10:25 AM Please see Amion for pager number during day hours 7:00am-4:30pm

## 2020-03-29 NOTE — Anesthesia Postprocedure Evaluation (Signed)
Anesthesia Post Note  Patient: Tyrek Lawhorn  Procedure(s) Performed: OPEN UMBILICAL HERNIA REPAIR, BOWEL RESECTION (N/A )     Patient location during evaluation: PACU Anesthesia Type: General Level of consciousness: awake and alert and oriented Pain management: pain level controlled Vital Signs Assessment: post-procedure vital signs reviewed and stable Respiratory status: spontaneous breathing, nonlabored ventilation and respiratory function stable Cardiovascular status: blood pressure returned to baseline and stable Postop Assessment: no apparent nausea or vomiting Anesthetic complications: no   No complications documented.  Last Vitals:  Vitals:   03/29/20 1840 03/29/20 1853  BP: 118/75 140/84  Pulse: (!) 59 (!) 58  Resp: 12 16  Temp: 36.7 C 36.7 C  SpO2: 95% 94%    Last Pain:  Vitals:   03/29/20 1853  TempSrc: Oral  PainSc: 0-No pain                 Legrande Hao A.

## 2020-03-29 NOTE — Anesthesia Procedure Notes (Signed)
Procedure Name: Intubation Performed by: Rosaland Lao, CRNA Pre-anesthesia Checklist: Patient identified, Emergency Drugs available, Suction available and Patient being monitored Patient Re-evaluated:Patient Re-evaluated prior to induction Oxygen Delivery Method: Circle system utilized Preoxygenation: Pre-oxygenation with 100% oxygen Induction Type: Rapid sequence Ventilation: Mask ventilation without difficulty Laryngoscope Size: Miller and 3 Grade View: Grade I Tube type: Oral Number of attempts: 1 Airway Equipment and Method: Stylet and Oral airway Placement Confirmation: ETT inserted through vocal cords under direct vision,  positive ETCO2 and breath sounds checked- equal and bilateral Secured at: 22 cm Tube secured with: Tape Dental Injury: Teeth and Oropharynx as per pre-operative assessment

## 2020-03-29 NOTE — Anesthesia Preprocedure Evaluation (Addendum)
Anesthesia Evaluation  Patient identified by MRN, date of birth, ID band Patient awake    Reviewed: Allergy & Precautions, NPO status , Patient's Chart, lab work & pertinent test results  History of Anesthesia Complications Negative for: history of anesthetic complications  Airway Mallampati: II  TM Distance: >3 FB Neck ROM: Full    Dental no notable dental hx. (+) Dental Advisory Given   Pulmonary neg pulmonary ROS,    Pulmonary exam normal        Cardiovascular hypertension, Normal cardiovascular exam  IMPRESSIONS    1. Left ventricular ejection fraction, by estimation, is 60 to 65%. The  left ventricle has normal function. The left ventricle has no regional  wall motion abnormalities. There is mild concentric left ventricular  hypertrophy. Left ventricular diastolic  parameters were normal.  2. No evidence of right heart failure. Right ventricular systolic  function is normal. The right ventricular size is normal. There is normal  pulmonary artery systolic pressure.  3. The mitral valve is normal in structure. Trivial mitral valve  regurgitation. No evidence of mitral stenosis.  4. The aortic valve is tricuspid. There is mild calcification of the  aortic valve. There is mild thickening of the aortic valve. Aortic valve  regurgitation is not visualized. No aortic stenosis is present.  5. The inferior vena cava is normal in size with <50% respiratory  variability, suggesting right atrial pressure of 8 mmHg.    Neuro/Psych negative neurological ROS     GI/Hepatic negative GI ROS, (+) Cirrhosis   ascites  substance abuse  alcohol use, Hepatitis -Incarcerated hernia   Endo/Other  negative endocrine ROS  Renal/GU negative Renal ROS     Musculoskeletal negative musculoskeletal ROS (+)   Abdominal   Peds  Hematology negative hematology ROS (+)   Anesthesia Other Findings   Reproductive/Obstetrics                             Anesthesia Physical Anesthesia Plan  ASA: III and emergent  Anesthesia Plan: General   Post-op Pain Management:    Induction: Intravenous, Rapid sequence and Cricoid pressure planned  PONV Risk Score and Plan: 4 or greater and Ondansetron, Dexamethasone, Treatment may vary due to age or medical condition and Midazolam  Airway Management Planned: Oral ETT  Additional Equipment:   Intra-op Plan:   Post-operative Plan: Extubation in OR  Informed Consent: I have reviewed the patients History and Physical, chart, labs and discussed the procedure including the risks, benefits and alternatives for the proposed anesthesia with the patient or authorized representative who has indicated his/her understanding and acceptance.     Dental advisory given  Plan Discussed with: Anesthesiologist and CRNA  Anesthesia Plan Comments:        Anesthesia Quick Evaluation

## 2020-03-29 NOTE — Consult Note (Deleted)
[facility] Surgery Consult Note  Richard Yang 05/19/1955  [id number].    Requesting MD: Isla Pence Chief Complaint/Reason for Consult: umbilical hernia  HPI:  Richard Yang is a 89LG male PMH alcoholic cirrhosis, HTN and hepatitis C (completed treatment about 1 week ago) who presented to [facility] earlier today with painful umbilical hernia and unable to self reduce. He has seen Dr. Thermon Leyland in our office with plans for surgical fixation after colonoscopy. Colonoscopy is scheduled for 04/12/20 with Dr. Rush Landmark. States that the hernia has been out now for about 2 days after tripping over a railroad track. He reports worsening pain/swelling at this area but denies n/v. Passing flatus and last BM was yesterday. He has had nothing to eat/drink today but was tolerating PO yesterday. EDP unable to reduce, general surgery asked to see. His WBC is WNL, VSS. LFTs down from previously with AST 42, ALT 25, Alk phos 79, Tbili 1.2. Platelets 81.  Abdominal surgical history: none Anticoagulants: none Nonsmoker Prior h/o heavy alcohol use, has not drank alcohol since 10/2117 Denies illicit drug use Not currently employed, lives at home alone NKDA  Review of Systems  Constitutional: Negative.   Respiratory: Negative.   Cardiovascular: Negative.   Gastrointestinal: Positive for abdominal pain. Negative for blood in stool, constipation, diarrhea, nausea and vomiting.  Genitourinary: Negative.   Musculoskeletal: Negative.    All systems reviewed and otherwise negative except for as above  Family History  Problem Relation Age of Onset  . Stomach cancer Neg Hx   . Colon cancer Neg Hx   . Esophageal cancer Neg Hx   . Pancreatic cancer Neg Hx     Past Medical History:  Diagnosis Date  . Ascites   . Cirrhosis ([place])   . Elevated AFP   . Hepatitis C   . Inguinal hernia   . Portal hypertension ([place])   . Substance abuse ([place])    alcohol abuse hx  . Umbilical hernia     Past Surgical  History:  Procedure Laterality Date  . IR PARACENTESIS  09/16/2019  . IR PARACENTESIS  10/09/2019  . IR PARACENTESIS  12/09/2019  . NO PAST SURGERIES      Social History:  reports that he has never smoked. He has never used smokeless tobacco. He reports previous alcohol use. He reports previous drug use.  Allergies: No Known Allergies  (Not in a hospital admission)   Prior to Admission medications   Medication Sig Start Date End Date Taking? Authorizing Provider  Sofosbuvir-Velpatasvir (EPCLUSA) 400-100 MG TABS Take 1 tablet by mouth daily. Take 1 tablet by mouth daily. 01/18/20   Kuppelweiser, Cassie L, RPH-CPP  spironolactone (ALDACTONE) 100 MG tablet Take 1 tablet (100 mg total) by mouth daily. 02/11/20   Elsie Stain, MD    Blood pressure (!) 154/81, pulse 69, temperature 97.9 F (36.6 C), temperature source Oral, resp. rate 18, height 5' 9.5" (1.765 m), weight 85.7 kg, SpO2 97 %. Physical Exam: General: pleasant, WD/WN male who is laying in bed in NAD HEENT: head is normocephalic, atraumatic.  Sclera are noninjected.  Pupils equal and round.  Ears and nose without any masses or lesions.  Mouth is pink and moist. Dentition fair Heart: regular, rate, and rhythm.  Normal s1,s2. No obvious murmurs, gallops, or rubs noted.  Palpable pedal pulses bilaterally  Lungs: CTAB, no wheezes, rhonchi, or rales noted.  Respiratory effort nonlabored Abd: soft, ND, +BS, no masses or organomegaly. Umbilical hernia really not tender and has no overlying  skin changes, it is very tense and I am unable to reduce MS: no BUE/BLE edema, calves soft and nontender Skin: warm and dry with no masses, lesions, or rashes Psych: A&Ox4 with an appropriate affect Neuro: cranial nerves grossly intact, equal strength in BUE/BLE bilaterally, normal speech, thought process intact  Results for orders placed or performed during the hospital encounter of 03/29/20 (from the past 48 hour(s))  CBC with Differential      Status: Abnormal   Collection Time: 03/29/20  5:38 AM  Result Value Ref Range   WBC 6.5 4.0 - 10.5 K/uL   RBC 4.30 4.22 - 5.81 MIL/uL   Hemoglobin 13.4 13.0 - 17.0 g/dL   HCT 39.5 39.0 - 52.0 %   MCV 91.9 80.0 - 100.0 fL   MCH 31.2 26.0 - 34.0 pg   MCHC 33.9 30.0 - 36.0 g/dL   RDW 14.8 11.5 - 15.5 %   Platelets 81 (L) 150 - 400 K/uL    Comment: SPECIMEN CHECKED FOR CLOTS Immature Platelet Fraction may be clinically indicated, consider ordering this additional test [id number] REPEATED TO VERIFY PLATELET COUNT CONFIRMED BY SMEAR    nRBC 0.0 0.0 - 0.2 %   Neutrophils Relative % 79 %   Neutro Abs 5.2 1.7 - 7.7 K/uL   Lymphocytes Relative 17 %   Lymphs Abs 1.1 0.7 - 4.0 K/uL   Monocytes Relative 4 %   Monocytes Absolute 0.2 0.1 - 1.0 K/uL   Eosinophils Relative 0 %   Eosinophils Absolute 0.0 0.0 - 0.5 K/uL   Basophils Relative 0 %   Basophils Absolute 0.0 0.0 - 0.1 K/uL   Immature Granulocytes 0 %   Abs Immature Granulocytes 0.02 0.00 - 0.07 K/uL    Comment: Performed at [facility], [place] [address], [place], [place] [address]  Comprehensive metabolic panel     Status: Abnormal   Collection Time: 03/29/20  5:38 AM  Result Value Ref Range   Sodium 132 (L) 135 - 145 mmol/L   Potassium 4.0 3.5 - 5.1 mmol/L   Chloride 104 98 - 111 mmol/L   CO2 19 (L) 22 - 32 mmol/L   Glucose, Bld 131 (H) 70 - 99 mg/dL    Comment: Glucose reference range applies only to samples taken after fasting for at least 8 hours.   BUN 19 8 - 23 mg/dL   Creatinine, Ser 0.77 0.61 - 1.24 mg/dL   Calcium 9.3 8.9 - 10.3 mg/dL   Total Protein 8.0 6.5 - 8.1 g/dL   Albumin 3.7 3.5 - 5.0 g/dL   AST 42 (H) 15 - 41 U/L   ALT 25 0 - 44 U/L   Alkaline Phosphatase 79 38 - 126 U/L   Total Bilirubin 1.2 0.3 - 1.2 mg/dL   GFR, Estimated >60 >60 mL/min    Comment: (NOTE) Calculated using the CKD-EPI Creatinine Equation (2021)    Anion gap 9 5 - 15    Comment: Performed at [facility], [place] [address] [place], [place] [address]   No results found.    Assessment/Plan Alcoholic cirrhosis - no alcohol use since 06/2019 HTN Hepatitis C - completed treatment about 1 week ago Thrombocytopenia  Bilateral fat containing inguinal hernias Incarcerated umbilical hernia - CT scan shows incarcerated umbilical hernia containing a single small bowel loop  and small volume ascites without discrete bowel inflammation; stable cirrhosis, ascites, and other stigmata of portal venous hypertension. He has no clinical signs of obstruction.  -  I am unable to reduce at bedside. Dr. Thermon Leyland to see, if unable to reduce we will plan for admission and surgery. Keep NPO.    Wellington Hampshire, [facility] 03/29/2020, 10:25 AM Please see Amion for pager number during day hours 7:00am-4:30pm

## 2020-03-29 NOTE — Op Note (Signed)
Patient: Richard Yang (Jan 14, 1956, [id number])  Date of Surgery: 03/29/2020   Preoperative Diagnosis: incarcerated umbilical and bilateral  inguinal hernia   Postoperative Diagnosis: incarcerated umbilical and bilateral  inguinal hernias, necrotic segment of small intestine  Surgical Procedure: Small bowel resection Open umbilical hernia repair  Operative Team Members:  Richard Yang, Richard Major, MD - Primary   Anesthesiologist: Richard Igo, MD; Richard Boston, MD CRNA: Richard Morales, CRNA; Richard Lao, CRNA   Anesthesia: General   Fluids:  Total I/O In: 1250 [IV Piggyback:1250] Out: 1200 [Other:1000; SNKNL:976]  Complications: None  Drains:  none   Specimen:  ID Type Source Tests Collected by Time Destination  1 : small bowel Tissue PATH GI benign resection SURGICAL PATHOLOGY Richard Yang, Richard Major, MD 03/29/2020 1648      Disposition:  PACU - hemodynamically stable.  Plan of Care: Admit to inpatient    Indications for Procedure: Richard Yang is a 65 y.o. male who presented with an incarcerated umbilical hernia.  The decision was made to proceed with the procedure as listed above.  I discussed if there was no necrotic bowel, I would proceed with a larger mesh hernia repair of both his inguinal hernias and his umbilical hernia.  However if but small bowel resection was required I would avoid using mesh and just fix the umbilical hernia primarily.  A full discussion of the risk, benefits, and alternatives was completed.  The risk discussed included but were not limited to the risk of infection, bleeding, damage nearby structures, mesh infection requiring mesh removal, and recurrent hernia.  After full discussion all questions answered the patient granted consent to proceed to the operating room.  Findings:  Strangulated segment of small intestine inside umbilical hernia.   1 L of ascites.  Description of Procedure:   On the date stated above the patient was taken the  operating room suite and placed in supine position.  General endotracheal anesthesia was induced.  A timeout was completed verifying the correct patient, procedure, positioning, and equipment needed for the case.  The patient's abdomen was prepped and draped in usual sterile fashion.  I began by making a vertical incision over the umbilical hernia.  I dissected out the hernia sac down to the fascia of the anterior abdominal wall.  I entered the hernia sac and encountered some bloody ascites.  Once this was evacuated I identified a 6 short segment of necrotic small intestine.  The fascial defect was lengthened superiorly and inferiorly slightly in order to eviscerate the strangulated segment of small intestine as well as a short length of healthy intestine for an anastomosis.  I performed a small bowel resection by dividing the bowel proximally distally with a stapler and dividing the mesentery using Kelly clamps and 3-0 Vicryl suture for hemostasis.  Any bleeding was oversewn using 3-0 Vicryl suture.  I created the small intestinal anastomosis using the GIA stapler.  Enterotomies were made in the antimesenteric border of each limb of bowel, the 75 mm GIA stapler was inserted into the lumen and fired to create our anastomosis.  The common enterotomy was closed using another 75 mm load of the GIA stapler.  Hemostasis was obtained along the staple line using 3-0 Vicryl sutures in a figure-of-eight.  The staple line closing the common enterotomy was then imbricated using 3-0 Vicryl sutures.  A 3-0 Vicryl suture was placed at the crotch of the staple line to reinforce the end of the staple line.  The mesenteric defect was closed  using 3-0 Vicryl suture.  The small bowel was then returned within the abdomen I directed my attention to the hernia. Two  2-0 PDS sutures were used to run the fascia closed using small 5 mm bites with 5 mm advancements.  The umbilical stalk was tacked back down to the fascia of the anterior  abdominal wall using a 3-0 Vicryl suture.  The subcutaneous and deep dermal layers of the abdominal wall were reapproximated using 3-0 Vicryl suture.  The skin was closed using 4-0 Monocryl and Dermabond.  All sponge and needle counts were correct at the end of this case.    Richard Raw, MD General, Bariatric, & Minimally Invasive Surgery [facility], [place]

## 2020-03-29 NOTE — ED Provider Notes (Signed)
[facility] DEPT Provider Note   CSN: [id number] Arrival date & time: 03/28/20  2013     History Chief Complaint  Patient presents with  . Hernia  . Abdominal Pain    Richard Yang is a 65 y.o. male.  Pt presents to the ED today with an umbilical hernia that is painful and unable to be reduced.  Pt has a hx of an umbilical hernia and said he has followed up with surgery (Dr. Thermon Leyland).  Pt said surgery has not been scheduled, because the surgeon wanted to wait until after pt had his colonoscopy on March 8.  Pt denies any n/v. It has been out for 2 days.  He said he tripped and fell over a railroad tie and fell onto his abdomen.        Past Medical History:  Diagnosis Date  . Ascites   . Cirrhosis ([place])   . Elevated AFP   . Hepatitis C   . Inguinal hernia   . Portal hypertension ([place])   . Substance abuse ([place])    alcohol abuse hx  . Umbilical hernia     Patient Active Problem List   Diagnosis Date Noted  . Other cirrhosis of liver ([place]) 11/16/2019  . Ascites of liver 11/16/2019  . Elevated AFP 11/16/2019  . Alcohol use disorder, moderate, in early remission, dependence ([place]) 11/16/2019  . Chronic hepatitis C without hepatic coma ([place]) 09/23/2019  . Portal hypertension ([place]) 08/06/2019  . Thrombocytopenia ([place]) 08/06/2019  . Umbilical hernia 93/79/0240  . Inguinal hernia 07/13/2019  . Aortic atherosclerosis ([place]) 07/13/2019  . History of alcohol use 07/13/2019  . Liver cirrhosis, alcoholic ([place]) 97/35/3299  . Hypertension 06/24/2019    Past Surgical History:  Procedure Laterality Date  . IR PARACENTESIS  09/16/2019  . IR PARACENTESIS  10/09/2019  . IR PARACENTESIS  12/09/2019  . NO PAST SURGERIES         Family History  Problem Relation Age of Onset  . Stomach cancer Neg Hx   . Colon cancer Neg Hx   . Esophageal cancer Neg Hx   . Pancreatic cancer Neg Hx     Social History   Tobacco Use  . Smoking status: Never Smoker   . Smokeless tobacco: Never Used  Vaping Use  . Vaping Use: Never used  Substance Use Topics  . Alcohol use: Not Currently    Comment: sober since May  . Drug use: Not Currently    Home Medications Prior to Admission medications   Medication Sig Start Date End Date Taking? Authorizing Provider  Sofosbuvir-Velpatasvir (EPCLUSA) 400-100 MG TABS Take 1 tablet by mouth daily. Take 1 tablet by mouth daily. 01/18/20   Kuppelweiser, Cassie L, RPH-CPP  spironolactone (ALDACTONE) 100 MG tablet Take 1 tablet (100 mg total) by mouth daily. 02/11/20   Elsie Stain, MD    Allergies    Patient has no known allergies.  Review of Systems   Review of Systems  Gastrointestinal: Positive for abdominal pain.  All other systems reviewed and are negative.   Physical Exam Updated Vital Signs BP 127/79   Pulse 68   Temp 97.9 F (36.6 C) (Oral)   Resp 18   Ht 5' 9.5" (1.765 m)   Wt 85.7 kg   SpO2 98%   BMI 27.51 kg/m   Physical Exam Vitals and nursing note reviewed.  Constitutional:      Appearance: He is well-developed.  HENT:     Head: Normocephalic and  atraumatic.     Mouth/Throat:     Mouth: Mucous membranes are moist.  Eyes:     Extraocular Movements: Extraocular movements intact.  Cardiovascular:     Rate and Rhythm: Normal rate and regular rhythm.  Pulmonary:     Effort: Pulmonary effort is normal.     Breath sounds: Normal breath sounds.  Abdominal:     General: Bowel sounds are normal.     Hernia: A hernia is present. Hernia is present in the umbilical area.  Skin:    General: Skin is warm.     Capillary Refill: Capillary refill takes less than 2 seconds.  Neurological:     General: No focal deficit present.     Mental Status: He is alert and oriented to person, place, and time.  Psychiatric:        Mood and Affect: Mood normal.        Behavior: Behavior normal.     ED Results / Procedures / Treatments   Labs (all labs ordered are listed, but only abnormal  results are displayed) Labs Reviewed  CBC WITH DIFFERENTIAL/PLATELET - Abnormal; Notable for the following components:      Result Value   Platelets 81 (*)    All other components within normal limits  COMPREHENSIVE METABOLIC PANEL - Abnormal; Notable for the following components:   Sodium 132 (*)    CO2 19 (*)    Glucose, Bld 131 (*)    AST 42 (*)    All other components within normal limits  RESP PANEL BY RT-PCR (FLU A&B, COVID) [id number]  PROTIME-INR    EKG None  Radiology CT ABDOMEN PELVIS W CONTRAST  Result Date: 03/29/2020 CLINICAL DATA:  65 year old male with history of cirrhosis. Abdominal pain with irreducible umbilical hernia. EXAM: CT ABDOMEN AND PELVIS WITH CONTRAST TECHNIQUE: Multidetector CT imaging of the abdomen and pelvis was performed using the standard protocol following bolus administration of intravenous contrast. CONTRAST:  131mL OMNIPAQUE IOHEXOL 300 MG/ML  SOLN COMPARISON:  CT Abdomen and Pelvis 02/17/2020, and earlier. FINDINGS: Lower chest: Stable mild lung base scarring or atelectasis. No pleural effusion. Hepatobiliary: Cirrhotic liver, cholelithiasis, and perihepatic ascites appears stable from last month. No discrete liver lesion. Pancreas: Negative. Spleen: Stable mild splenomegaly, perisplenic ascites. Adrenals/Urinary Tract: Stable and negative adrenal glands, kidneys. Symmetric renal contrast enhancement. Urinary bladder more distended today, estimated bladder volume 330 mL. Stomach/Bowel: Bilobed umbilical hernia containing a small volume of ascites fluid plus a single loop of small bowel (series 2, images 59 and 60) is stable and size and configuration from last month measuring about 7.3 cm diameter with hernia neck of about 12 mm. No surrounding inflammatory stranding. Small volume ascites is stable from last month. No dilated large bowel. Normal appendix suspected on series 2, image 69. Mid abdominal small bowel loops are air and fluid-filled and measuring at  the upper limits of normal as in January. And this appearance does extend to the umbilical hernia (series 2, images 54-59) with decompressed downstream loops. Still, the jejunum is not significantly dilated. Stomach and duodenum remain decompressed. No free air. Vascular/Lymphatic: Aortoiliac calcified atherosclerosis. Major arterial structures remain patent. Portal venous system is patent. Small volume mesenteric varices. No lymphadenopathy. Reproductive: Chronic inguinal hernias, containing fat on the left and fat plus a small volume of ascites on the right (decreased from last month series 2, image 92). Other: No significant pelvic ascites. Musculoskeletal: Chronic unhealed right 8th rib fracture. No acute osseous abnormality identified. IMPRESSION: 1. Unchanged  CT appearance from last month with constellation suspicious for partial small bowel obstruction secondary to a mildly incarcerated umbilical hernia containing a single small bowel loop and small volume ascites. No discrete bowel inflammation. Hernia size roughly 7 cm diameter with a 1.2 cm hernia neck. 2. Stable cirrhosis, ascites, and other stigmata of portal venous hypertension. 3. Urinary bladder more distended today, estimated volume 330 mL. 4. Chronic cholelithiasis, inguinal hernias, Aortic Atherosclerosis (ICD10-I70.0). Electronically Signed   By: Genevie Ann M.D.   On: 03/29/2020 10:52    Procedures Hernia reduction  Date/Time: 03/29/2020 11:17 AM Performed by: Isla Pence, MD Authorized by: Isla Pence, MD  Consent: Verbal consent obtained. Risks and benefits: risks, benefits and alternatives were discussed Consent given by: patient Patient understanding: patient states understanding of the procedure being performed Patient identity confirmed: verbally with patient Local anesthesia used: no  Anesthesia: Local anesthesia used: no  Sedation: Patient sedated: no  Comments: Hernia unable to be reduced by me.       Medications Ordered in ED Medications  sodium chloride 0.9 % bolus 1,000 mL (0 mLs Intravenous Stopped 03/29/20 0925)  morphine 4 MG/ML injection 4 mg (4 mg Intravenous Given 03/29/20 0822)  ondansetron (ZOFRAN) injection 4 mg (4 mg Intravenous Given 03/29/20 0821)  LORazepam (ATIVAN) injection 1 mg (1 mg Intravenous Given 03/29/20 0847)  iohexol (OMNIPAQUE) 300 MG/ML solution 100 mL (100 mLs Intravenous Contrast Given 03/29/20 1014)    ED Course  I have reviewed the triage vital signs and the nursing notes.  Pertinent labs & imaging results that were available during my care of the patient were reviewed by me and considered in my medical decision making (see chart for details).    MDM Rules/Calculators/A&P                          I attempted to reduce this hernia after morphine and ativan.  However, I was unsuccessful.  I did consult surgery.  They were also unable to get the hernia to reduce, so they took him to the OR.  covid negative.   Final Clinical Impression(s) / ED Diagnoses Final diagnoses:  Incarcerated umbilical hernia    Rx / DC Orders ED Discharge Orders    None       Isla Pence, MD 03/30/20 [phone]

## 2020-03-29 NOTE — Transfer of Care (Signed)
Immediate Anesthesia Transfer of Care Note  Patient: Richard Yang  Procedure(s) Performed: OPEN UMBILICAL HERNIA REPAIR, BOWEL RESECTION (N/A )  Patient Location: PACU  Anesthesia Type:General  Level of Consciousness: awake, alert , oriented and patient cooperative  Airway & Oxygen Therapy: Patient Spontanous Breathing and Patient connected to face mask oxygen  Post-op Assessment: Report given to RN, Post -op Vital signs reviewed and stable and Patient moving all extremities X 4  Post vital signs: stable  Last Vitals:  Vitals Value Taken Time  BP 150/83 03/29/20 1800  Temp 36.8 C 03/29/20 1800  Pulse 65 03/29/20 1809  Resp 18 03/29/20 1809  SpO2 94 % 03/29/20 1809  Vitals shown include unvalidated device data.  Last Pain:  Vitals:   03/29/20 1800  TempSrc:   PainSc: 0-No pain      Patients Stated Pain Goal: 3 (80/16/55 3748)  Complications: No complications documented.

## 2020-03-29 NOTE — Progress Notes (Signed)
Tried to call patient's sister Cletis [place] to provide an update after surgery on both the old number [phone], and the new [phone] with no answer.

## 2020-03-30 ENCOUNTER — Encounter (HOSPITAL_COMMUNITY): Payer: Self-pay | Admitting: Surgery

## 2020-03-30 DIAGNOSIS — Z79899 Other long term (current) drug therapy: Secondary | ICD-10-CM | POA: Diagnosis not present

## 2020-03-30 DIAGNOSIS — Z20822 Contact with and (suspected) exposure to covid-19: Secondary | ICD-10-CM | POA: Diagnosis not present

## 2020-03-30 DIAGNOSIS — K420 Umbilical hernia with obstruction, without gangrene: Secondary | ICD-10-CM | POA: Diagnosis present

## 2020-03-30 DIAGNOSIS — K421 Umbilical hernia with gangrene: Secondary | ICD-10-CM | POA: Diagnosis not present

## 2020-03-30 DIAGNOSIS — K7031 Alcoholic cirrhosis of liver with ascites: Secondary | ICD-10-CM | POA: Diagnosis not present

## 2020-03-30 DIAGNOSIS — K4010 Bilateral inguinal hernia, with gangrene, not specified as recurrent: Secondary | ICD-10-CM | POA: Diagnosis not present

## 2020-03-30 DIAGNOSIS — D6959 Other secondary thrombocytopenia: Secondary | ICD-10-CM | POA: Diagnosis not present

## 2020-03-30 DIAGNOSIS — K766 Portal hypertension: Secondary | ICD-10-CM | POA: Diagnosis not present

## 2020-03-30 DIAGNOSIS — B1920 Unspecified viral hepatitis C without hepatic coma: Secondary | ICD-10-CM | POA: Diagnosis not present

## 2020-03-30 DIAGNOSIS — K55029 Acute infarction of small intestine, extent unspecified: Secondary | ICD-10-CM | POA: Diagnosis not present

## 2020-03-30 DIAGNOSIS — I10 Essential (primary) hypertension: Secondary | ICD-10-CM | POA: Diagnosis not present

## 2020-03-30 LAB — CBC
HCT: 41 % (ref 39.0–52.0)
Hemoglobin: 13.5 g/dL (ref 13.0–17.0)
MCH: 31.5 pg (ref 26.0–34.0)
MCHC: 32.9 g/dL (ref 30.0–36.0)
MCV: 95.6 fL (ref 80.0–100.0)
Platelets: 87 10*3/uL — ABNORMAL LOW (ref 150–400)
RBC: 4.29 MIL/uL (ref 4.22–5.81)
RDW: 15.3 % (ref 11.5–15.5)
WBC: 16.2 10*3/uL — ABNORMAL HIGH (ref 4.0–10.5)
nRBC: 0 % (ref 0.0–0.2)

## 2020-03-30 LAB — COMPREHENSIVE METABOLIC PANEL
ALT: 20 U/L (ref 0–44)
AST: 42 U/L — ABNORMAL HIGH (ref 15–41)
Albumin: 3.5 g/dL (ref 3.5–5.0)
Alkaline Phosphatase: 64 U/L (ref 38–126)
Anion gap: 9 (ref 5–15)
BUN: 19 mg/dL (ref 8–23)
CO2: 22 mmol/L (ref 22–32)
Calcium: 8.7 mg/dL — ABNORMAL LOW (ref 8.9–10.3)
Chloride: 103 mmol/L (ref 98–111)
Creatinine, Ser: 0.95 mg/dL (ref 0.61–1.24)
GFR, Estimated: >60 mL/min (ref >60)
Glucose, Bld: 110 mg/dL — ABNORMAL HIGH (ref 70–99)
Potassium: 3.9 mmol/L (ref 3.5–5.1)
Sodium: 134 mmol/L — ABNORMAL LOW (ref 135–145)
Total Bilirubin: 2 mg/dL — ABNORMAL HIGH (ref 0.3–1.2)
Total Protein: 7.3 g/dL (ref 6.5–8.1)

## 2020-03-30 NOTE — Discharge Instructions (Signed)
[facility] _______[facility] ° °UMBILICAL  HERNIA REPAIR: POST OP INSTRUCTIONS ° °Always review your discharge instruction sheet given to you by the facility where your surgery was performed. °IF YOU HAVE DISABILITY OR FAMILY LEAVE FORMS, YOU MUST BRING THEM TO THE OFFICE FOR PROCESSING.   °DO NOT GIVE THEM TO YOUR DOCTOR. ° °1. A  prescription for pain medication may be given to you upon discharge.  Take your pain medication as prescribed, if needed.  If narcotic pain medicine is not needed, then you may take acetaminophen (Tylenol) or ibuprofen (Advil) as needed. °2. Take your usually prescribed medications unless otherwise directed. °If you need a refill on your pain medication, please contact your pharmacy.  They will contact our office to request authorization. Prescriptions will not be filled after 5 pm or on week-ends. °3. You should follow a light diet the first 24 hours after arrival home, such as soup and crackers, etc.  Be sure to include lots of fluids daily.  Resume your normal diet the day after surgery. °4.Most patients will experience some swelling and bruising around the umbilicus or in the groin and scrotum.  Ice packs and reclining will help.  Swelling and bruising can take several days to resolve.  °6. It is common to experience some constipation if taking pain medication after surgery.  Increasing fluid intake and taking a stool softener (such as Colace) will usually help or prevent this problem from occurring.  A mild laxative (Milk of Magnesia or Miralax) should be taken according to package directions if there are no bowel movements after 48 hours. °7. Unless discharge instructions indicate otherwise, you may remove your bandages 24-48 hours after surgery, and you may shower at that time.  You may have steri-strips (small skin tapes) in place directly over the incision.  These strips should be left on the skin for 7-10 days.  If your surgeon used skin glue on the incision, you may  shower in 24 hours.  The glue will flake off over the next 2-3 weeks.  Any sutures or staples will be removed at the office during your follow-up visit. °8. ACTIVITIES:  You may resume regular (light) daily activities beginning the next day--such as daily self-care, walking, climbing stairs--gradually increasing activities as tolerated.  You may have sexual intercourse when it is comfortable.  Refrain from any heavy lifting or straining until approved by your doctor. ° °a.You may drive when you are no longer taking prescription pain medication, you can comfortably wear a seatbelt, and you can safely maneuver your car and apply brakes. °b.RETURN TO WORK:   °_____________________________________________ ° °9.You should see your doctor in the office for a follow-up appointment approximately 2-3 weeks after your surgery.  Make sure that you call for this appointment within a day or two after you arrive home to insure a convenient appointment time. °10.OTHER INSTRUCTIONS: _________________________ °   _____________________________________ ° °WHEN TO CALL YOUR DOCTOR: °1. Fever over 101.0 °2. Inability to urinate °3. Nausea and/or vomiting °4. Extreme swelling or bruising °5. Continued bleeding from incision. °6. Increased pain, redness, or drainage from the incision ° °The clinic staff is available to answer your questions during regular business hours.  Please don’t hesitate to call and ask to speak to one of the nurses for clinical concerns.  If you have a medical emergency, go to the nearest emergency room or call 911.  A surgeon from [facility] is always on call at the hospital ° ° °[address], [address], [place], [place]  [address] ? ° [address] [address], [place], [place]   [address] °[phone] ? [phone] ? FAX [phone] °Web site: [email or web address] ° °

## 2020-03-30 NOTE — Progress Notes (Signed)
Progress Note: General Surgery Service   Chief Complaint/Subjective: Doing well after surgery yesterday.  Some pain.  No nausea no vomiting, tolerating some fluids.  No flatus or bowel movements yet.  Objective: Vital signs in last 24 hours: Temp:  [97.9 F (36.6 C)-100.2 F (37.9 C)] 98.5 F (36.9 C) (02/23 0942) Pulse Rate:  [58-91] 91 (02/23 0942) Resp:  [12-20] 16 (02/23 0942) BP: (108-186)/(67-95) 138/82 (02/23 0942) SpO2:  [93 %-100 %] 96 % (02/23 0942)    Intake/Output from previous day: 02/22 0701 - 02/23 0700 In: 2718.4 [I.V.:1218.4; IV Piggyback:1500] Out: 1450 [Urine:250; Blood:200] Intake/Output this shift: Total I/O In: 360 [P.O.:360] Out: -   Constitutional: NAD; conversant; no deformities Eyes: Moist conjunctiva; no lid lag; anicteric; PERRL Neck: Trachea midline; no thyromegaly Lungs: Normal respiratory effort; no tactile fremitus CV: RRR; no palpable thrills; no pitting edema GI: Abd Soft, incision c/d/i w/ glue, tenderness around incision; no palpable hepatosplenomegaly MSK: Normal range of motion of extremities; no clubbing/cyanosis Psychiatric: Appropriate affect; alert and oriented x3 Lymphatic: No palpable cervical or axillary lymphadenopathy  Lab Results: CBC  Recent Labs    03/29/20 0538 03/30/20 0506  WBC 6.5 16.2*  HGB 13.4 13.5  HCT 39.5 41.0  PLT 81* 87*   BMET Recent Labs    03/29/20 0538 03/30/20 0506  NA 132* 134*  K 4.0 3.9  CL 104 103  CO2 19* 22  GLUCOSE 131* 110*  BUN 19 19  CREATININE 0.77 0.95  CALCIUM 9.3 8.7*   PT/INR Recent Labs    03/29/20 1424  LABPROT 14.2  INR 1.1   ABG No results for input(s): PHART, HCO3 in the last 72 hours.  Invalid input(s): PCO2, PO2  Anti-infectives: Anti-infectives (From admission, onward)   Start     Dose/Rate Route Frequency Ordered Stop   03/30/20 0600  cefoTEtan (CEFOTAN) 2 g in sodium chloride 0.9 % 100 mL IVPB        2 g 200 mL/hr over 30 Minutes Intravenous On call  to O.R. 03/29/20 1423 03/29/20 1607      Medications: Scheduled Meds: . spironolactone  100 mg Oral Daily   Continuous Infusions: . sodium chloride 75 mL/hr at 03/30/20 0224  . methocarbamol (ROBAXIN) IV     PRN Meds:.acetaminophen **OR** acetaminophen, diphenhydrAMINE **OR** diphenhydrAMINE, methocarbamol (ROBAXIN) IV, metoprolol tartrate, morphine injection, ondansetron **OR** ondansetron (ZOFRAN) IV, oxyCODONE, polyethylene glycol, simethicone  Assessment/Plan: Richard Yang is a 65 year old male cirrhotic who presented with a strangulated umbilical hernia now status post primary open umbilical hernia repair with small bowel resection on 03/29/2020.  Strangulated umbilical hernia in patient with cirrhosis -Liquid diet -Minimize IV fluids -Monitor for accumulation of ascites that may need paracentesis -Continue home diuretics -Increase activity -Nausea control as needed -Turn of bowel function   LOS: 0 days   FEN: Liquid diet, minimize IV fluids ID: None VTE: SCD, hold off on anticoagulants due to cirrhosis and thrombocytopenia Foley: none Dispo: Continued care on floor till return of bowel function and tolerating a diet    Felicie Morn, MD  [facility] Use [email or web address] to contact on call provider

## 2020-03-31 LAB — SURGICAL PATHOLOGY

## 2020-03-31 MED ORDER — METHOCARBAMOL 500 MG PO TABS
500.0000 mg | ORAL_TABLET | Freq: Four times a day (QID) | ORAL | Status: DC
  Administered 2020-03-31 – 2020-04-02 (×8): 500 mg via ORAL
  Filled 2020-03-31 (×8): qty 1

## 2020-03-31 MED ORDER — METHOCARBAMOL 500 MG PO TABS: 750.0000 mg | ORAL_TABLET | Freq: Three times a day (TID) | ORAL | Status: DC

## 2020-03-31 NOTE — Progress Notes (Signed)
2 Days Post-Op    CC: Abdominal pain  Subjective: Patient is up walking some now, rare flatus no BM. Tolerating full liquids port sites all look good.  Objective: Vital signs in last 24 hours: Temp:  [97.5 F (36.4 C)-98.5 F (36.9 C)] 98.5 F (36.9 C) (02/24 0623) Pulse Rate:  [68-91] 79 (02/24 0623) Resp:  [16-18] 16 (02/24 0623) BP: (106-138)/(62-82) 127/74 (02/24 0623) SpO2:  [96 %-99 %] 98 % (02/24 0623)  1005 p.o. 600 IV 450 urine No BM Afebrile yesterday, NA 134, glucose 110, total bilirubin 2.0 WBC 16.2 Platelets 87,000 Intake/Output from previous day: 02/23 0701 - 02/24 0700 In: 1551.6 [P.O.:1005; I.V.:546.6] Out: 450 [Urine:450] Intake/Output this shift: Total I/O In: -  Out: 200 [Urine:200]  General appearance: alert, cooperative and no distress Resp: Clear, using I-S. GI: Soft, sore, bowel sounds are hypoactive. Rare flatus, no BM.  Lab Results:  Recent Labs    03/29/20 0538 03/30/20 0506  WBC 6.5 16.2*  HGB 13.4 13.5  HCT 39.5 41.0  PLT 81* 87*    BMET Recent Labs    03/29/20 0538 03/30/20 0506  NA 132* 134*  K 4.0 3.9  CL 104 103  CO2 19* 22  GLUCOSE 131* 110*  BUN 19 19  CREATININE 0.77 0.95  CALCIUM 9.3 8.7*   PT/INR Recent Labs    03/29/20 1424  LABPROT 14.2  INR 1.1    Recent Labs  Lab 03/29/20 0538 03/30/20 0506  AST 42* 42*  ALT 25 20  ALKPHOS 79 64  BILITOT 1.2 2.0*  PROT 8.0 7.3  ALBUMIN 3.7 3.5     Lipase     Component Value Date/Time   LIPASE 75 (H) 01/16/2020 2003     Medications: . spironolactone  100 mg Oral Daily   . methocarbamol (ROBAXIN) IV      Assessment/Plan Cirrhosis Ascites -1 L drained during surgery Hepatitis C Portal hypertension Hx substance abuse Thrombocytopenia  -Platelets 81K>>87K    Incarcerated umbilical hernia, bilateral inguinal hernias with necrotic segment of small bowel. Open umbilical hernia repair, small bowel resection, 03/29/2020, Dr. Eddie Dibbles Stechschulte POD  #2  -WBC 6.5>> 16.2 FEN: Full liquid diet ID: Cefotetan preop DVT: SCDs Follow-up: Dr. Thermon Leyland   Plan: Continue full liquids for now recheck labs in the a.m. Advance diet as soon as bowel function returns.    LOS: 1 day    Helia Haese 03/31/2020 Please see Amion

## 2020-03-31 NOTE — Progress Notes (Signed)
Chaplain engaged in initial visit with Cristie Hem.  Chaplain learned that Shamere moved to [place] about six years ago from [place] to be closer to family and because [place].  Jeremie also explained what brought him into the hospital and described the incredible pain he was in.  Javarius expressed a gratefulness for the staff and how he tries to do what he can to not disturb them unless he needs too.  Chaplain met Zayquan while he was up walking the hallways on his on.  Chaplain affirmed the character and spirit of Jonahtan while being on the unit.  Chaplain is available to follow-up.    03/31/20 1200  Clinical Encounter Type  Visited With Patient  Visit Type Initial

## 2020-04-01 DIAGNOSIS — K420 Umbilical hernia with obstruction, without gangrene: Secondary | ICD-10-CM | POA: Diagnosis not present

## 2020-04-01 DIAGNOSIS — K421 Umbilical hernia with gangrene: Secondary | ICD-10-CM | POA: Diagnosis not present

## 2020-04-01 LAB — COMPREHENSIVE METABOLIC PANEL
ALT: 18 U/L (ref 0–44)
AST: 29 U/L (ref 15–41)
Albumin: 3 g/dL — ABNORMAL LOW (ref 3.5–5.0)
Alkaline Phosphatase: 52 U/L (ref 38–126)
Anion gap: 8 (ref 5–15)
BUN: 19 mg/dL (ref 8–23)
CO2: 23 mmol/L (ref 22–32)
Calcium: 8.5 mg/dL — ABNORMAL LOW (ref 8.9–10.3)
Chloride: 102 mmol/L (ref 98–111)
Creatinine, Ser: 1 mg/dL (ref 0.61–1.24)
GFR, Estimated: >60 mL/min (ref >60)
Glucose, Bld: 109 mg/dL — ABNORMAL HIGH (ref 70–99)
Potassium: 3.7 mmol/L (ref 3.5–5.1)
Sodium: 133 mmol/L — ABNORMAL LOW (ref 135–145)
Total Bilirubin: 2.2 mg/dL — ABNORMAL HIGH (ref 0.3–1.2)
Total Protein: 6.4 g/dL — ABNORMAL LOW (ref 6.5–8.1)

## 2020-04-01 LAB — CBC
HCT: 34.9 % — ABNORMAL LOW (ref 39.0–52.0)
Hemoglobin: 11.6 g/dL — ABNORMAL LOW (ref 13.0–17.0)
MCH: 31.4 pg (ref 26.0–34.0)
MCHC: 33.2 g/dL (ref 30.0–36.0)
MCV: 94.3 fL (ref 80.0–100.0)
Platelets: 80 10*3/uL — ABNORMAL LOW (ref 150–400)
RBC: 3.7 MIL/uL — ABNORMAL LOW (ref 4.22–5.81)
RDW: 14.4 % (ref 11.5–15.5)
WBC: 10.3 10*3/uL (ref 4.0–10.5)
nRBC: 0 % (ref 0.0–0.2)

## 2020-04-01 MED ORDER — DOCUSATE SODIUM 100 MG PO CAPS
100.0000 mg | ORAL_CAPSULE | Freq: Two times a day (BID) | ORAL | Status: DC
  Administered 2020-04-01 (×2): 100 mg via ORAL
  Filled 2020-04-01 (×2): qty 1

## 2020-04-01 MED ORDER — POLYETHYLENE GLYCOL 3350 17 G PO PACK
17.0000 g | PACK | Freq: Every day | ORAL | Status: DC
  Administered 2020-04-01: 17 g via ORAL
  Filled 2020-04-01: qty 1

## 2020-04-01 NOTE — Progress Notes (Signed)
3 Days Post-Op     Subjective: Tolerating regular diet, no BM so far.  Ambulating in the halls.  Overall doing well.  Objective: Vital signs in last 24 hours: Temp:  [98.2 F (36.8 C)-99.4 F (37.4 C)] 98.6 F (37 C) (02/25 0451) Pulse Rate:  [77-92] 77 (02/25 0451) Resp:  [17-20] 17 (02/25 0451) BP: (114-143)/(68-79) 114/68 (02/25 0451) SpO2:  [97 %-99 %] 97 % (02/25 0451) Last BM Date: 03/28/20 1260 p.o. recorded 1200 urine No BM recorded T-max 99.4 vital signs are stable WBC 10.3 H/H 11.6/34.9 Platelets 80K  total bilirubin 2.2, albumin 3.0  Intake/Output from previous day: 02/24 0701 - 02/25 0700 In: 1260 [P.O.:1260] Out: 1200 [Urine:1200] Intake/Output this shift: No intake/output data recorded.  General appearance: alert, cooperative and no distress Resp: clear to auscultation bilaterally GI: Soft, sore, midline incision looks fine, tolerating p.o.'s no BM so far.  Lab Results:  Recent Labs    03/30/20 0506 04/01/20 0440  WBC 16.2* 10.3  HGB 13.5 11.6*  HCT 41.0 34.9*  PLT 87* 80*    BMET Recent Labs    03/30/20 0506 04/01/20 0440  NA 134* 133*  K 3.9 3.7  CL 103 102  CO2 22 23  GLUCOSE 110* 109*  BUN 19 19  CREATININE 0.95 1.00  CALCIUM 8.7* 8.5*   PT/INR Recent Labs    03/29/20 1424  LABPROT 14.2  INR 1.1    Recent Labs  Lab 03/29/20 0538 03/30/20 0506 04/01/20 0440  AST 42* 42* 29  ALT 25 20 18   ALKPHOS 79 64 52  BILITOT 1.2 2.0* 2.2*  PROT 8.0 7.3 6.4*  ALBUMIN 3.7 3.5 3.0*     Lipase     Component Value Date/Time   LIPASE 75 (H) 01/16/2020 2003     Medications:  methocarbamol  500 mg Oral QID   spironolactone  100 mg Oral Daily    Assessment/Plan Cirrhosis Ascites -1 L drained during surgery Hepatitis C Portal hypertension Hx substance abuse Thrombocytopenia  -Platelets 81K>>87K>>80K    Incarcerated umbilical hernia, bilateral inguinal hernias with necrotic segment of small bowel. Open umbilical  hernia repair, small bowel resection, 03/29/2020, Dr. Eddie Dibbles Stechschulte POD #2  -WBC 6.5>> 16.2>>10.3 FEN: Full liquid diet ID: Cefotetan preop DVT: SCDs; thrombocytopenia no chemical prophylaxis Follow-up: Dr. Thermon Leyland    Plan: Dr. Thermon Leyland he plans to do a bedside ultrasound to assess for ascites.  He is tolerated diet, consider discharge later today or tomorrow.  LOS: 2 days    Richard Yang 04/01/2020 Please see Amion

## 2020-04-01 NOTE — Progress Notes (Signed)
Stole a ultrasound from the ER and performed a bedside ultrasound.  He does have a re-accumulation of mild to moderate amount of ascites but probably not worth performing paracentesis at this time.  If his abdomen continues to become more distended would consider paracentesis to aid in umbilical hernia repair healing - told him to keep an eye on this at the time of discharge.  Awaiting for return of bowel function prior to discharge.  Possibly home tomorrow.  Continue diuretics.

## 2020-04-02 MED ORDER — DOCUSATE SODIUM 100 MG PO CAPS: 100.0000 mg | ORAL_CAPSULE | Freq: Two times a day (BID) | ORAL | 0 refills | Status: DC

## 2020-04-02 MED ORDER — OXYCODONE HCL 5 MG PO TABS: 5.0000 mg | ORAL_TABLET | ORAL | 0 refills | Status: DC | PRN

## 2020-04-02 MED ORDER — POLYETHYLENE GLYCOL 3350 17 G PO PACK: 17.0000 g | PACK | Freq: Every day | ORAL | 0 refills | Status: DC | PRN

## 2020-04-02 MED ORDER — BISACODYL 10 MG RE SUPP
10.0000 mg | Freq: Once | RECTAL | Status: DC
  Filled 2020-04-02: qty 1

## 2020-04-02 NOTE — Discharge Summary (Signed)
Patient ID: Mavrik Bynum [id number] 65 y.o. Mar 03, 1955  03/29/2020  Discharge date and time: 04/02/2020 11:30 AM  Admitting Physician: Louanna Raw  Discharge Physician: Rosario Adie  Admission Diagnoses: Incarcerated umbilical hernia [B34.1]  Discharge Diagnoses: same  Operations: Procedure(s): OPEN UMBILICAL HERNIA REPAIR, BOWEL RESECTION    Discharged Condition: good    Hospital Course: Pt presented to ED with incarcerated umbilical hernia.  He was taken to the OR for emergent surgery with repair of hernia and SBR.  He was admitted to the floor and diet advanced as tolerated.  By POD 4 he was tolerating a diet and PO pain meds.  He was ambulating without difficulty and having bowel function.  He was felt to be in stable condition for discharge.  Consults: None  Significant Diagnostic Studies: labs: cbc, cmet  Treatments: IV hydration, analgesia: Vicodin and surgery: see above  Disposition: Home

## 2020-04-02 NOTE — Progress Notes (Signed)
Nurse reviewed discharge instructions with pt.  Pt verbalized understanding of discharge instructions, follow up appointments and new medications.  No concerns at time of discharge. 

## 2020-04-02 NOTE — Progress Notes (Signed)
4 Days Post-Op     Subjective: Tolerating regular diet, having flatus.  Ambulating in the halls.  No BM yet  Objective: Vital signs in last 24 hours: Temp:  [97.8 F (36.6 C)-98.8 F (37.1 C)] 97.8 F (36.6 C) (02/26 0602) Pulse Rate:  [74-80] 79 (02/26 0602) Resp:  [18-20] 20 (02/26 0602) BP: (112-131)/(68-77) 112/68 (02/26 0602) SpO2:  [97 %-99 %] 98 % (02/26 0602) Last BM Date: 03/28/20   Intake/Output from previous day: 02/25 0701 - 02/26 0700 In: 720 [P.O.:720] Out: 900 [Urine:900] Intake/Output this shift: No intake/output data recorded.  General appearance: alert, cooperative and no distress Resp: clear to auscultation bilaterally GI: Soft, sore, midline incision looks fine, tolerating p.o.'s no BM so far.  Lab Results:  Recent Labs    04/01/20 0440  WBC 10.3  HGB 11.6*  HCT 34.9*  PLT 80*    BMET Recent Labs    04/01/20 0440  NA 133*  K 3.7  CL 102  CO2 23  GLUCOSE 109*  BUN 19  CREATININE 1.00  CALCIUM 8.5*   PT/INR No results for input(s): LABPROT, INR in the last 72 hours.  Recent Labs  Lab 03/29/20 0538 03/30/20 0506 04/01/20 0440  AST 42* 42* 29  ALT 25 20 18   ALKPHOS 79 64 52  BILITOT 1.2 2.0* 2.2*  PROT 8.0 7.3 6.4*  ALBUMIN 3.7 3.5 3.0*     Lipase     Component Value Date/Time   LIPASE 75 (H) 01/16/2020 2003     Medications: . bisacodyl  10 mg Rectal Once  . docusate sodium  100 mg Oral BID  . methocarbamol  500 mg Oral QID  . polyethylene glycol  17 g Oral Daily  . spironolactone  100 mg Oral Daily    Assessment/Plan Cirrhosis Ascites -1 L drained during surgery Hepatitis C Portal hypertension Hx substance abuse Thrombocytopenia  -Platelets 81K>>87K>>80K    Incarcerated umbilical hernia, bilateral inguinal hernias with necrotic segment of small bowel. Open umbilical hernia repair, small bowel resection, 03/29/2020, Dr. Eddie Dibbles Stechschulte POD #3  -WBC normalized FEN: reg diet ID: Cefotetan preop DVT:  SCDs; thrombocytopenia no chemical prophylaxis due to recent surgery and thrombocytopenia Follow-up: Dr. Thermon Leyland    Plan: will give suppository and d/c if he has a BM  LOS: 3 days    Rosario Adie 05/24/6220 Please see Amion

## 2020-04-04 ENCOUNTER — Telehealth: Payer: Self-pay

## 2020-04-04 NOTE — Telephone Encounter (Signed)
Transition Care Management Unsuccessful Follow-up Telephone Call  Date of discharge and from where:  04/02/2020, [facility]  Attempts:  1st Attempt  Reason for unsuccessful TCM follow-up call:  Left voice message - call back requested to this CM.  Need to discuss scheduling a follow up appointment with Dr Joya Gaskins

## 2020-04-04 NOTE — Telephone Encounter (Signed)
Transition Care Management Follow-up Telephone Call  Date of discharge and from where: 04/02/2020; [facility]   How have you been since you were released from the hospital? He said he is feeling good. Walking around, not in a lot of pain.  Trying not to take the oxycodone. Has been moving his bowels without any difficulty.   Any questions or concerns? No  Items Reviewed:  Did the pt receive and understand the discharge instructions provided? Yes   Medications obtained and verified? Yes  - he has all medications and said he did not have any questions about his med regime.   Other? No   Any new allergies since your discharge? No   Do you have support at home? Yes   Home Care and Equipment/Supplies: Were home health services ordered? no If so, what is the name of the agency? n/a  Has the agency set up a time to come to the patient's home? not applicable Were any new equipment or medical supplies ordered?  No What is the name of the medical supply agency? n/a Were you able to get the supplies/equipment? not applicable Do you have any questions related to the use of the equipment or supplies? No  Functional Questionnaire: (I = Independent and D = Dependent) ADLs: independent  Follow up appointments reviewed:   PCP Hospital f/u appt confirmed? Yes  - Dr Joya Gaskins 05/11/2020.  He had Nurse appointment for Hep A at [facility] 04/05/2020 but requested it be rescheduled to 04/13/2020.   [facility] f/u appt confirmed? Yes [facility] - 04/07/2020: GI- His colonoscopy needs to be rescheduled.       Are transportation arrangements needed? No   If their condition worsens, is the pt aware to call PCP or go to the Emergency Dept.? Yes  Was the patient provided with contact information for the PCP's office or ED? Yes  Was to pt encouraged to call back with questions or concerns? Yes

## 2020-04-05 ENCOUNTER — Ambulatory Visit: Payer: Medicaid Other

## 2020-04-07 ENCOUNTER — Other Ambulatory Visit: Payer: Self-pay

## 2020-04-07 ENCOUNTER — Ambulatory Visit (INDEPENDENT_AMBULATORY_CARE_PROVIDER_SITE_OTHER): Payer: Medicare Other | Admitting: Family

## 2020-04-07 ENCOUNTER — Encounter: Payer: Self-pay | Admitting: Family

## 2020-04-07 VITALS — BP 120/68 | HR 77 | Temp 97.8°F | Ht 69.0 in | Wt 188.0 lb

## 2020-04-07 DIAGNOSIS — B182 Chronic viral hepatitis C: Secondary | ICD-10-CM

## 2020-04-07 NOTE — Assessment & Plan Note (Signed)
Mr. Hayashida has completed 24 weeks of Epclusa for genotype 1 a chronic hepatitis C complicated by decompensated cirrhosis.  Previous lab work with viral load that was undetectable.  Check blood work today.  Discussed plan of care to include blood work today followed by repeat blood work in 3 months to confirm SVR 12.  Remains asymptomatic.  Plan for follow-up in 3 months or sooner if needed.

## 2020-04-07 NOTE — Patient Instructions (Signed)
Nice to see you.  We will check your lab work today.  Plan a lab visit in 3 months and then a telehealth/video visit for your cure visit about 2 weeks after.   Let us know if you have any questions.  Have a great day and stay safe!

## 2020-04-07 NOTE — Progress Notes (Signed)
Subjective:    Patient ID: Richard Yang, male    DOB: 1956/02/02, 65 y.o.   MRN: [id number]  Chief Complaint  Patient presents with  . Hepatitis C     HPI:  Richard Yang is a 65 y.o. male with chornic Hepatitis C complicated by decompensated cirrhosis last seen on 01/04/20 with undetectable viral load and good adherence and tolerance to Epclusa who presents today for end of treatment visit.   Richard Yang has been doing well since his last office visit.  He was recently seen for emergency hernia repair surgery following a fall.  He completed his Epclusa as prescribed with no adverse side effects or missed doses since starting medication. Overall feeling well today with no concerns/complaints. Remains asymptomatic.   No Known Allergies    Outpatient Medications Prior to Visit  Medication Sig Dispense Refill  . docusate sodium (COLACE) 100 MG capsule Take 1 capsule (100 mg total) by mouth 2 (two) times daily. 10 capsule 0  . oxyCODONE (OXY IR/ROXICODONE) 5 MG immediate release tablet Take 1 tablet (5 mg total) by mouth every 4 (four) hours as needed for moderate pain or severe pain. 15 tablet 0  . polyethylene glycol (MIRALAX / GLYCOLAX) 17 g packet Take 17 g by mouth daily as needed. 14 each 0  . spironolactone (ALDACTONE) 100 MG tablet Take 1 tablet (100 mg total) by mouth daily. 30 tablet 2  . Sofosbuvir-Velpatasvir (EPCLUSA) 400-100 MG TABS Take 1 tablet by mouth daily. Take 1 tablet by mouth daily. (Patient not taking: No sig reported) 28 tablet 5   No facility-administered medications prior to visit.     Past Medical History:  Diagnosis Date  . Ascites   . Cirrhosis ([place])   . Elevated AFP   . Hepatitis C   . Inguinal hernia   . Portal hypertension ([place])   . Substance abuse ([place])    alcohol abuse hx  . Umbilical hernia      Past Surgical History:  Procedure Laterality Date  . INGUINAL HERNIA REPAIR N/A 03/29/2020   Procedure: OPEN UMBILICAL HERNIA REPAIR,  BOWEL RESECTION;  Surgeon: Felicie Morn, MD;  Location: [facility];  Service: General;  Laterality: N/A;  . IR PARACENTESIS  09/16/2019  . IR PARACENTESIS  10/09/2019  . IR PARACENTESIS  12/09/2019  . NO PAST SURGERIES         Review of Systems  Constitutional: Negative for chills, diaphoresis, fatigue and fever.  Respiratory: Negative for cough, chest tightness, shortness of breath and wheezing.   Cardiovascular: Negative for chest pain.  Gastrointestinal: Negative for abdominal distention, abdominal pain, constipation, diarrhea, nausea and vomiting.  Neurological: Negative for weakness and headaches.  Hematological: Does not bruise/bleed easily.      Objective:    BP 120/68   Pulse 77   Temp 97.8 F (36.6 C) (Oral)   Ht 5\' 9"  (1.753 m)   Wt 188 lb (85.3 kg)   BMI 27.76 kg/m  Nursing note and vital signs reviewed.  Physical Exam Constitutional:      General: He is not in acute distress.    Appearance: He is well-developed.  Cardiovascular:     Rate and Rhythm: Normal rate and regular rhythm.     Pulses: Intact distal pulses.     Heart sounds: Normal heart sounds. No murmur heard. No friction rub. No gallop.   Pulmonary:     Effort: Pulmonary effort is normal. No respiratory distress.     Breath  sounds: Normal breath sounds. No wheezing or rales.  Chest:     Chest wall: No tenderness.  Abdominal:     General: Bowel sounds are normal. There is no distension or ascites.     Palpations: Abdomen is soft. There is no hepatosplenomegaly or mass.     Tenderness: There is no abdominal tenderness. There is no guarding or rebound.  Skin:    General: Skin is warm and dry.  Neurological:     Mental Status: He is alert and oriented to person, place, and time.  Psychiatric:        Mood and Affect: Mood and affect normal.        Behavior: Behavior normal.        Thought Content: Thought content normal.        Judgment: Judgment normal.      Depression screen [facility] 2/9  01/05/2020 11/04/2019 08/06/2019 06/24/2019 12/16/2015  Decreased Interest 0 0 0 0 1  Down, Depressed, Hopeless 0 0 0 0 1  PHQ - 2 Score 0 0 0 0 2  Altered sleeping 0 - 0 1 2  Tired, decreased energy 0 - 0 0 0  Change in appetite 0 - 0 0 1  Feeling bad or failure about yourself  0 - 0 0 0  Trouble concentrating 0 - 0 0 1  Moving slowly or fidgety/restless 0 - 0 0 0  Suicidal thoughts 0 - 0 0 0  PHQ-9 Score 0 - 0 1 6       Assessment & Plan:    Patient Active Problem List   Diagnosis Date Noted  . Incarcerated umbilical hernia 94/85/4627  . Other cirrhosis of liver ([place]) 11/16/2019  . Ascites of liver 11/16/2019  . Elevated AFP 11/16/2019  . Alcohol use disorder, moderate, in early remission, dependence ([place]) 11/16/2019  . Chronic hepatitis C without hepatic coma ([place]) 09/23/2019  . Portal hypertension ([place]) 08/06/2019  . Thrombocytopenia ([place]) 08/06/2019  . Umbilical hernia 03/50/0938  . Inguinal hernia 07/13/2019  . Aortic atherosclerosis ([place]) 07/13/2019  . History of alcohol use 07/13/2019  . Liver cirrhosis, alcoholic ([place]) 18/29/9371  . Hypertension 06/24/2019     Problem List Items Addressed This Visit      Digestive   Chronic hepatitis C without hepatic coma ([place]) - Primary    Richard Yang has completed 24 weeks of Epclusa for genotype 1 a chronic hepatitis C complicated by decompensated cirrhosis.  Previous lab work with viral load that was undetectable.  Check blood work today.  Discussed plan of care to include blood work today followed by repeat blood work in 3 months to confirm SVR 12.  Remains asymptomatic.  Plan for follow-up in 3 months or sooner if needed.      Relevant Orders   Hepatitis C RNA quantitative   Hepatitis C RNA quantitative       I have discontinued Salome Arnt. Manard's Sofosbuvir-Velpatasvir. I am also having him maintain his spironolactone, docusate sodium, polyethylene glycol, and oxyCODONE.   Follow-up: Return in about 3 months (around  07/08/2020), or if symptoms worsen or fail to improve.   Terri Piedra, MSN, FNP-C Nurse Practitioner [facility] for Infectious Disease [place] number: [phone]

## 2020-04-09 LAB — HEPATITIS C RNA QUANTITATIVE
HCV Quantitative Log: <1.18 log IU/mL
HCV RNA, PCR, QN: <15 IU/mL

## 2020-04-11 ENCOUNTER — Telehealth: Payer: Self-pay

## 2020-04-11 NOTE — Telephone Encounter (Signed)
Spoke with patient to relay per Terri Piedra, NP that his Hepatitis C viral load is undetectable and that we will plan to recheck again in 3 months. Patient very appreciative of call, verbalized understanding, and has no further questions.   Beryle Flock, RN

## 2020-04-11 NOTE — Telephone Encounter (Signed)
-----   Message from [place], [place] sent at 04/11/2020  3:00 PM EST ----- Please inform Richard Yang that his Hepatitis C viral load is undetected and will plan to recheck in 3 months or sooner if needed.

## 2020-04-12 ENCOUNTER — Encounter: Payer: Medicare Other | Admitting: Gastroenterology

## 2020-04-12 ENCOUNTER — Telehealth: Payer: Self-pay | Admitting: Gastroenterology

## 2020-04-12 NOTE — Telephone Encounter (Signed)
Understandable with recent hernia operation. He has need for EGD due to his underlying liver disease to rule out varices and needs colon cancer screening. Clinic visit with NP Kennedy-Smith in 6-8 weeks would be reasonable to see how things are going and get rescheduled. Please place a recall in system for 4 months from now incase he hasn't rescheduled sooner. Thanks. GM

## 2020-04-12 NOTE — Telephone Encounter (Signed)
Recall has been entered.  Joey can you please call and get appt scheduled for appt with Colleen in 6-8 weeks?

## 2020-04-12 NOTE — Telephone Encounter (Signed)
Pt missed his colonoscopy appt that was scheduled for today 3/8 since he recently had hernia surgery and was advised his colonoscopy would be rescheduled. Pt does not wish to reschedule an appt at this time.

## 2020-04-13 ENCOUNTER — Other Ambulatory Visit: Payer: Self-pay

## 2020-04-13 ENCOUNTER — Ambulatory Visit: Payer: Medicare Other

## 2020-04-25 MED FILL — SPIRONOLACTONE 100 MG TAB: 100 | 30 days supply | Qty: 30 | Fill #2

## 2020-05-03 ENCOUNTER — Ambulatory Visit: Payer: Medicare Other

## 2020-05-07 ENCOUNTER — Other Ambulatory Visit: Payer: Self-pay

## 2020-05-11 ENCOUNTER — Other Ambulatory Visit: Payer: Self-pay

## 2020-05-11 ENCOUNTER — Encounter: Payer: Self-pay | Admitting: Critical Care Medicine

## 2020-05-11 ENCOUNTER — Ambulatory Visit: Payer: Medicare Other | Attending: Critical Care Medicine | Admitting: Critical Care Medicine

## 2020-05-11 VITALS — BP 125/73 | HR 59 | Resp 16 | Wt 199.2 lb

## 2020-05-11 DIAGNOSIS — F1021 Alcohol dependence, in remission: Secondary | ICD-10-CM | POA: Diagnosis not present

## 2020-05-11 DIAGNOSIS — K766 Portal hypertension: Secondary | ICD-10-CM | POA: Insufficient documentation

## 2020-05-11 DIAGNOSIS — I700 Atherosclerosis of aorta: Secondary | ICD-10-CM | POA: Insufficient documentation

## 2020-05-11 DIAGNOSIS — D696 Thrombocytopenia, unspecified: Secondary | ICD-10-CM | POA: Diagnosis not present

## 2020-05-11 DIAGNOSIS — Z79899 Other long term (current) drug therapy: Secondary | ICD-10-CM | POA: Diagnosis not present

## 2020-05-11 DIAGNOSIS — I10 Essential (primary) hypertension: Secondary | ICD-10-CM | POA: Insufficient documentation

## 2020-05-11 DIAGNOSIS — K7030 Alcoholic cirrhosis of liver without ascites: Secondary | ICD-10-CM

## 2020-05-11 DIAGNOSIS — R600 Localized edema: Secondary | ICD-10-CM | POA: Insufficient documentation

## 2020-05-11 DIAGNOSIS — B182 Chronic viral hepatitis C: Secondary | ICD-10-CM | POA: Diagnosis not present

## 2020-05-11 DIAGNOSIS — K7031 Alcoholic cirrhosis of liver with ascites: Secondary | ICD-10-CM | POA: Diagnosis not present

## 2020-05-11 DIAGNOSIS — K429 Umbilical hernia without obstruction or gangrene: Secondary | ICD-10-CM | POA: Insufficient documentation

## 2020-05-11 MED ORDER — ATORVASTATIN CALCIUM 20 MG PO TABS
20.0000 mg | ORAL_TABLET | Freq: Every day | ORAL | 3 refills | Status: DC
  Filled 2020-05-11: qty 90, 90d supply, fill #0
  Filled 2020-08-10: qty 90, 90d supply, fill #1

## 2020-05-11 MED ORDER — SPIRONOLACTONE 100 MG PO TABS
ORAL_TABLET | Freq: Every day | ORAL | 2 refills | Status: DC
Start: 2020-05-11 — End: 2020-05-11
  Filled 2020-05-11: qty 30, fill #0

## 2020-05-11 MED ORDER — SPIRONOLACTONE 100 MG PO TABS
ORAL_TABLET | Freq: Every day | ORAL | 2 refills | Status: DC
Start: 2020-05-11 — End: 2020-10-19
  Filled 2020-05-11: qty 90, 90d supply, fill #0
  Filled 2020-05-24: qty 30, 30d supply, fill #0
  Filled 2020-06-28: qty 30, 30d supply, fill #1
  Filled 2020-07-30: qty 30, 30d supply, fill #2
  Filled 2020-09-05: qty 30, 30d supply, fill #3
  Filled 2020-10-02: qty 30, 30d supply, fill #4

## 2020-05-11 NOTE — Assessment & Plan Note (Signed)
Stable at this time 

## 2020-05-11 NOTE — Assessment & Plan Note (Signed)
Resume Lipitor

## 2020-05-11 NOTE — Patient Instructions (Signed)
Resume Lipitor atorvastatin take 1 daily this was sent to our pharmacy  Refills on Aldactone sent to our pharmacy  You can get MiraLAX and Colace over-the-counter  Labs today as a metabolic panel  Keep your appointment for colonoscopy with gastroenterology  Keep your follow-up appointments with the hepatitis clinic  Please obtain a Materna booster shot below is a number he can call we can get a appointment or there is a website you can go to  COVID-19 Vaccine Information can be found at: [email or web address] For questions related to vaccine distribution or appointments, please email [email or web address] or call [phone].

## 2020-05-11 NOTE — Assessment & Plan Note (Signed)
Chronic hepatitis C without hepatic coma stable now off antiviral

## 2020-05-11 NOTE — Progress Notes (Signed)
Subjective:    Patient ID: Richard Yang, male    DOB: 12/23/1955, 65 y.o.   MRN: [id number] History  65 y.o.M  Hx of ETOH cirrohsis, HTN to establish PCP care   07/13/19 This is a telephone visit follow-up from a May visit with primary care. The patient has history of alcohol induced cirrhotic changes based on emergency room visit in May.  Patient also has hypertension as well.  At the last visit the patient was given Aldactone and Lasix and he has a home blood pressure cuff.  He states his blood pressure now is anywhere from 120/76-130/78.  The patient states the edema in the lower extremities and abdomen are better.  He states he did have side effects on Aldactone is taking furosemide only at this time.  He is no longer been drinking alcohol since Mother's Day in May of this year.  His abdominal girth is gone down.  He does not weigh himself we do not know what his weight actually is.  The patient is thinking about getting a Covid vaccine but is yet to achieve this.  08/06/2019 Patient is seen in return follow-up and this is a face-to-face visit from the last visit which was a telephone visit.  Patient is here to establish further for primary care and follow-up for alcoholic liver cirrhosis with portal hypertension.  The patient does have moderate ascites with umbilical hernia.  Patient also has history of gallstones asymptomatic and atherosclerosis of the aorta from abdominal CT scan done in early May 2021 with an emergency room visit.  Patient initially was placed on Aldactone and furosemide but he had side effects with the Aldactone and had to stop this medication.  He is on furosemide only at 20 mg daily this is his only medication.  Note he does have increased urination but has lost 15 pounds of weight since May and has had reduction in the tense nature of the ascites of the abdomen.  Note there is documentation of prior history of thrombocytopenia and this was documented in May.  Has not yet  been seen by gastroenterology does not have any health insurance.  He has received the first of his during a Covid vaccine series and we have held off on Pneumovax and tetanus until the Covid vaccine series is completed.  His next vaccine is due next Monday. Patient does have hypertension with this and is only on the furosemide and yet with this persistent his hypertensive numbers.  Patient is also due up a hepatitis C screen and colonoscopy  Patient has note some constipation as well.  Patient denies any bleeding from his lower or upper GI tracts.  Patient states the edema in his lower extremities are improved however he still has scrotal edema  Wt Readings from Last 3 Encounters: 08/06/19 : 225 lb (102.1 kg) 06/24/19 : 240 lb (108.9 kg) 09/22/17 : 198 lb (89.8 kg)  09/17/2019 Patient is seen today in follow-up and note this patient has been seen by gastroenterology and they recommended paracentesis which occurred yesterday. He had 5 L of fluid removed. Note hepatitis C antibody is positive and his RNA quantitative did not run correctly therefore we are going to redraw this today  The patient states that his swelling is less since being on the diuretic and since having had the paracentesis. Note his hepatitis a and B studies are negative he therefore he needs a hepatitis A and B vaccine we only have B vaccine at this clinic  today we'll give this to him at this visit. The patient has normal INR liver functions have shown improvement electrolytes are stable he is on the furosemide and atorvastatin but is off the nadolol because of side effects  The patient has follow-up visits with gastroenterology who wished to perform upper endoscopy and colonoscopy also if the hepatitis C RNA quantitative is elevated he will need referral to our [facility] for hepatitis C treatment  The patient stated he did drink 1 glass of alcohol since the last visit I told him he cannot have any alcohol whatsoever and we will connect  him today with our licensed clinical social worker to connect him to alcohol treatment Just went for paracentesis recently.      11/04/2019 Since the last visit the patient is doing well.  He has started an antiviral for hepatitis C.  The patient has had his counseling performed as well for alcohol use and is no longer obtaining alcohol.  He now has Medicaid.  The patient has an upper endoscopy scheduled he did also needs a colonoscopy for cancer screening.  The patient is receiving his second hepatitis B vaccine today and needs his hepatitis A vaccine today.  Patient is also due a Tdap flu shot and Pneumovax 23 valent however these cannot all be given today with his other vaccines note he is already fully vaccinated for Covid and does not need his booster until January based on his last date of vaccination occurred in July  There are no other new complaints.  He had questions about whether he could use elderberry and emergency supplement and I told him he can stay on those vitamins  01/05/2020 Patient is seen today in return follow-up he had gone to the emergency room because of a umbilical hernia with bowel that was entrapped.  Patient has history of cirrhotic liver disease on the basis of hepatitis C and is currently under treatment with hepatitis C therapy.  Patient is also receiving treatment for his portal hypertension with Aldactone and furosemide.  The patient had a recent paracentesis per gastroenterology and the patient states following that that is when his umbilical hernia worsened.  He also has difficulty with worsening of a right inguinal hernia with now scrotal edema that is developed.  The patient notes no other complaints at this time The patient has finished his course of hepatitis B vaccines Patient is now due for flu shot Tdap and Pneumovax  Patient is also due a colonoscopy  05/11/2020 Patient seen in return follow-up and since the last visit in November he unfortunately had  incarceration of his umbilical hernia had to have emergency surgery in February of this year.  I try to get him into general surgery but was not able to achieve this but he was underinsured at that time.  Now he has Medicaid and Medicare double insured.  Patient states his abdomen is much improved he is not having any significant nausea or vomiting bowels are moving normally at this time he is off all pain medicine.  Patient also here in follow-up for his aortic atherosclerosis portal hypertension liver cirrhosis alcoholic with associated chronic hepatitis C ascites of the liver and hypertension.  He is compliant with his Aldactone.  He is now off his antiviral for hepatitis C and his viral quantity is 0 at this time for hepatitis C  Patient has minimal edema.  He is eating well.  On arrival blood pressure was 125/73.  He does have an upcoming colonoscopy  for screening for colon cancer that is pending.  The patient does not smoke cigarettes at this time.  He is aware he needs a COVID booster.   Past Medical History:  Diagnosis Date  . Alcohol use disorder, moderate, in early remission, dependence ([place]) 11/16/2019  . Ascites   . Cirrhosis ([place])   . Elevated AFP   . Hepatitis C   . Incarcerated umbilical hernia 1/70/0174  . Inguinal hernia   . Portal hypertension ([place])   . Substance abuse ([place])    alcohol abuse hx  . Umbilical hernia      Family History  Problem Relation Age of Onset  . Stomach cancer Neg Hx   . Colon cancer Neg Hx   . Esophageal cancer Neg Hx   . Pancreatic cancer Neg Hx      Social History   Socioeconomic History  . Marital status: Single    Spouse name: Not on file  . Number of children: Not on file  . Years of education: Not on file  . Highest education level: Not on file  Occupational History  . Not on file  Tobacco Use  . Smoking status: Never Smoker  . Smokeless tobacco: Never Used  Vaping Use  . Vaping Use: Never used  Substance and Sexual Activity   . Alcohol use: Not Currently    Comment: sober since May  . Drug use: Not Currently  . Sexual activity: Not Currently    Partners: Male  Other Topics Concern  . Not on file  Social History Narrative  . Not on file   Social Determinants of Health   Financial Resource Strain: Not on file  Food Insecurity: Not on file  Transportation Needs: Not on file  Physical Activity: Not on file  Stress: Not on file  Social Connections: Not on file  Intimate Partner Violence: Not on file     No Known Allergies   Outpatient Medications Prior to Visit  Medication Sig Dispense Refill  . spironolactone (ALDACTONE) 100 MG tablet TAKE 1 TABLET (100 MG TOTAL) BY MOUTH DAILY. 30 tablet 2  . docusate sodium (COLACE) 100 MG capsule Take 1 capsule (100 mg total) by mouth 2 (two) times daily. (Patient not taking: Reported on 05/11/2020) 10 capsule 0  . polyethylene glycol (MIRALAX / GLYCOLAX) 17 g packet Take 17 g by mouth daily as needed. (Patient not taking: Reported on 05/11/2020) 14 each 0  . oxyCODONE (OXY IR/ROXICODONE) 5 MG immediate release tablet Take 1 tablet (5 mg total) by mouth every 4 (four) hours as needed for moderate pain or severe pain. (Patient not taking: Reported on 05/11/2020) 15 tablet 0   No facility-administered medications prior to visit.    Review of Systems  Constitutional: Negative for activity change and fatigue.  HENT: Negative.   Respiratory: Negative.   Cardiovascular: Negative for chest pain, palpitations and leg swelling.  Gastrointestinal: Negative for abdominal distention, anal bleeding, blood in stool and constipation.  Endocrine: Negative for polyuria.  Genitourinary: Negative for enuresis, frequency, hematuria and scrotal swelling.  Musculoskeletal: Negative.   Neurological: Negative.   Psychiatric/Behavioral: Negative.        Objective:   Physical Exam Vitals:   05/11/20 0844  BP: 125/73  Pulse: (!) 59  Resp: 16  SpO2: 99%  Weight: 199 lb 3.2 oz (90.4  kg)    Gen: Pleasant, well-nourished, in no distress,  normal affect  ENT: No lesions,  mouth clear,  oropharynx clear, no postnasal drip  Neck:  No JVD, no TMG, no carotid bruits  Lungs: No use of accessory muscles, no dullness to percussion, clear without rales or rhonchi  Cardiovascular: RRR, heart sounds normal, no murmur or gallops, 1+ peripheral edema  Abdomen: Umbilical hernia is resolved and ascites has resolved as well abdomen nontender  Musculoskeletal: No deformities, no cyanosis or clubbing  Neuro: alert, non focal  Skin: Warm, no lesions or rashes  Hepatic Function Latest Ref Rng & Units 04/01/2020 03/30/2020 03/29/2020  Total Protein 6.5 - 8.1 g/dL 6.4(L) 7.3 8.0  Albumin 3.5 - 5.0 g/dL 3.0(L) 3.5 3.7  AST 15 - 41 U/L 29 42(H) 42(H)  ALT 0 - 44 U/L _0 Alk Phosphatase 38 - 126 U/L 52 64 79  Total Bilirubin 0.3 - 1.2 mg/dL 2.2(H) 2.0(H) 1.2  Bilirubin, Direct 0.0 - 0.3 mg/dL - - -   BMP Latest Ref Rng & Units 04/01/2020 03/30/2020 03/29/2020  Glucose 70 - 99 mg/dL 109(H) 110(H) 131(H)  BUN 8 - 23 mg/dL _1 Creatinine 0.61 - 1.24 mg/dL 1.00 0.95 0.77  BUN/Creat Ratio 6 - 22 (calc) - - -  Sodium 135 - 145 mmol/L 133(L) 134(L) 132(L)  Potassium 3.5 - 5.1 mmol/L 3.7 3.9 4.0  Chloride 98 - 111 mmol/L 102 103 104  CO2 22 - 32 mmol/L 23 22 19(L)  Calcium 8.9 - 10.3 mg/dL 8.5(L) 8.7(L) 9.3   CBC Latest Ref Rng & Units 04/01/2020 03/30/2020 03/29/2020  WBC 4.0 - 10.5 K/uL 10.3 16.2(H) 6.5  Hemoglobin 13.0 - 17.0 g/dL 11.6(L) 13.5 13.4  Hematocrit 39.0 - 52.0 % 34.9(L) 41.0 39.5  Platelets 150 - 400 K/uL 80(L) 87(L) 81(L)         Assessment & Plan:  I personally reviewed all images and lab data in the [facility] system as well as any outside material available during this office visit and agree with the  radiology impressions.   Hypertension Hypertension well controlled only on Aldactone will monitor  Portal hypertension (HCC) Stable at this time  Aortic  atherosclerosis (HCC) Resume Lipitor  Liver cirrhosis, alcoholic (HCC) Cirrhosis of the liver is stable continue Aldactone no longer drinking alcohol  Chronic hepatitis C without hepatic coma (HCC) Chronic hepatitis C without hepatic coma stable now off antiviral  Umbilical hernia Resolved after incarceration of the hernia  Thrombocytopenia (HCC) Platelet count 80,000 range we will monitor due to liver disease   Delson was seen today for hypertension.  Diagnoses and all orders for this visit:  Primary hypertension -     Basic Metabolic Panel  Portal hypertension (HCC) -     Basic Metabolic Panel  Alcohol use disorder, moderate, in early remission, dependence (HCC)  Aortic atherosclerosis (HCC)  Thrombocytopenia (HCC)  Alcoholic cirrhosis of liver without ascites (HCC)  Chronic hepatitis C without hepatic coma (HCC)  Umbilical hernia without obstruction and without gangrene  Other orders -     atorvastatin (LIPITOR) 20 MG tablet; Take 1 tablet (20 mg total) by mouth daily. -     Discontinue: spironolactone (ALDACTONE) 100 MG tablet; TAKE 1 TABLET (100 MG TOTAL) BY MOUTH DAILY. -     spironolactone (ALDACTONE) 100 MG tablet; TAKE 1 TABLET (100 MG TOTAL) BY MOUTH DAILY.   Patient knows to keep follow-up appointments with gastroenterology for colonoscopy  I spent 48 minutes going over the patient's chart directly speaking to the patient examining him reviewing all records reviewing recent hospitalization in February for the umbilical hernia repair and complex medical  decision making was high given high risk of death due to liver disease

## 2020-05-11 NOTE — Assessment & Plan Note (Signed)
Cirrhosis of the liver is stable continue Aldactone no longer drinking alcohol

## 2020-05-11 NOTE — Assessment & Plan Note (Signed)
Platelet count 80,000 range we will monitor due to liver disease

## 2020-05-11 NOTE — Assessment & Plan Note (Signed)
Hypertension well controlled only on Aldactone will monitor

## 2020-05-11 NOTE — Assessment & Plan Note (Signed)
Resolved after incarceration of the hernia

## 2020-05-12 ENCOUNTER — Telehealth: Payer: Self-pay

## 2020-05-12 LAB — BASIC METABOLIC PANEL
BUN/Creatinine Ratio: 16 (ref 10–24)
BUN: 17 mg/dL (ref 8–27)
CO2: 21 mmol/L (ref 20–29)
Calcium: 8.8 mg/dL (ref 8.6–10.2)
Chloride: 103 mmol/L (ref 96–106)
Creatinine, Ser: 1.09 mg/dL (ref 0.76–1.27)
Glucose: 96 mg/dL (ref 65–99)
Potassium: 4.1 mmol/L (ref 3.5–5.2)
Sodium: 139 mmol/L (ref 134–144)
eGFR: 75 mL/min/{1.73_m2} (ref >59)

## 2020-05-12 NOTE — Telephone Encounter (Signed)
Contacted pt to go over lab results pt is aware and doesn't have any questions or concerns 

## 2020-05-13 ENCOUNTER — Other Ambulatory Visit: Payer: Self-pay

## 2020-05-24 ENCOUNTER — Other Ambulatory Visit: Payer: Self-pay

## 2020-05-25 ENCOUNTER — Other Ambulatory Visit: Payer: Self-pay

## 2020-06-03 ENCOUNTER — Other Ambulatory Visit (INDEPENDENT_AMBULATORY_CARE_PROVIDER_SITE_OTHER): Payer: Medicare Other

## 2020-06-03 ENCOUNTER — Ambulatory Visit (INDEPENDENT_AMBULATORY_CARE_PROVIDER_SITE_OTHER): Payer: Medicare Other | Admitting: Nurse Practitioner

## 2020-06-03 ENCOUNTER — Encounter: Payer: Self-pay | Admitting: Nurse Practitioner

## 2020-06-03 ENCOUNTER — Other Ambulatory Visit: Payer: Self-pay

## 2020-06-03 VITALS — BP 142/72 | HR 72 | Ht 69.0 in | Wt 196.0 lb

## 2020-06-03 DIAGNOSIS — Z1211 Encounter for screening for malignant neoplasm of colon: Secondary | ICD-10-CM

## 2020-06-03 DIAGNOSIS — K7030 Alcoholic cirrhosis of liver without ascites: Secondary | ICD-10-CM | POA: Diagnosis not present

## 2020-06-03 LAB — COMPREHENSIVE METABOLIC PANEL
ALT: 17 U/L (ref 0–53)
AST: 28 U/L (ref 0–37)
Albumin: 3.9 g/dL (ref 3.5–5.2)
Alkaline Phosphatase: 88 U/L (ref 39–117)
BUN: 23 mg/dL (ref 6–23)
CO2: 25 mEq/L (ref 19–32)
Calcium: 9.5 mg/dL (ref 8.4–10.5)
Chloride: 104 mEq/L (ref 96–112)
Creatinine, Ser: 1.04 mg/dL (ref 0.40–1.50)
GFR: 75.14 mL/min (ref >60.00)
Glucose, Bld: 91 mg/dL (ref 70–99)
Potassium: 3.7 mEq/L (ref 3.5–5.1)
Sodium: 136 mEq/L (ref 135–145)
Total Bilirubin: 1 mg/dL (ref 0.2–1.2)
Total Protein: 7.9 g/dL (ref 6.0–8.3)

## 2020-06-03 LAB — CBC WITH DIFFERENTIAL/PLATELET
Basophils Absolute: 0 10*3/uL (ref 0.0–0.1)
Basophils Relative: 0.3 % (ref 0.0–3.0)
Eosinophils Absolute: 0.1 10*3/uL (ref 0.0–0.7)
Eosinophils Relative: 1.8 % (ref 0.0–5.0)
HCT: 40.1 % (ref 39.0–52.0)
Hemoglobin: 13.2 g/dL (ref 13.0–17.0)
Lymphocytes Relative: 42.2 % (ref 12.0–46.0)
Lymphs Abs: 1.9 10*3/uL (ref 0.7–4.0)
MCHC: 32.9 g/dL (ref 30.0–36.0)
MCV: 94.1 fl (ref 78.0–100.0)
Monocytes Absolute: 0.4 10*3/uL (ref 0.1–1.0)
Monocytes Relative: 8.7 % (ref 3.0–12.0)
Neutro Abs: 2.1 10*3/uL (ref 1.4–7.7)
Neutrophils Relative %: 47 % (ref 43.0–77.0)
Platelets: 81 10*3/uL — ABNORMAL LOW (ref 150.0–400.0)
RBC: 4.26 Mil/uL (ref 4.22–5.81)
RDW: 15.1 % (ref 11.5–15.5)
WBC: 4.4 10*3/uL (ref 4.0–10.5)

## 2020-06-03 LAB — PROTIME-INR
INR: 1.1 ratio — ABNORMAL HIGH (ref 0.8–1.0)
Prothrombin Time: 12.4 s (ref 9.6–13.1)

## 2020-06-03 MED ORDER — SUPREP BOWEL PREP KIT 17.5-3.13-1.6 GM/177ML PO SOLN
1.0000 | ORAL | 0 refills | Status: DC
  Filled 2020-06-03: qty 354, 1d supply, fill #0

## 2020-06-03 NOTE — Patient Instructions (Signed)
If you are age 66 or older, your body mass index should be between 23-30. Your Body mass index is 28.94 kg/m. If this is out of the aforementioned range listed, please consider follow up with your Primary Care Provider.  If you are age 71 or younger, your body mass index should be between 19-25. Your Body mass index is 28.94 kg/m. If this is out of the aformentioned range listed, please consider follow up with your Primary Care Provider.   PROCEDURES: You have been scheduled for an endoscopy and colonoscopy. Please follow the written instructions given to you at your visit today. Please pick up your prep supplies at the pharmacy within the next 1-3 days. If you use inhalers (even only as needed), please bring them with you on the day of your procedure.  LABS:  Lab work has been ordered for you today. Our lab is located in the basement. Press "B" on the elevator. The lab is located at the first door on the left as you exit the elevator.  HEALTHCARE LAWS AND MY CHART RESULTS: Due to recent changes in healthcare laws, you may see the results of your imaging and laboratory studies on MyChart before your provider has had a chance to review them.   We understand that in some cases there may be results that are confusing or concerning to you. Not all laboratory results come back in the same time frame and the provider may be waiting for multiple results in order to interpret others.  Please give [place] 48 hours in order for your provider to thoroughly review all the results before contacting the office for clarification of your results.   We have scheduled you a follow up with Dr. Rush Landmark on 08/24/20 at 11:30am.  Please follow a low sodium diet. It was great seeing you today! Thank you for entrusting me with your care and choosing [facility].  Noralyn Pick, CRNP   Low-Sodium Eating Plan Sodium, which is an element that makes up salt, helps you maintain a healthy balance of  fluids in your body. Too much sodium can increase your blood pressure and cause fluid and waste to be held in your body. Your health care provider or dietitian may recommend following this plan if you have high blood pressure (hypertension), kidney disease, liver disease, or heart failure. Eating less sodium can help lower your blood pressure, reduce swelling, and protect your heart, liver, and kidneys. What are tips for following this plan? Reading food labels  The Nutrition Facts label lists the amount of sodium in one serving of the food. If you eat more than one serving, you must multiply the listed amount of sodium by the number of servings.  Choose foods with less than 140 mg of sodium per serving.  Avoid foods with 300 mg of sodium or more per serving. Shopping  Look for lower-sodium products, often labeled as "low-sodium" or "no salt added."  Always check the sodium content, even if foods are labeled as "unsalted" or "no salt added."  Buy fresh foods. ? Avoid canned foods and pre-made or frozen meals. ? Avoid canned, cured, or processed meats.  Buy breads that have less than 80 mg of sodium per slice.   Cooking  Eat more home-cooked food and less restaurant, buffet, and fast food.  Avoid adding salt when cooking. Use salt-free seasonings or herbs instead of table salt or sea salt. Check with your health care provider or pharmacist before using salt substitutes.  Lacinda Axon  with plant-based oils, such as canola, sunflower, or olive oil.   Meal planning  When eating at a restaurant, ask that your food be prepared with less salt or no salt, if possible. Avoid dishes labeled as brined, pickled, cured, smoked, or made with soy sauce, miso, or teriyaki sauce.  Avoid foods that contain MSG (monosodium glutamate). MSG is sometimes added to [place] food, bouillon, and some canned foods.  Make meals that can be grilled, baked, poached, roasted, or steamed. These are generally made with less  sodium. General information Most people on this plan should limit their sodium intake to 1,500-2,000 mg (milligrams) of sodium each day. What foods should I eat? Fruits Fresh, frozen, or canned fruit. Fruit juice. Vegetables Fresh or frozen vegetables. "No salt added" canned vegetables. "No salt added" tomato sauce and paste. Low-sodium or reduced-sodium tomato and vegetable juice. Grains Low-sodium cereals, including oats, puffed wheat and rice, and shredded wheat. Low-sodium crackers. Unsalted rice. Unsalted pasta. Low-sodium bread. Whole-grain breads and whole-grain pasta. Meats and other proteins Fresh or frozen (no salt added) meat, poultry, seafood, and fish. Low-sodium canned tuna and salmon. Unsalted nuts. Dried peas, beans, and lentils without added salt. Unsalted canned beans. Eggs. Unsalted nut butters. Dairy Milk. Soy milk. Cheese that is naturally low in sodium, such as ricotta cheese, fresh mozzarella, or Swiss cheese. Low-sodium or reduced-sodium cheese. Cream cheese. Yogurt. Seasonings and condiments Fresh and dried herbs and spices. Salt-free seasonings. Low-sodium mustard and ketchup. Sodium-free salad dressing. Sodium-free light mayonnaise. Fresh or refrigerated horseradish. Lemon juice. Vinegar. Other foods Homemade, reduced-sodium, or low-sodium soups. Unsalted popcorn and pretzels. Low-salt or salt-free chips. The items listed above may not be a complete list of foods and beverages you can eat. Contact a dietitian for more information. What foods should I avoid? Vegetables Sauerkraut, pickled vegetables, and relishes. Olives. [place] fries. Onion rings. Regular canned vegetables (not low-sodium or reduced-sodium). Regular canned tomato sauce and paste (not low-sodium or reduced-sodium). Regular tomato and vegetable juice (not low-sodium or reduced-sodium). Frozen vegetables in sauces. Grains Instant hot cereals. Bread stuffing, pancake, and biscuit mixes. Croutons.  Seasoned rice or pasta mixes. Noodle soup cups. Boxed or frozen macaroni and cheese. Regular salted crackers. Self-rising flour. Meats and other proteins Meat or fish that is salted, canned, smoked, spiced, or pickled. Precooked or cured meat, such as sausages or meat loaves. Berniece Salines. Ham. Pepperoni. Hot dogs. Corned beef. Chipped beef. Salt pork. Jerky. Pickled herring. Anchovies and sardines. Regular canned tuna. Salted nuts. Dairy Processed cheese and cheese spreads. Hard cheeses. Cheese curds. Blue cheese. Feta cheese. String cheese. Regular cottage cheese. Buttermilk. Canned milk. Fats and oils Salted butter. Regular margarine. Ghee. Bacon fat. Seasonings and condiments Onion salt, garlic salt, seasoned salt, table salt, and sea salt. Canned and packaged gravies. Worcestershire sauce. Tartar sauce. Barbecue sauce. Teriyaki sauce. Soy sauce, including reduced-sodium. Steak sauce. Fish sauce. Oyster sauce. Cocktail sauce. Horseradish that you find on the shelf. Regular ketchup and mustard. Meat flavorings and tenderizers. Bouillon cubes. Hot sauce. Pre-made or packaged marinades. Pre-made or packaged taco seasonings. Relishes. Regular salad dressings. Salsa. Other foods Salted popcorn and pretzels. Corn chips and puffs. Potato and tortilla chips. Canned or dried soups. Pizza. Frozen entrees and pot pies. The items listed above may not be a complete list of foods and beverages you should avoid. Contact a dietitian for more information. Summary  Eating less sodium can help lower your blood pressure, reduce swelling, and protect your heart, liver, and kidneys.  Most people  on this plan should limit their sodium intake to 1,500-2,000 mg (milligrams) of sodium each day.  Canned, boxed, and frozen foods are high in sodium. Restaurant foods, fast foods, and pizza are also very high in sodium. You also get sodium by adding salt to food.  Try to cook at home, eat more fresh fruits and vegetables, and eat  less fast food and canned, processed, or prepared foods. This information is not intended to replace advice given to you by your health care provider. Make sure you discuss any questions you have with your health care provider. Document Revised: 02/27/2019 Document Reviewed: 12/24/2018 Elsevier Patient Education  2021 [employer].

## 2020-06-03 NOTE — Progress Notes (Signed)
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 Admitted to the hospital 03/29/2020 with an incarcerated umbilical hernia and bilateral inguinal hernias s/p open umbilical hernia repair and small bowel resection by Dr. Thermon Leyland. No post operative complications, he was discharged home on 04/02/2020.   He was last seen in office by Dr. Rush Landmark on 11/13/2019, at that time an EGD and colonoscopy were recommended, however, the patient was uninsured at that time therefore these procedures were not scheduled. He later obtained Medicare/Medicaid and his EGD and colonoscopy were scheduled on 04/12/3020 but these procedures were canceled as he was recovering from his emergency incarcerated hernia/bowel resection surgery.   He presents to our office today to schedule an EGD to assess for esophageal/gastric varices and a screening colonoscopy.  He feels well. No N/V or upper abdominal pain. No lower abdominal pain. He is passing a normal formed brown stool daily. No rectal bleeding or black stools. No NSAID use. He denies increase in abdominal girth and no swelling in his legs. He remains abstinent from alcohol. He is adhering to a low salt diet. He is no longer taking Furosemide (not sure when Furosemide was discontinued) but stated he is taking Spironolactone 129m QD. No confusion. No CP or SOB. He completed his Hep B vaccination series per Dr. WJoya Gaskins He has not required a paracentesis since 12/2019. No complaints today.   Labs 04/01/2020: Na 133. K 3.7. BUN 19. Cr 1.0.  Alk phos 52. T. Bili 2.2. AST 29. ALT 18. WBC 10.3. Hg 11.6. HCT 34.9. MCV 94.3. PLT 80.   His most recent paracentesis was on 12/09/2019:  Successful ultrasound-guided paracentesis yielding 5.0 liters of peritoneal fluid. No SBP.   CTAP 03/29/2020: Lower chest: Stable mild lung base scarring or atelectasis. No pleural effusion.  Hepatobiliary: Cirrhotic liver, cholelithiasis, and perihepatic ascites appears stable from last month. No discrete liver lesion.  Pancreas: Negative.  Spleen: Stable mild splenomegaly, perisplenic ascites.  Adrenals/Urinary Tract: Stable and negative adrenal glands, kidneys. Symmetric renal contrast enhancement. Urinary bladder more distended today, estimated bladder volume 330 mL.  Stomach/Bowel: Bilobed umbilical hernia containing a small volume of ascites fluid plus a single loop of small bowel (series 2, images 59 and 60) is stable and size and configuration from last month measuring about 7.3 cm diameter with hernia neck of about 12 mm. No surrounding inflammatory stranding.  Small volume ascites is stable from last month.  No dilated large bowel. Normal appendix suspected on series 2, image 69.  Mid abdominal small bowel loops are air and fluid-filled and measuring at the upper limits of normal as in January. And this appearance does extend to the umbilical hernia (series 2, images 54-59) with decompressed downstream loops. Still, the jejunum is not significantly dilated. Stomach and duodenum remain decompressed. No free air.  Vascular/Lymphatic: Aortoiliac calcified atherosclerosis. Major arterial structures remain patent. Portal venous system is patent. Small volume mesenteric varices. No lymphadenopathy.  Reproductive: Chronic inguinal hernias, containing fat on the left and fat plus a small volume of ascites on the right (decreased from last month series 2, image 92).  Other: No significant  pelvic  ascites.  Musculoskeletal: Chronic unhealed right 8th rib fracture. No acute osseous abnormality identified.  IMPRESSION: 1. Unchanged CT appearance from last month with constellation suspicious for partial small bowel obstruction secondary to a mildly incarcerated umbilical hernia containing a single small bowel loop and small volume ascites. No discrete bowel inflammation. Hernia size roughly 7 cm diameter with a 1.2 cm hernia neck.  2. Stable cirrhosis, ascites, and other stigmata of portal venous hypertension.  3. Urinary bladder more distended today, estimated volume 330 mL.  4. Chronic cholelithiasis, inguinal hernias, Aortic Atherosclerosis   ECHO 10/27/2019: 1. Left ventricular ejection fraction, by estimation, is 60 to 65%. The left ventricle has normal function. The left ventricle has no regional wall motion abnormalities. There is mild concentric left ventricular hypertrophy. Left ventricular diastolic parameters were normal. 2. No evidence of right heart failure. Right ventricular systolic function is normal. The right ventricular size is normal. There is normal pulmonary artery systolic pressure. 3. The mitral valve is normal in structure. Trivial mitral valve regurgitation. No evidence of mitral stenosis. 4. The aortic valve is tricuspid. There is mild calcification of the aortic valve. There is mild thickening of the aortic valve. Aortic valve regurgitation is not visualized. No aortic stenosis is present. 5. The inferior vena cava is normal in size with <50% respiratory variability, suggesting right atrial pressure of 8 mmHg.   Current Outpatient Medications on File Prior to Visit  Medication Sig Dispense Refill  . atorvastatin (LIPITOR) 20 MG tablet Take 1 tablet (20 mg total) by mouth daily. 90 tablet 3  . docusate sodium (COLACE) 100 MG capsule Take 1 capsule (100 mg total) by mouth 2 (two) times daily. 10 capsule 0  . polyethylene glycol (MIRALAX /  GLYCOLAX) 17 g packet Take 17 g by mouth daily as needed. 14 each 0  . spironolactone (ALDACTONE) 100 MG tablet TAKE 1 TABLET (100 MG TOTAL) BY MOUTH DAILY. 90 tablet 2   No current facility-administered medications on file prior to visit.   No Known Allergies   Current Medications, Allergies, Past Medical History, Past Surgical History, Family History and Social History were reviewed in [employer] record.   Review of Systems:   Constitutional: Negative for fever, sweats, chills or weight loss.  Respiratory: Negative for shortness of breath.   Cardiovascular: Negative for chest pain, palpitations and leg swelling.  Gastrointestinal: See HPI.  Musculoskeletal: Negative for back pain or muscle aches.  Neurological: Negative for dizziness, headaches or paresthesias.    Physical Exam: BP (!) 142/72   Pulse 72   Ht _0  (1.753 m)   Wt 196 lb (88.9 kg)   BMI 28.94 kg/m    General: 65 year old male in no acute distress. Head: Normocephalic and atraumatic. Eyes: No scleral icterus. Conjunctiva pink . Ears: Normal auditory acuity. Mouth: Dentition intact. No ulcers or lesions.  Lungs: Clear throughout to auscultation. Heart: Regular rate and rhythm, no murmur. Abdomen: Soft, nontender and nondistended. No obvious ascites. No masses or hepatomegaly. Normal bowel sounds x 4 quadrants. Midline scar intact.  Rectal: Deferred.  Musculoskeletal: Symmetrical with no gross deformities. Extremities: Trace bilateral ankle edema. Neurological: Alert oriented x 4. No focal deficits.  Psychological: Alert and cooperative. Normal mood and affect  Assessment and Recommendations:  1. EtOH cirrhosis with portal HTN, ascites, and thrombocytopenia. No overt hepatic encephalopathy -EGD to survey for esophageal/gastric varices benefits and risks discussed including risk with sedation, risk of bleeding, perforation and infection  -Continue  alcohol abstinence, no alcohol  ever -2Gm low sodium diet -CBC, CMP, INR, AFP -Surveillance abdominal sonogram Q 6 months, due 09/2020 ( CTAP 03/2020 without evidence of liver lesions/hepatoma) -Continue Spironolactone 119m QD  -Patient to call our office if he develops increased abdominal girth or leg swelling   2. Chronic hepatitis C GT 1a treated with Epclusa x 24 weeks 10/05/2019 - 04/3020 with undetectable Hep C RNA levels on at end of treatment 3/3. Due for 3 month post treatment RNA level to verify SVR/cure.  -Check Hep C RNA quant to assess for SVR with ID due 07/2020  3. Colon cancer screening  -Colonoscopy benefits and risks discussed including risk with sedation, risk of bleeding, perforation and infection   4. Normocytic anemia, likely due to cirrhosis with mild splenomegaly. No overt GI bleeding. -Labs, EGD and colonoscopy as ordered above  5. Thrombocytopenia secondary to cirrhosis with mild splenomegaly   6. Incarcerated umbilical and bilateral  inguinal hernia s/p open umbilical hernia repair with small bowel resection 03/29/2020   Follow up in office in 3 months  Further recommendations to be determined after the above evaluation completed

## 2020-06-06 LAB — AFP TUMOR MARKER: AFP-Tumor Marker: 12.9 ng/mL — ABNORMAL HIGH (ref <6.1)

## 2020-06-08 ENCOUNTER — Other Ambulatory Visit: Payer: Self-pay

## 2020-06-08 NOTE — Progress Notes (Signed)
Attending Physician's Attestation   I have reviewed the chart.   I agree with the Advanced Practitioner's note, impression, and recommendations with any updates as below.    Eric Morganti Mansouraty, MD [facility]oscopy Office # [phone]  

## 2020-06-29 ENCOUNTER — Other Ambulatory Visit: Payer: Self-pay

## 2020-07-05 ENCOUNTER — Other Ambulatory Visit: Payer: Medicare Other

## 2020-07-05 ENCOUNTER — Other Ambulatory Visit: Payer: Self-pay

## 2020-07-05 ENCOUNTER — Telehealth: Payer: Self-pay

## 2020-07-05 DIAGNOSIS — B182 Chronic viral hepatitis C: Secondary | ICD-10-CM

## 2020-07-05 DIAGNOSIS — K7030 Alcoholic cirrhosis of liver without ascites: Secondary | ICD-10-CM

## 2020-07-05 NOTE — Telephone Encounter (Signed)
-----   Message from Timothy Lasso, RN sent at 06/22/2020  9:11 AM EDT -----  ----- Message ----- From: Timothy Lasso, RN Sent: 06/22/2020  12:00 AM EDT To: Timothy Lasso, RN  Needs [place] of liver see 11/18 note

## 2020-07-05 NOTE — Telephone Encounter (Signed)
Left message on machine to call back  Order for [place] has been entered

## 2020-07-06 NOTE — Telephone Encounter (Signed)
Left message on machine to call back  

## 2020-07-07 LAB — HEPATITIS C RNA QUANTITATIVE
HCV Quantitative Log: <1.18 log IU/mL
HCV RNA, PCR, QN: <15 IU/mL

## 2020-07-07 NOTE — Telephone Encounter (Signed)
The pt has been advised and will await call from the schedulers.  He has been given the number to call if he has not heard from them in 1 week.

## 2020-07-08 ENCOUNTER — Ambulatory Visit (HOSPITAL_COMMUNITY)
Admission: RE | Admit: 2020-07-08 | Discharge: 2020-07-08 | Disposition: A | Discharge status: Home / Self Care | Payer: Medicare Other | Source: Ambulatory Visit | Attending: Gastroenterology | Admitting: Gastroenterology

## 2020-07-08 ENCOUNTER — Other Ambulatory Visit: Payer: Self-pay

## 2020-07-08 DIAGNOSIS — K7030 Alcoholic cirrhosis of liver without ascites: Secondary | ICD-10-CM | POA: Diagnosis not present

## 2020-07-19 ENCOUNTER — Other Ambulatory Visit: Payer: Self-pay

## 2020-07-19 ENCOUNTER — Ambulatory Visit (INDEPENDENT_AMBULATORY_CARE_PROVIDER_SITE_OTHER): Payer: Medicare Other | Admitting: Family

## 2020-07-19 ENCOUNTER — Encounter: Payer: Self-pay | Admitting: Family

## 2020-07-19 VITALS — BP 128/74 | HR 77 | Temp 98.1°F | Wt 195.0 lb

## 2020-07-19 DIAGNOSIS — B182 Chronic viral hepatitis C: Secondary | ICD-10-CM | POA: Diagnosis not present

## 2020-07-19 DIAGNOSIS — K7030 Alcoholic cirrhosis of liver without ascites: Secondary | ICD-10-CM

## 2020-07-19 NOTE — Patient Instructions (Signed)
Nice to see you.  Your hepatitis C is cured.  No further treatment is necessary.  It is recommended that she continue to follow-up with gastroenterology/primary care for routine liver cancer screenings every 6 to 12 months.  If you need future testing for hepatitis C it will need to be through the hepatitis C RNA level or viral load as your hepatitis C antibody will always be positive.  No additional follow-up is needed.  Have a great day and stay safe!

## 2020-07-19 NOTE — Progress Notes (Signed)
Subjective:    Patient ID: Richard Yang, male    DOB: 06-05-1955, 65 y.o.   MRN: [id number]  Chief Complaint  Patient presents with   Follow-up    3 month follow up  hep c      HPI:  Richard Yang is a 65 y.o. male with chronic hepatitis C complicated by liver cirrhosis last seen on 06/03/20 at the completion of Epclusa.  Here today for care visit.  Richard Yang has been doing well since his last office visit.  Denies any abdominal pain, nausea, vomiting, scleral icterus, or jaundice.  Overall feeling well with no new concerns/complaints.   No Known Allergies    Outpatient Medications Prior to Visit  Medication Sig Dispense Refill   atorvastatin (LIPITOR) 20 MG tablet Take 1 tablet (20 mg total) by mouth daily. 90 tablet 3   docusate sodium (COLACE) 100 MG capsule Take 1 capsule (100 mg total) by mouth 2 (two) times daily. 10 capsule 0   polyethylene glycol (MIRALAX / GLYCOLAX) 17 g packet Take 17 g by mouth daily as needed. 14 each 0   spironolactone (ALDACTONE) 100 MG tablet TAKE 1 TABLET (100 MG TOTAL) BY MOUTH DAILY. 90 tablet 2   SUPREP BOWEL PREP KIT 17.5-3.13-1.6 GM/177ML SOLN Take 1 kit by mouth as directed. For colonoscopy prep 354 mL 0   No facility-administered medications prior to visit.     Past Medical History:  Diagnosis Date   Alcohol use disorder, moderate, in early remission, dependence ([place]) 11/16/2019   Ascites    Cirrhosis (HCC)    Elevated AFP    Hepatitis C    Incarcerated umbilical hernia 4/81/8563   Inguinal hernia    Portal hypertension (HCC)    Substance abuse ([place])    alcohol abuse hx   Umbilical hernia      Past Surgical History:  Procedure Laterality Date   INGUINAL HERNIA REPAIR N/A 03/29/2020   Procedure: OPEN UMBILICAL HERNIA REPAIR, BOWEL RESECTION;  Surgeon: Felicie Morn, MD;  Location: [facility];  Service: General;  Laterality: N/A;   IR PARACENTESIS  09/16/2019   IR PARACENTESIS  10/09/2019   IR PARACENTESIS   12/09/2019       Review of Systems  Constitutional:  Negative for chills, diaphoresis, fatigue and fever.  Respiratory:  Negative for cough, chest tightness, shortness of breath and wheezing.   Cardiovascular:  Negative for chest pain.  Gastrointestinal:  Negative for abdominal distention, abdominal pain, constipation, diarrhea, nausea and vomiting.  Neurological:  Negative for weakness and headaches.  Hematological:  Does not bruise/bleed easily.     Objective:    BP 128/74   Pulse 77   Temp 98.1 F (36.7 C)   Wt 195 lb (88.5 kg)   SpO2 100%   BMI 28.80 kg/m  Nursing note and vital signs reviewed.  Physical Exam Constitutional:      General: He is not in acute distress.    Appearance: He is well-developed.  Cardiovascular:     Rate and Rhythm: Normal rate and regular rhythm.     Heart sounds: Normal heart sounds. No murmur heard.   No friction rub. No gallop.  Pulmonary:     Effort: Pulmonary effort is normal. No respiratory distress.     Breath sounds: Normal breath sounds. No wheezing or rales.  Chest:     Chest wall: No tenderness.  Abdominal:     General: Bowel sounds are normal. There is no distension.  Palpations: Abdomen is soft. There is no mass.     Tenderness: There is no abdominal tenderness. There is no guarding or rebound.  Skin:    General: Skin is warm and dry.  Neurological:     Mental Status: He is alert and oriented to person, place, and time.  Psychiatric:        Behavior: Behavior normal.        Thought Content: Thought content normal.        Judgment: Judgment normal.     Depression screen [facility] 2/9 07/19/2020 05/11/2020 01/05/2020 11/04/2019 08/06/2019  Decreased Interest 0 0 0 0 0  Down, Depressed, Hopeless 0 0 0 0 0  PHQ - 2 Score 0 0 0 0 0  Altered sleeping - - 0 - 0  Tired, decreased energy - - 0 - 0  Change in appetite - - 0 - 0  Feeling bad or failure about yourself  - - 0 - 0  Trouble concentrating - - 0 - 0  Moving slowly or  fidgety/restless - - 0 - 0  Suicidal thoughts - - 0 - 0  PHQ-9 Score - - 0 - 0       Assessment & Plan:    Patient Active Problem List   Diagnosis Date Noted   Other cirrhosis of liver ([place]) 11/16/2019   Ascites of liver 11/16/2019   Chronic hepatitis C without hepatic coma ([place]) 09/23/2019   Portal hypertension (HCC) 08/06/2019   Thrombocytopenia ([place]) 08/06/2019   Inguinal hernia 07/13/2019   Aortic atherosclerosis ([place]) 07/13/2019   History of alcohol use 07/13/2019   Liver cirrhosis, alcoholic ([place]) 29/92/4268   Hypertension 06/24/2019     Problem List Items Addressed This Visit       Digestive   Liver cirrhosis, alcoholic ([place])    Richard Yang has cirrhosis placing him at increased risk for hepatocellular carcinoma with recommendation for routine hepatocellular carcinoma screenings via primary care/gastroenterology.       Chronic hepatitis C without hepatic coma ([place]) - Primary    Richard Yang presents today for care visit following treatment with Epclusa for hepatitis C.  Continues to remain asymptomatic.  Blood work shows undetectable viral load indicating successful treatment.  Discussed importance of continued hepatocellular carcinoma screening given cirrhosis and if future hepatitis C evaluation is necessary will require hepatitis C RNA/viral load.  No further follow-up with ID necessary.         I am having Salome Arnt. Callies maintain his docusate sodium, polyethylene glycol, atorvastatin, spironolactone, and Suprep Bowel Prep Kit.   Follow-up: Return if symptoms worsen or fail to improve.   Terri Piedra, MSN, FNP-C Nurse Practitioner [facility] for [facility]ious [facility] [place] number: [phone]

## 2020-07-19 NOTE — Assessment & Plan Note (Signed)
Richard Yang presents today for care visit following treatment with Epclusa for hepatitis C.  Continues to remain asymptomatic.  Blood work shows undetectable viral load indicating successful treatment.  Discussed importance of continued hepatocellular carcinoma screening given cirrhosis and if future hepatitis C evaluation is necessary will require hepatitis C RNA/viral load.  No further follow-up with ID necessary.

## 2020-07-19 NOTE — Assessment & Plan Note (Signed)
Richard Yang has cirrhosis placing him at increased risk for hepatocellular carcinoma with recommendation for routine hepatocellular carcinoma screenings via primary care/gastroenterology.

## 2020-08-01 ENCOUNTER — Other Ambulatory Visit: Payer: Self-pay

## 2020-08-05 ENCOUNTER — Other Ambulatory Visit: Payer: Self-pay

## 2020-08-10 ENCOUNTER — Encounter: Payer: Self-pay | Admitting: Gastroenterology

## 2020-08-10 ENCOUNTER — Other Ambulatory Visit: Payer: Self-pay

## 2020-08-10 ENCOUNTER — Encounter: Payer: Medicare Other | Admitting: Gastroenterology

## 2020-08-10 ENCOUNTER — Ambulatory Visit (AMBULATORY_SURGERY_CENTER): Payer: Medicare Other | Admitting: Gastroenterology

## 2020-08-10 VITALS — BP 146/87 | HR 72 | Temp 98.4°F | Resp 15 | Ht 69.0 in | Wt 196.0 lb

## 2020-08-10 DIAGNOSIS — Z1211 Encounter for screening for malignant neoplasm of colon: Secondary | ICD-10-CM

## 2020-08-10 DIAGNOSIS — K635 Polyp of colon: Secondary | ICD-10-CM

## 2020-08-10 DIAGNOSIS — I8500 Esophageal varices without bleeding: Secondary | ICD-10-CM | POA: Diagnosis not present

## 2020-08-10 DIAGNOSIS — K319 Disease of stomach and duodenum, unspecified: Secondary | ICD-10-CM | POA: Diagnosis not present

## 2020-08-10 DIAGNOSIS — K2970 Gastritis, unspecified, without bleeding: Secondary | ICD-10-CM | POA: Diagnosis not present

## 2020-08-10 DIAGNOSIS — D125 Benign neoplasm of sigmoid colon: Secondary | ICD-10-CM

## 2020-08-10 DIAGNOSIS — K7030 Alcoholic cirrhosis of liver without ascites: Secondary | ICD-10-CM

## 2020-08-10 DIAGNOSIS — K766 Portal hypertension: Secondary | ICD-10-CM | POA: Diagnosis not present

## 2020-08-10 MED ORDER — SODIUM CHLORIDE 0.9 % IV SOLN: 500.0000 mL | Freq: Once | INTRAVENOUS | Status: DC

## 2020-08-10 MED ORDER — OMEPRAZOLE 40 MG PO CPDR
40.0000 mg | DELAYED_RELEASE_CAPSULE | Freq: Every day | ORAL | 5 refills | Status: DC
  Filled 2020-08-10: qty 30, 30d supply, fill #0
  Filled 2020-09-05: qty 30, 30d supply, fill #1
  Filled 2020-10-02: qty 30, 30d supply, fill #2

## 2020-08-10 MED ORDER — NADOLOL 20 MG PO TABS
20.0000 mg | ORAL_TABLET | Freq: Every day | ORAL | 6 refills | Status: DC
Start: 2020-08-10 — End: 2020-10-19
  Filled 2020-08-10: qty 30, 30d supply, fill #0
  Filled 2020-09-05: qty 30, 30d supply, fill #1
  Filled 2020-10-02: qty 30, 30d supply, fill #2

## 2020-08-10 NOTE — Progress Notes (Signed)
Called to room to assist during endoscopic procedure.  Patient ID and intended procedure confirmed with present staff. Received instructions for my participation in the procedure from the performing physician.  

## 2020-08-10 NOTE — Op Note (Signed)
[facility] Patient Name: Richard Yang Procedure Date: 08/10/2020 1:58 PM MRN: [id number] Endoscopist: Justice Britain , MD Age: 65 Referring MD:  Date of Birth: 19-May-1955 Gender: Male Account #: [id number] Procedure:                Upper GI endoscopy Indications:              Cirrhosis rule out esophageal varices Medicines:                Monitored Anesthesia Care Procedure:                Pre-Anesthesia Assessment:                           - Prior to the procedure, a History and Physical                            was performed, and patient medications and                            allergies were reviewed. The patient's tolerance of                            previous anesthesia was also reviewed. The risks                            and benefits of the procedure and the sedation                            options and risks were discussed with the patient.                            All questions were answered, and informed consent                            was obtained. Prior Anticoagulants: The patient has                            taken no previous anticoagulant or antiplatelet                            agents. ASA Grade Assessment: III - A patient with                            severe systemic disease. After reviewing the risks                            and benefits, the patient was deemed in                            satisfactory condition to undergo the procedure.                           After obtaining informed consent, the endoscope was  passed under direct vision. Throughout the                            procedure, the patient's blood pressure, pulse, and                            oxygen saturations were monitored continuously. The                            Endoscope was introduced through the mouth, and                            advanced to the second part of duodenum. The upper                            GI endoscopy was  accomplished without difficulty.                            The patient tolerated the procedure. Scope In: Scope Out: Findings:                 No gross lesions were noted in the proximal                            esophagus and in the mid esophagus.                           Grade I and grade II varices were found in the                            distal esophagus - no high-risk stigmata noted at                            this time.                           The Z-line was regular and was found 40 cm from the                            incisors.                           Mild portal hypertensive gastropathy was found in                            the cardia and in the gastric body.                           Scattered moderate inflammation characterized by                            erosions, erythema and friability was found at the                            incisura and in the  gastric antrum.                           No other gross lesions were noted in the entire                            examined stomach including varices. Biopsies were                            taken with a cold forceps for histology and                            Helicobacter pylori testing.                           No gross lesions were noted in the duodenal bulb,                            in the first portion of the duodenum and in the                            second portion of the duodenum. Complications:            No immediate complications. Estimated Blood Loss:     Estimated blood loss was minimal. Impression:               - No gross lesions in esophagus proximally. Grade I                            and grade II esophageal varices found distally.                           - Z-line regular, 40 cm from the incisors.                           - Portal hypertensive gastropathy proximally.                           - Gastritis distally.                           - No other gross lesions in the stomach.  Biopsied                            for HP evaluation.                           - No gross lesions in the duodenal bulb, in the                            first portion of the duodenum and in the second                            portion of the duodenum. Recommendation:           - Proceed  to scheduled colonoscopy.                           - Continue present medications.                           - Follow up pathology.                           - Start Omeprazole 40 mg daily.                           - Recommend consideration of low-dose NSBB (Nadolol                            or Propanolol) to decrease risk of bleeding from                            varices (primary prevention).                           - If patient agrees then would start Nadolol 20 mg                            daily. Monitor HRs and BPs daily. Goal will be                            resting HR in the 60-70s with sustainable SBPs.                           - Repeat EGD for variceal surveillance in 2-3 years.                           - The findings and recommendations were discussed                            with the patient. Justice Britain, MD 08/10/2020 2:44:58 PM

## 2020-08-10 NOTE — Op Note (Signed)
[facility] Patient Name: Richard Yang Procedure Date: 08/10/2020 1:53 PM MRN: [id number] Endoscopist: Justice Britain , MD Age: 65 Referring MD:  Date of Birth: 1955/08/02 Gender: Male Account #: [id number] Procedure:                Colonoscopy Indications:              Screening for colorectal malignant neoplasm Medicines:                Monitored Anesthesia Care Procedure:                Pre-Anesthesia Assessment:                           - Prior to the procedure, a History and Physical                            was performed, and patient medications and                            allergies were reviewed. The patient's tolerance of                            previous anesthesia was also reviewed. The risks                            and benefits of the procedure and the sedation                            options and risks were discussed with the patient.                            All questions were answered, and informed consent                            was obtained. Prior Anticoagulants: The patient has                            taken no previous anticoagulant or antiplatelet                            agents. ASA Grade Assessment: III - A patient with                            severe systemic disease. After reviewing the risks                            and benefits, the patient was deemed in                            satisfactory condition to undergo the procedure.                           After obtaining informed consent, the colonoscope  was passed under direct vision. Throughout the                            procedure, the patient's blood pressure, pulse, and                            oxygen saturations were monitored continuously. The                            Colonoscope was introduced through the anus and                            advanced to the the cecum, identified by                            appendiceal orifice and  ileocecal valve. The                            colonoscopy was somewhat difficult due to                            significant looping. Successful completion of the                            procedure was aided by changing the patient's                            position, using manual pressure, straightening and                            shortening the scope to obtain bowel loop reduction                            and using scope torsion. The patient tolerated the                            procedure. The quality of the bowel preparation was                            adequate. The ileocecal valve, appendiceal orifice,                            and rectum were photographed. Scope In: 2:20:33 PM Scope Out: 2:37:27 PM Scope Withdrawal Time: 0 hours 11 minutes 29 seconds  Total Procedure Duration: 0 hours 16 minutes 54 seconds  Findings:                 The digital rectal exam findings include                            hemorrhoids. Pertinent negatives include no                            palpable rectal lesions.  A large amount of semi-liquid stool was found in                            the entire colon, interfering with visualization.                            Lavage of the area was performed using copious                            amounts, resulting in clearance with adequate                            visualization.                           The colon (entire examined portion) revealed                            significantly excessive looping.                           A 4 mm polyp was found in the sigmoid colon. The                            polyp was sessile. The polyp was removed with a                            cold snare. Resection and retrieval were complete.                           Normal mucosa was found in the entire colon                            otherwise.                           Non-bleeding non-thrombosed external and internal                             hemorrhoids were found during retroflexion, during                            perianal exam and during digital exam. The                            hemorrhoids were Grade II (internal hemorrhoids                            that prolapse but reduce spontaneously). Complications:            No immediate complications. Estimated Blood Loss:     Estimated blood loss was minimal. Impression:               - Hemorrhoids found on digital rectal exam.                           -  Stool in the entire examined colon.                           - There was significant looping of the colon.                           - One 4 mm polyp in the sigmoid colon, removed with                            a cold snare. Resected and retrieved.                           - Normal mucosa in the entire examined colon                            otherwise.                           - Non-bleeding non-thrombosed external and internal                            hemorrhoids. Recommendation:           - The patient will be observed post-procedure,                            until all discharge criteria are met.                           - Discharge patient to home.                           - Patient has a contact number available for                            emergencies. The signs and symptoms of potential                            delayed complications were discussed with the                            patient. Return to normal activities tomorrow.                            Written discharge instructions were provided to the                            patient.                           - High fiber diet.                           - Use FiberCon 1-2 tablets PO daily.                           -  Continue present medications.                           - Await pathology results.                           - Repeat colonoscopy in 06/11/08 years for                            surveillance based on  pathology results.                           - The findings and recommendations were discussed                            with the patient. Justice Britain, MD 08/10/2020 2:48:11 PM

## 2020-08-10 NOTE — Progress Notes (Signed)
CHECK-IN-JB  VITAL SIGNS-VV

## 2020-08-10 NOTE — Patient Instructions (Addendum)
Handouts provided on gastritis, polyps and hemorrhoids.   Recommend a High-fiber diet (see handout).  Use FiberCon 1-2 tablets by mouth daily.   Start Omeprazole 40mg  by mouth daily for treatment of gastritis.   Start Nadolol 20mg  by mouth daily to decrease risk of bleeding from varices. Monitor your heart rate and blood pressure daily. Goal will be resting heart rate in the 60s-70s with sustainable systolic BP's (SBPs; this it the top number when checking BP). Keep a record of your readings and bring with you to your office visit in 4-6 weeks (My office will contact you to schedule this appointment).  YOU HAD AN ENDOSCOPIC PROCEDURE TODAY AT [facility]:   Refer to the procedure report that was given to you for any specific questions about what was found during the examination.  If the procedure report does not answer your questions, please call your gastroenterologist to clarify.  If you requested that your care partner not be given the details of your procedure findings, then the procedure report has been included in a sealed envelope for you to review at your convenience later.  YOU SHOULD EXPECT: Some feelings of bloating in the abdomen. Passage of more gas than usual.  Walking can help get rid of the air that was put into your GI tract during the procedure and reduce the bloating. If you had a lower endoscopy (such as a colonoscopy or flexible sigmoidoscopy) you may notice spotting of blood in your stool or on the toilet paper. If you underwent a bowel prep for your procedure, you may not have a normal bowel movement for a few days.  Please Note:  You might notice some irritation and congestion in your nose or some drainage.  This is from the oxygen used during your procedure.  There is no need for concern and it should clear up in a day or so.  SYMPTOMS TO REPORT IMMEDIATELY:  Following lower endoscopy (colonoscopy or flexible sigmoidoscopy):  Excessive amounts of blood in  the stool  Significant tenderness or worsening of abdominal pains  Swelling of the abdomen that is new, acute  Fever of 100F or higher  Following upper endoscopy (EGD)  Vomiting of blood or coffee ground material  New chest pain or pain under the shoulder blades  Painful or persistently difficult swallowing  New shortness of breath  Fever of 100F or higher  Black, tarry-looking stools  For urgent or emergent issues, a gastroenterologist can be reached at any hour by calling [phone]. Do not use MyChart messaging for urgent concerns.    DIET:  We do recommend a small meal at first, but then you may proceed to your regular diet.  Drink plenty of fluids but you should avoid alcoholic beverages for 24 hours.  ACTIVITY:  You should plan to take it easy for the rest of today and you should NOT DRIVE or use heavy machinery until tomorrow (because of the sedation medicines used during the test).    FOLLOW UP: Our staff will call the number listed on your records 48-72 hours following your procedure to check on you and address any questions or concerns that you may have regarding the information given to you following your procedure. If we do not reach you, we will leave a message.  We will attempt to reach you two times.  During this call, we will ask if you have developed any symptoms of COVID 19. If you develop any symptoms (ie: fever, flu-like symptoms, shortness  of breath, cough etc.) before then, please call [phone].  If you test positive for Covid 19 in the 2 weeks post procedure, please call and report this information to [place].    If any biopsies were taken you will be contacted by phone or by letter within the next 1-3 weeks.  Please call us at [phone] if you have not heard about the biopsies in 3 weeks.    SIGNATURES/CONFIDENTIALITY: You and/or your care partner have signed paperwork which will be entered into your electronic medical record.  These signatures attest to  the fact that that the information above on your After Visit Summary has been reviewed and is understood.  Full responsibility of the confidentiality of this discharge information lies with you and/or your care-partner.

## 2020-08-10 NOTE — Progress Notes (Signed)
A/ox3, pleased with MAC, report to RN 

## 2020-08-11 ENCOUNTER — Other Ambulatory Visit: Payer: Self-pay

## 2020-08-12 ENCOUNTER — Telehealth: Payer: Self-pay

## 2020-08-12 NOTE — Telephone Encounter (Signed)
  Follow up Call-  Call back number 08/10/2020  Post procedure Call Back phone  # [phone]  Permission to leave phone message Yes  Some recent data might be hidden     Patient questions:  Do you have a fever, pain , or abdominal swelling? No. Pain Score  0 *  Have you tolerated food without any problems? Yes.    Have you been able to return to your normal activities? Yes.    Do you have any questions about your discharge instructions: Diet   No. Medications  Yes.   Follow up visit  No.  Do you have questions or concerns about your Care? Yes.    Actions: * If pain score is 4 or above: No action needed, pain <4.  Pt has questions re: Dr Donneta Romberg recommendation re: HR and BP monitoring, pt instructed to call pcp and explain recommendation and pt states will he need a wristwatch to monitor, let him know bp and hr can be monitored with a bp machine pt can ask at the pharmacy and get recommendations for that.

## 2020-08-18 ENCOUNTER — Encounter: Payer: Self-pay | Admitting: Gastroenterology

## 2020-08-24 ENCOUNTER — Ambulatory Visit: Payer: Medicare Other | Admitting: Gastroenterology

## 2020-08-30 ENCOUNTER — Ambulatory Visit: Payer: Medicare Other

## 2020-09-05 ENCOUNTER — Other Ambulatory Visit: Payer: Self-pay

## 2020-09-06 ENCOUNTER — Other Ambulatory Visit: Payer: Self-pay

## 2020-09-12 ENCOUNTER — Ambulatory Visit: Payer: Medicare Other

## 2020-09-30 ENCOUNTER — Other Ambulatory Visit (INDEPENDENT_AMBULATORY_CARE_PROVIDER_SITE_OTHER): Payer: Medicare Other

## 2020-09-30 ENCOUNTER — Ambulatory Visit (INDEPENDENT_AMBULATORY_CARE_PROVIDER_SITE_OTHER): Payer: Medicare Other | Admitting: Gastroenterology

## 2020-09-30 ENCOUNTER — Encounter: Payer: Self-pay | Admitting: Gastroenterology

## 2020-09-30 VITALS — BP 140/78 | HR 58 | Ht 69.0 in | Wt 205.1 lb

## 2020-09-30 DIAGNOSIS — Z8719 Personal history of other diseases of the digestive system: Secondary | ICD-10-CM

## 2020-09-30 DIAGNOSIS — R1032 Left lower quadrant pain: Secondary | ICD-10-CM

## 2020-09-30 DIAGNOSIS — K3189 Other diseases of stomach and duodenum: Secondary | ICD-10-CM

## 2020-09-30 DIAGNOSIS — K7460 Unspecified cirrhosis of liver: Secondary | ICD-10-CM

## 2020-09-30 DIAGNOSIS — B182 Chronic viral hepatitis C: Secondary | ICD-10-CM | POA: Diagnosis not present

## 2020-09-30 DIAGNOSIS — K766 Portal hypertension: Secondary | ICD-10-CM

## 2020-09-30 LAB — COMPREHENSIVE METABOLIC PANEL
ALT: 19 U/L (ref 0–53)
AST: 28 U/L (ref 0–37)
Albumin: 3.8 g/dL (ref 3.5–5.2)
Alkaline Phosphatase: 78 U/L (ref 39–117)
BUN: 15 mg/dL (ref 6–23)
CO2: 25 mEq/L (ref 19–32)
Calcium: 9.4 mg/dL (ref 8.4–10.5)
Chloride: 105 mEq/L (ref 96–112)
Creatinine, Ser: 1.15 mg/dL (ref 0.40–1.50)
GFR: 66.45 mL/min (ref >60.00)
Glucose, Bld: 102 mg/dL — ABNORMAL HIGH (ref 70–99)
Potassium: 4.2 mEq/L (ref 3.5–5.1)
Sodium: 137 mEq/L (ref 135–145)
Total Bilirubin: 1.1 mg/dL (ref 0.2–1.2)
Total Protein: 7.3 g/dL (ref 6.0–8.3)

## 2020-09-30 LAB — PROTIME-INR
INR: 1.1 ratio — ABNORMAL HIGH (ref 0.8–1.0)
Prothrombin Time: 11.9 s (ref 9.6–13.1)

## 2020-09-30 LAB — CBC
HCT: 39.2 % (ref 39.0–52.0)
Hemoglobin: 13 g/dL (ref 13.0–17.0)
MCHC: 33.2 g/dL (ref 30.0–36.0)
MCV: 93.5 fl (ref 78.0–100.0)
Platelets: 87 10*3/uL — ABNORMAL LOW (ref 150.0–400.0)
RBC: 4.19 Mil/uL — ABNORMAL LOW (ref 4.22–5.81)
RDW: 14.7 % (ref 11.5–15.5)
WBC: 5 10*3/uL (ref 4.0–10.5)

## 2020-09-30 NOTE — Progress Notes (Signed)
[place] VISIT   Primary Care Provider Elsie Stain, MD [address]  [id number] [phone]   Patient Profile: Richard Yang is a 65 y.o. male with a pmh significant for cirrhosis secondary to [facility] and alcohol (complicated by portal hypertension manifested as esophageal varices, portal hypertensive gastropathy, previous ascites), chronic HCV (status posttreatment) previous umbilical hernia (status post surgical intervention), cholelithiasis, hyperlipidemia.  The patient presents to the [facility] for an evaluation and management of problem(s) noted below:  Problem List 1. Hepatic cirrhosis, secondary to HCV/alcohol, prior history of ascites currently stable ([place])   2. Chronic hepatitis C without hepatic coma ([place])   3. Hx of esophageal varices   4. Portal hypertensive gastropathy ([place])   5. Left lower quadrant pain     History of Present Illness Please see initial consultation note and prior progress notes by NP [facility] for full details of HPI.  Interval History The patient returns for scheduled follow-up.  The patient recently underwent his upper and lower endoscopy in July.  He has evidence of portal hypertension manifested as esophageal varices for which we placed the patient on beta-blockade.  He seems to have tolerated that.  He denies any melena or hematemesis or coffee-ground emesis.  Overall patient has been doing well.  After his colonoscopy, he has experienced infrequent 1 to 2-second episodes of left lower quadrant abdominal pain that come and go without any particular movement or food intake being performed.  He wonders if this is a complication from his colonoscopy.  He has been gaining weight but believes this is more result of calories rather than fluid intake in his abdomen.  He is going to be monitoring his weight closely.  The patient denies any issues with jaundice, scleral icterus,  generalized pruritus, darkened/amber urine, clay-colored stools, LE edema, hematemesis, coffee-ground emesis, confusion.    GI Review of Systems Positive as above Negative for dysphagia, odynophagia, nausea, vomiting, alteration of bowel habits, hematochezia  Review of Systems General: Denies fevers/chills/weight loss unintentionally HEENT: Denies oral lesions Cardiovascular: Denies chest pain/palpitations Pulmonary: Denies shortness of breath Gastroenterological: See HPI Genitourinary: Denies darkened urine Hematological: Denies easy bruising/bleeding Dermatological: Denies jaundice Psychological: Mood is stable   Medications Current Outpatient Medications  Medication Sig Dispense Refill   atorvastatin (LIPITOR) 20 MG tablet Take 1 tablet (20 mg total) by mouth daily. 90 tablet 3   docusate sodium (COLACE) 100 MG capsule Take 1 capsule (100 mg total) by mouth 2 (two) times daily. 10 capsule 0   nadolol (CORGARD) 20 MG tablet Take 1 tablet (20 mg total) by mouth daily. 30 tablet 6   omeprazole (PRILOSEC) 40 MG capsule Take 1 capsule (40 mg total) by mouth daily. 30 capsule 5   polyethylene glycol (MIRALAX / GLYCOLAX) 17 g packet Take 17 g by mouth daily as needed. 14 each 0   spironolactone (ALDACTONE) 100 MG tablet TAKE 1 TABLET (100 MG TOTAL) BY MOUTH DAILY. 90 tablet 2   No current facility-administered medications for this visit.    Allergies No Known Allergies  Histories Past Medical History:  Diagnosis Date   Alcohol use disorder, moderate, in early remission, dependence ([place]) 11/16/2019   Ascites    Cirrhosis (HCC)    Elevated AFP    Hepatitis C    Incarcerated umbilical hernia 4/40/3474   Inguinal hernia    Portal hypertension (HCC)    Substance abuse (HCC)    alcohol abuse hx   Umbilical hernia  Past Surgical History:  Procedure Laterality Date   INGUINAL HERNIA REPAIR N/A 03/29/2020   Procedure: OPEN UMBILICAL HERNIA REPAIR, BOWEL RESECTION;  Surgeon:  Felicie Morn, MD;  Location: [facility];  Service: General;  Laterality: N/A;   IR PARACENTESIS  09/16/2019   IR PARACENTESIS  10/09/2019   IR PARACENTESIS  12/09/2019   Social History   Socioeconomic History   Marital status: Single    Spouse name: Not on file   Number of children: Not on file   Years of education: Not on file   Highest education level: Not on file  Occupational History   Not on file  Tobacco Use   Smoking status: Never   Smokeless tobacco: Never  Vaping Use   Vaping Use: Never used  Substance and Sexual Activity   Alcohol use: Not Currently    Comment: sober since May 2021   Drug use: Not Currently   Sexual activity: Not Currently    Partners: Male  Other Topics Concern   Not on file  Social History Narrative   Not on file   Social Determinants of Health   Financial Resource Strain: Not on file  Food Insecurity: Not on file  Transportation Needs: Not on file  Physical Activity: Not on file  Stress: Not on file  Social Connections: Not on file  Intimate Partner Violence: Not on file   Family History  Problem Relation Age of Onset   Stomach cancer Neg Hx    Colon cancer Neg Hx    Esophageal cancer Neg Hx    Pancreatic cancer Neg Hx    Colon polyps Neg Hx    Rectal cancer Neg Hx    I have reviewed his medical, social, and family history in detail and updated the electronic medical record as necessary.    PHYSICAL EXAMINATION  BP 140/78 (BP Location: Left Arm, Patient Position: Sitting, Cuff Size: Normal)   Pulse (!) 58   Ht _0  (1.753 m)   Wt 205 lb 2 oz (93 kg)   SpO2 98%   BMI 30.29 kg/m  Wt Readings from Last 3 Encounters:  09/30/20 205 lb 2 oz (93 kg)  08/10/20 196 lb (88.9 kg)  07/19/20 195 lb (88.5 kg)  GEN: NAD, appears stated age, doesn't appear chronically ill PSYCH: Cooperative, without pressured speech EYE: Conjunctivae pink, sclerae anicteric ENT: MMM CV: Nontachycardic RESP: No audible wheezing GI: NABS, soft,  protuberant abdomen, no shifting dullness appreciated, NT/ND, without rebound or guarding MSK/EXT: No lower extremity edema present SKIN: No jaundice, no spider angiomata NEURO:  Alert & Oriented x 3, no focal deficits, no evidence of asterixis   REVIEW OF DATA  I reviewed the following data at the time of this encounter:  GI Procedures and Studies  July 2022 EGD - No gross lesions in esophagus proximally. Grade I and grade II esophageal varices found distally. - Z-line regular, 40 cm from the incisors. - Portal hypertensive gastropathy proximally. - Gastritis distally. - No other gross lesions in the stomach. Biopsied for HP evaluation. - No gross lesions in the duodenal bulb, in the first portion of the duodenum and in the second portion of the duodenum.  July 2022 Colonoscopy - Hemorrhoids found on digital rectal exam. - Stool in the entire examined colon. - There was significant looping of the colon. - One 4 mm polyp in the sigmoid colon, removed with a cold snare. Resected and retrieved. - Normal mucosa in the entire examined colon otherwise. -  Non-bleeding non-thrombosed external and internal hemorrhoids.  Pathology Diagnosis 1. Surgical [P], gastric - GASTRIC ANTRAL AND OXYNTIC MUCOSA WITH NO SPECIFIC HISTOPATHOLOGIC CHANGES - WARTHIN STARRY STAIN IS NEGATIVE FOR HELICOBACTER PYLORI 2. Surgical [P], colon, sigmoid, polyp (1) - HYPERPLASTIC POLYP - MULTIPLE STEP SECTIONS WERE EXAMINED  Laboratory Studies  Reviewed those in epic and care everywhere  Imaging Studies  June 2022 abdominal ultrasound IMPRESSION: 1. Cirrhotic morphology of the liver. No focal liver lesion identified. 2. Cholelithiasis. Mild diffuse gallbladder wall thickening which may be related to intrinsic liver disease. No pericholecystic fluid or sonographic Murphy sign to suggest cholecystitis. If there is clinical suspicion, consider nuclear medicine hepatobiliary scan. 3. Trace perihepatic  ascites. 4. Mild splenomegaly.   ASSESSMENT  Richard Yang is a 65 y.o. male with a pmh significant for cirrhosis secondary to [facility] and alcohol (complicated by portal hypertension manifested as esophageal varices, portal hypertensive gastropathy, previous ascites), chronic HCV (status posttreatment) previous umbilical hernia (status post surgical intervention), cholelithiasis, hyperlipidemia.  The patient is seen today for evaluation and management of:  1. Hepatic cirrhosis, secondary to HCV/alcohol, prior history of ascites currently stable ([place])   2. Chronic hepatitis C without hepatic coma ([place])   3. Hx of esophageal varices   4. Portal hypertensive gastropathy ([place])   5. Left lower quadrant pain    The patient is clinically and hemodynamically stable.  He seems to be in a compensated status.  He looks to be doing well with his current beta-blockade and we will continue that at current dosing.  We will evaluate if the patient has obtained hepatitis A and hepatitis B immune status after previous vaccinations per his report.  Patient will monitor his weight and ensure that it is not increasing significantly such that we become more concerned about the potential of abdominal ascites reaccumulating.  Otherwise we will plan repeat imaging for [facility] in December of this year with a CT abdomen/pelvis.  Patient will see [place] after his CT scan.  I am not sure what to make of the left lower quadrant abdominal discomfort that is occurring at times but he will let us know if things develop or change.  All patient questions were answered to the best of my ability, and the patient agrees to the aforementioned plan of action with follow-up as indicated.   PLAN  #ESLD Management Volume -Continue spironolactone 100 mg -Obtain daily standing weight and monitor for significant weight changes -1500-2000 mg Na diet Infection -No evidence of SBP Bleeding -Esophageal varices grade 1 and grade 2 noted now on  beta-blockade -Repeat EGD in 2024 or sooner if any decompensation Encephalopathy -None Screening -HCC screening due by December 2022 (ordered) Transplant -Low meld so not a transplant candidate at this time Vaccination -We will evaluate for HAV and HBV immunity  -Recommend patient discuss with PCP pneumococcal and influenza vaccines  Other -Obtain meld labs today -Promote intake of 1.5 g/kg/day of Protein (Ensures/Boosts at each meal)   Orders Placed This Encounter  Procedures   CT Abdomen Pelvis W Contrast   CBC   Comp Met (CMET)   INR/PT   Hepatitis A Ab, Total   Hepatitis B Surface AntiBODY   Hepatitis B Core Antibody, total    New Prescriptions   No medications on file   Modified Medications   No medications on file    Planned Follow Up No follow-ups on file.   Total Time in Face-to-Face and in Coordination of Care for patient including independent/personal interpretation/review of  prior testing, medical history, examination, medication adjustment, communicating results with the patient directly, and documentation with the EHR is 25 minutes.   Justice Britain, MD [facility] Office # [phone]

## 2020-09-30 NOTE — Patient Instructions (Signed)
Your provider has requested that you go to the basement level for lab work before leaving today. Press "B" on the elevator. The lab is located at the first door on the left as you exit the elevator.   You have been scheduled for a CT scan of the abdomen and pelvis at [facility], 1st floor Radiology. You are scheduled on 01/26/21  at 8:30am. You should arrive 15 minutes prior to your appointment time for registration.  Please pick up 2 bottles of contrast from [facility] at least 3 days prior to your scan. The solution may taste better if refrigerated, but do NOT add ice or any other liquid to this solution. Shake well before drinking.   Please follow the written instructions below on the day of your exam:   1) Do not eat anything after 4:30am (4 hours prior to your test)   2) Drink 1 bottle of contrast @ 6:30am (2 hours prior to your exam)  Remember to shake well before drinking and do NOT pour over ice.     Drink 1 bottle of contrast @ 7:30am (1 hour prior to your exam)   You may take any medications as prescribed with a small amount of water, if necessary. If you take any of the following medications: METFORMIN, GLUCOPHAGE, GLUCOVANCE, AVANDAMET, RIOMET, FORTAMET, [place] MET, JANUMET, GLUMETZA or METAGLIP, you MAY be asked to HOLD this medication 48 hours AFTER the exam.   The purpose of you drinking the oral contrast is to aid in the visualization of your intestinal tract. The contrast solution may cause some diarrhea. Depending on your individual set of symptoms, you may also receive an intravenous injection of x-ray contrast/dye. Plan on being at [facility] for 45 minutes or longer, depending on the type of exam you are having performed.   If you have any questions regarding your exam or if you need to reschedule, you may call [facility] at [phone] between the hours of 8:00 am and 5:00 pm, Monday-Friday.    Due to recent changes in healthcare laws, you may see the  results of your imaging and laboratory studies on MyChart before your provider has had a chance to review them.  We understand that in some cases there may be results that are confusing or concerning to you. Not all laboratory results come back in the same time frame and the provider may be waiting for multiple results in order to interpret others.  Please give [place] 48 hours in order for your provider to thoroughly review all the results before contacting the office for clarification of your results.   Thank you for choosing me and [facility].  Dr. Rush Landmark

## 2020-10-03 ENCOUNTER — Other Ambulatory Visit: Payer: Self-pay

## 2020-10-03 DIAGNOSIS — K7460 Unspecified cirrhosis of liver: Secondary | ICD-10-CM | POA: Insufficient documentation

## 2020-10-03 DIAGNOSIS — K3189 Other diseases of stomach and duodenum: Secondary | ICD-10-CM | POA: Insufficient documentation

## 2020-10-03 DIAGNOSIS — K766 Portal hypertension: Secondary | ICD-10-CM | POA: Insufficient documentation

## 2020-10-03 DIAGNOSIS — R1032 Left lower quadrant pain: Secondary | ICD-10-CM | POA: Insufficient documentation

## 2020-10-03 DIAGNOSIS — Z8719 Personal history of other diseases of the digestive system: Secondary | ICD-10-CM | POA: Insufficient documentation

## 2020-10-03 LAB — HEPATITIS B SURFACE ANTIBODY,QUALITATIVE: Hep B S Ab: NONREACTIVE

## 2020-10-03 LAB — HEPATITIS A ANTIBODY, TOTAL: Hepatitis A AB,Total: NONREACTIVE

## 2020-10-03 LAB — HEPATITIS B CORE ANTIBODY, TOTAL: Hep B Core Total Ab: NONREACTIVE

## 2020-10-04 ENCOUNTER — Other Ambulatory Visit: Payer: Self-pay

## 2020-10-13 ENCOUNTER — Telehealth: Payer: Self-pay | Admitting: Gastroenterology

## 2020-10-13 NOTE — Telephone Encounter (Addendum)
Patient is returning your call said he was having issues with his phone not able to receive calls.

## 2020-10-14 NOTE — Telephone Encounter (Signed)
The pt returned call and has been advised of the appt for [place] and office visit in December after CT scan.

## 2020-10-14 NOTE — Telephone Encounter (Signed)
Left message on machine to call back  

## 2020-10-14 NOTE — Telephone Encounter (Signed)
Richard Yang, Please let the patient know that I have reviewed his hepatitis A and hepatitis B serologies.  Unfortunately it looks that even though he described having been vaccinated against hepatitis A and hepatitis B, he does not have immunity.  He needs to be vaccinated once again as per protocol, so please set up the appointments for vaccination/immunization. He should be set up for follow-up in clinic with NP [facility] or myself in December (after his CT abdomen/pelvis performed for [facility] screening). Thanks. GM

## 2020-10-19 ENCOUNTER — Encounter: Payer: Self-pay | Admitting: Critical Care Medicine

## 2020-10-19 ENCOUNTER — Ambulatory Visit: Payer: Medicare Other | Attending: Critical Care Medicine | Admitting: Critical Care Medicine

## 2020-10-19 ENCOUNTER — Ambulatory Visit: Payer: Medicare Other | Admitting: Critical Care Medicine

## 2020-10-19 ENCOUNTER — Other Ambulatory Visit: Payer: Self-pay

## 2020-10-19 VITALS — BP 132/84 | HR 69 | Resp 16 | Ht 70.0 in | Wt 205.8 lb

## 2020-10-19 DIAGNOSIS — D696 Thrombocytopenia, unspecified: Secondary | ICD-10-CM

## 2020-10-19 DIAGNOSIS — K7030 Alcoholic cirrhosis of liver without ascites: Secondary | ICD-10-CM

## 2020-10-19 DIAGNOSIS — Z0000 Encounter for general adult medical examination without abnormal findings: Secondary | ICD-10-CM | POA: Diagnosis not present

## 2020-10-19 DIAGNOSIS — B182 Chronic viral hepatitis C: Secondary | ICD-10-CM

## 2020-10-19 DIAGNOSIS — K766 Portal hypertension: Secondary | ICD-10-CM

## 2020-10-19 DIAGNOSIS — I10 Essential (primary) hypertension: Secondary | ICD-10-CM

## 2020-10-19 DIAGNOSIS — Z23 Encounter for immunization: Secondary | ICD-10-CM

## 2020-10-19 DIAGNOSIS — K2970 Gastritis, unspecified, without bleeding: Secondary | ICD-10-CM | POA: Diagnosis not present

## 2020-10-19 DIAGNOSIS — K4021 Bilateral inguinal hernia, without obstruction or gangrene, recurrent: Secondary | ICD-10-CM

## 2020-10-19 DIAGNOSIS — R188 Other ascites: Secondary | ICD-10-CM

## 2020-10-19 MED ORDER — SPIRONOLACTONE 100 MG PO TABS
ORAL_TABLET | Freq: Every day | ORAL | 2 refills | Status: DC
  Filled 2020-10-19: qty 90, fill #0
  Filled 2020-11-02: qty 90, 90d supply, fill #0
  Filled 2021-02-06: qty 90, 90d supply, fill #1
  Filled 2021-05-09: qty 90, 90d supply, fill #0

## 2020-10-19 MED ORDER — ATORVASTATIN CALCIUM 20 MG PO TABS
20.0000 mg | ORAL_TABLET | Freq: Every day | ORAL | 3 refills | Status: DC
  Filled 2020-10-19 – 2020-11-02 (×2): qty 90, 90d supply, fill #0
  Filled 2021-02-06: qty 90, 90d supply, fill #1
  Filled 2021-05-09: qty 90, 90d supply, fill #0

## 2020-10-19 MED ORDER — OMEPRAZOLE 40 MG PO CPDR
40.0000 mg | DELAYED_RELEASE_CAPSULE | Freq: Every day | ORAL | 5 refills | Status: DC
  Filled 2020-10-19: qty 30, 30d supply, fill #0
  Filled 2020-11-02: qty 90, 90d supply, fill #0
  Filled 2021-02-06: qty 90, 90d supply, fill #1

## 2020-10-19 MED ORDER — NADOLOL 20 MG PO TABS
20.0000 mg | ORAL_TABLET | Freq: Every day | ORAL | 6 refills | Status: DC
  Filled 2020-10-19: qty 30, 30d supply, fill #0
  Filled 2020-11-02: qty 90, 90d supply, fill #0
  Filled 2021-02-06: qty 90, 90d supply, fill #1
  Filled 2021-05-09: qty 30, 30d supply, fill #0

## 2020-10-19 NOTE — Assessment & Plan Note (Signed)
Ascites stable

## 2020-10-19 NOTE — Assessment & Plan Note (Signed)
Maintain current medications including Aldactone and Corgard

## 2020-10-19 NOTE — Assessment & Plan Note (Signed)
Improved on recent lab assessment

## 2020-10-19 NOTE — Progress Notes (Deleted)
Subjective:   Richard Yang is a 65 y.o. male who presents for a Welcome to Medicare exam.   Review of Systems: ***       Objective:    There were no vitals filed for this visit. There is no height or weight on file to calculate BMI.  Medications Outpatient Encounter Medications as of 10/19/2020  Medication Sig   atorvastatin (LIPITOR) 20 MG tablet Take 1 tablet (20 mg total) by mouth daily.   docusate sodium (COLACE) 100 MG capsule Take 1 capsule (100 mg total) by mouth 2 (two) times daily.   nadolol (CORGARD) 20 MG tablet Take 1 tablet (20 mg total) by mouth daily.   omeprazole (PRILOSEC) 40 MG capsule Take 1 capsule (40 mg total) by mouth daily.   polyethylene glycol (MIRALAX / GLYCOLAX) 17 g packet Take 17 g by mouth daily as needed.   spironolactone (ALDACTONE) 100 MG tablet TAKE 1 TABLET (100 MG TOTAL) BY MOUTH DAILY.   No facility-administered encounter medications on file as of 10/19/2020.     History: Past Medical History:  Diagnosis Date   Alcohol use disorder, moderate, in early remission, dependence ([place]) 11/16/2019   Ascites    Cirrhosis (HCC)    Elevated AFP    Hepatitis C    Incarcerated umbilical hernia [id number]   Inguinal hernia    Portal hypertension ([place])    Substance abuse ([place])    alcohol abuse hx   Umbilical hernia    Past Surgical History:  Procedure Laterality Date   INGUINAL HERNIA REPAIR N/A 03/29/2020   Procedure: OPEN UMBILICAL HERNIA REPAIR, BOWEL RESECTION;  Surgeon: Felicie Morn, MD;  Location: [facility];  Service: General;  Laterality: N/A;   IR PARACENTESIS  09/16/2019   IR PARACENTESIS  10/09/2019   IR PARACENTESIS  12/09/2019    Family History  Problem Relation Age of Onset   Stomach cancer Neg Hx    Colon cancer Neg Hx    Esophageal cancer Neg Hx    Pancreatic cancer Neg Hx    Colon polyps Neg Hx    Rectal cancer Neg Hx    Social History   Occupational History   Not on file  Tobacco Use   Smoking status: Never    Smokeless tobacco: Never  Vaping Use   Vaping Use: Never used  Substance and Sexual Activity   Alcohol use: Not Currently    Comment: sober since May 2021   Drug use: Not Currently   Sexual activity: Not Currently    Partners: Male   Tobacco Counseling Counseling given: Not Answered   Immunizations and Health Maintenance Immunization History  Administered Date(s) Administered   Hepatitis A, Adult 11/04/2019   Hepatitis B, adult 09/17/2019, 11/04/2019, 12/04/2019   Influenza,inj,Quad PF,6+ Mos 01/05/2020   Moderna Sars-Covid-2 Vaccination 07/13/2019, 08/10/2019   Pneumococcal Conjugate-13 01/28/2020   Tdap 02/11/2020   Health Maintenance Due  Topic Date Due   Zoster Vaccines- Shingrix (1 of 2) Never done   COVID-19 Vaccine (3 - Booster for Moderna series) 01/10/2020   INFLUENZA VACCINE  09/05/2020    Activities of Daily Living In your present state of health, do you have any difficulty performing the following activities: 03/29/2020 03/29/2020  Hearing? - N  Vision? - N  Difficulty concentrating or making decisions? - N  Walking or climbing stairs? - N  Dressing or bathing? - N  Doing errands, shopping? N -  Some recent data might be hidden    Physical  Exam  ***(optional), or other factors deemed appropriate based on the beneficiary's medical and social history and current clinical standards.  Advanced Directives:      Assessment:    This is a routine wellness  examination for this patient . ***  Vision/Hearing screen No results found.  Dietary issues and exercise activities discussed:      Goals   None     Depression Screen PHQ 2/9 Scores 07/19/2020 05/11/2020 01/05/2020 11/04/2019  PHQ - 2 Score 0 0 0 0  PHQ- 9 Score - - 0 -     Fall Risk Fall Risk  07/19/2020  Falls in the past year? 0  Number falls in past yr: 0  Injury with Fall? 0  Risk for fall due to : -  Follow up Falls evaluation completed    Cognitive Function        Patient Care  Team: Elsie Stain, MD as PCP - General (Pulmonary Disease)     Plan:   ***  I have personally reviewed and noted the following in the patient's chart:   Medical and social history Use of alcohol, tobacco or illicit drugs  Current medications and supplements Functional ability and status Nutritional status Physical activity Advanced directives List of other physicians Hospitalizations, surgeries, and ER visits in previous 12 months Vitals Screenings to include cognitive, depression, and falls Referrals and appointments  In addition, I have reviewed and discussed with patient certain preventive protocols, quality metrics, and best practice recommendations. A written personalized care plan for preventive services as well as general preventive health recommendations were provided to patient.    Asencion Noble, MD 10/19/2020       Subjective:   Richard Yang is a 65 y.o. male who presents for a Welcome to Medicare exam.   Review of Systems: ***       Objective:    There were no vitals filed for this visit. There is no height or weight on file to calculate BMI.  Medications Outpatient Encounter Medications as of 10/19/2020  Medication Sig   atorvastatin (LIPITOR) 20 MG tablet Take 1 tablet (20 mg total) by mouth daily.   docusate sodium (COLACE) 100 MG capsule Take 1 capsule (100 mg total) by mouth 2 (two) times daily.   nadolol (CORGARD) 20 MG tablet Take 1 tablet (20 mg total) by mouth daily.   omeprazole (PRILOSEC) 40 MG capsule Take 1 capsule (40 mg total) by mouth daily.   polyethylene glycol (MIRALAX / GLYCOLAX) 17 g packet Take 17 g by mouth daily as needed.   spironolactone (ALDACTONE) 100 MG tablet TAKE 1 TABLET (100 MG TOTAL) BY MOUTH DAILY.   No facility-administered encounter medications on file as of 10/19/2020.     History: Past Medical History:  Diagnosis Date   Alcohol use disorder, moderate, in early remission, dependence ([place]) 11/16/2019    Ascites    Cirrhosis (HCC)    Elevated AFP    Hepatitis C    Incarcerated umbilical hernia [id number]   Inguinal hernia    Portal hypertension (HCC)    Substance abuse ([place])    alcohol abuse hx   Umbilical hernia    Past Surgical History:  Procedure Laterality Date   INGUINAL HERNIA REPAIR N/A 03/29/2020   Procedure: OPEN UMBILICAL HERNIA REPAIR, BOWEL RESECTION;  Surgeon: Felicie Morn, MD;  Location: [facility];  Service: General;  Laterality: N/A;   IR PARACENTESIS  09/16/2019   IR PARACENTESIS  10/09/2019   IR PARACENTESIS  12/09/2019    Family History  Problem Relation Age of Onset   Stomach cancer Neg Hx    Colon cancer Neg Hx    Esophageal cancer Neg Hx    Pancreatic cancer Neg Hx    Colon polyps Neg Hx    Rectal cancer Neg Hx    Social History   Occupational History   Not on file  Tobacco Use   Smoking status: Never   Smokeless tobacco: Never  Vaping Use   Vaping Use: Never used  Substance and Sexual Activity   Alcohol use: Not Currently    Comment: sober since May 2021   Drug use: Not Currently   Sexual activity: Not Currently    Partners: Male   Tobacco Counseling Counseling given: Not Answered   Immunizations and Health Maintenance Immunization History  Administered Date(s) Administered   Hepatitis A, Adult 11/04/2019   Hepatitis B, adult 09/17/2019, 11/04/2019, 12/04/2019   Influenza,inj,Quad PF,6+ Mos 01/05/2020   Moderna Sars-Covid-2 Vaccination 07/13/2019, 08/10/2019   Pneumococcal Conjugate-13 01/28/2020   Tdap 02/11/2020   Health Maintenance Due  Topic Date Due   Zoster Vaccines- Shingrix (1 of 2) Never done   COVID-19 Vaccine (3 - Booster for Moderna series) 01/10/2020   INFLUENZA VACCINE  09/05/2020    Activities of Daily Living In your present state of health, do you have any difficulty performing the following activities: 03/29/2020 03/29/2020  Hearing? - N  Vision? - N  Difficulty concentrating or making decisions? - N  Walking  or climbing stairs? - N  Dressing or bathing? - N  Doing errands, shopping? N -  Some recent data might be hidden    Physical Exam  ***(optional), or other factors deemed appropriate based on the beneficiary's medical and social history and current clinical standards.  Advanced Directives:      Assessment:    This is a routine wellness  examination for this patient . ***  Vision/Hearing screen No results found.  Dietary issues and exercise activities discussed:      Goals   None     Depression Screen PHQ 2/9 Scores 07/19/2020 05/11/2020 01/05/2020 11/04/2019  PHQ - 2 Score 0 0 0 0  PHQ- 9 Score - - 0 -     Fall Risk Fall Risk  07/19/2020  Falls in the past year? 0  Number falls in past yr: 0  Injury with Fall? 0  Risk for fall due to : -  Follow up Falls evaluation completed    Cognitive Function        Patient Care Team: Elsie Stain, MD as PCP - General (Pulmonary Disease)     Plan:   ***  I have personally reviewed and noted the following in the patient's chart:   Medical and social history Use of alcohol, tobacco or illicit drugs  Current medications and supplements Functional ability and status Nutritional status Physical activity Advanced directives List of other physicians Hospitalizations, surgeries, and ER visits in previous 12 months Vitals Screenings to include cognitive, depression, and falls Referrals and appointments  In addition, I have reviewed and discussed with patient certain preventive protocols, quality metrics, and best practice recommendations. A written personalized care plan for preventive services as well as general preventive health recommendations were provided to patient.    Asencion Noble, MD 10/19/2020

## 2020-10-19 NOTE — Assessment & Plan Note (Signed)
Planning surgical approach

## 2020-10-19 NOTE — Progress Notes (Signed)
Subjective:   Richard Yang is a 65 y.o. male who presents for a Welcome to Medicare exam.  The patient presents today for Medicare wellness exam.  The patient has no specific complaints at this visit.  He has chronic hepatitis C and alcohol induced cirrhotic liver disease are under good control.  He does have a recurrent right inguinal hernia that is under observation and he will be seeing general surgery in follow-up likely will need surgical approach.   Review of Systems: Pos in [place] Constitutional:   No  weight loss, night sweats,  Fevers, chills, fatigue, lassitude. HEENT:   No headaches,  Difficulty swallowing,  Tooth/dental problems,  Sore throat,                sneezing, itching, ear ache, nasal congestion, post nasal drip,   CV:  No chest pain,  Orthopnea, PND, swelling in lower extremities, anasarca, dizziness, palpitations  GI  No heartburn, indigestion, abdominal pain, nausea, vomiting, diarrhea, change in bowel habits, loss of appetite  Resp: No shortness of breath with exertion or at rest.  No excess mucus, no productive cough,  No non-productive cough,  No coughing up of blood.  No change in color of mucus.  No wheezing.  No chest wall deformity  Skin: no rash or lesions.  GU: no dysuria, change in color of urine, no urgency or frequency.  No flank pain.  MS:  No joint pain or swelling.  No decreased range of motion.  No back pain.  Psych:  No change in mood or affect. No depression or anxiety.  No memory loss.        Objective:    Today's Vitals   10/19/20 0904 10/19/20 1137  BP: (!) 142/80 132/84  Pulse: 69   Resp: 16   SpO2: 98%   Weight: 205 lb 12.8 oz (93.4 kg)   Height: '5\' 10"'$  (1.778 m)    Body mass index is 29.53 kg/m.  Medications Outpatient Encounter Medications as of 10/19/2020  Medication Sig   docusate sodium (COLACE) 100 MG capsule Take 1 capsule (100 mg total) by mouth 2 (two) times daily.   polyethylene glycol (MIRALAX / GLYCOLAX) 17 g  packet Take 17 g by mouth daily as needed.   [DISCONTINUED] atorvastatin (LIPITOR) 20 MG tablet Take 1 tablet (20 mg total) by mouth daily.   [DISCONTINUED] nadolol (CORGARD) 20 MG tablet Take 1 tablet (20 mg total) by mouth daily.   [DISCONTINUED] omeprazole (PRILOSEC) 40 MG capsule Take 1 capsule (40 mg total) by mouth daily.   [DISCONTINUED] spironolactone (ALDACTONE) 100 MG tablet TAKE 1 TABLET (100 MG TOTAL) BY MOUTH DAILY.   atorvastatin (LIPITOR) 20 MG tablet Take 1 tablet (20 mg total) by mouth daily.   nadolol (CORGARD) 20 MG tablet Take 1 tablet (20 mg total) by mouth daily.   omeprazole (PRILOSEC) 40 MG capsule Take 1 capsule (40 mg total) by mouth daily.   spironolactone (ALDACTONE) 100 MG tablet TAKE 1 TABLET (100 MG TOTAL) BY MOUTH DAILY.   No facility-administered encounter medications on file as of 10/19/2020.     History: Past Medical History:  Diagnosis Date   Alcohol use disorder, moderate, in early remission, dependence ([place]) 11/16/2019   Ascites    Cirrhosis (HCC)    Elevated AFP    Hepatitis C    Incarcerated umbilical hernia [id number]   Inguinal hernia    Portal hypertension (HCC)    Substance abuse ([place])    alcohol abuse  hx   Umbilical hernia    Past Surgical History:  Procedure Laterality Date   INGUINAL HERNIA REPAIR N/A 03/29/2020   Procedure: OPEN UMBILICAL HERNIA REPAIR, BOWEL RESECTION;  Surgeon: Felicie Morn, MD;  Location: [facility];  Service: General;  Laterality: N/A;   IR PARACENTESIS  09/16/2019   IR PARACENTESIS  10/09/2019   IR PARACENTESIS  12/09/2019    Family History  Problem Relation Age of Onset   Stomach cancer Neg Hx    Colon cancer Neg Hx    Esophageal cancer Neg Hx    Pancreatic cancer Neg Hx    Colon polyps Neg Hx    Rectal cancer Neg Hx    Social History   Occupational History   Not on file  Tobacco Use   Smoking status: Never   Smokeless tobacco: Never  Vaping Use   Vaping Use: Never used  Substance and Sexual  Activity   Alcohol use: Not Currently    Comment: sober since May 2021   Drug use: Not Currently   Sexual activity: Not Currently    Partners: Male   Tobacco Counseling Counseling given: Not AnsweredNon smoker   Immunizations and Health Maintenance Immunization History  Administered Date(s) Administered   Hepatitis A, Adult 11/04/2019   Hepatitis B, adult 09/17/2019, 11/04/2019, 12/04/2019   Influenza,inj,Quad PF,6+ Mos 01/05/2020, 10/19/2020   Moderna Sars-Covid-2 Vaccination 07/13/2019, 08/10/2019   Pneumococcal Conjugate-13 01/28/2020   Tdap 02/11/2020   Health Maintenance Due  Topic Date Due   Zoster Vaccines- Shingrix (1 of 2) Never done   COVID-19 Vaccine (3 - Booster for Moderna series) 01/10/2020    Activities of Daily Living In your present state of health, do you have any difficulty performing the following activities: 10/19/2020 03/29/2020  Hearing? N -  Vision? N -  Difficulty concentrating or making decisions? N -  Walking or climbing stairs? N -  Dressing or bathing? N -  Doing errands, shopping? N N  Preparing Food and eating ? N -  Using the Toilet? N -  In the past six months, have you accidently leaked urine? N -  Do you have problems with loss of bowel control? N -  Managing your Medications? N -  Managing your Finances? N -  Housekeeping or managing your Housekeeping? N -  Some recent data might be hidden    Physical Exam   Gen: Pleasant, well-nourished, in no distress,  normal affect  ENT: No lesions,  mouth clear,  oropharynx clear, no postnasal drip  Neck: No JVD, no TMG, no carotid bruits  Lungs: No use of accessory muscles, no dullness to percussion, clear without rales or rhonchi  Cardiovascular: RRR, heart sounds normal, no murmur or gallops, no peripheral edema  Abdomen: soft and NT, no HSM,  BS normal, inguinal hernia observed  Musculoskeletal: No deformities, no cyanosis or clubbing  Neuro: alert, non focal  Skin: Warm, no  lesions or rashes  Advanced Directives: Does Patient Have a Medical Advance Directive?: No Would patient like information on creating a medical advance directive?: YES Patient has decided to obtain an advance care planning document I went over this with him in detail and he decided to process this  -   Assessment:    This is a routine wellness  examination for this patient .    Vision/Hearing screen Vision Screening   Right eye Left eye Both eyes  Without correction     With correction '20 15 20 15 20 '$ 20  Dietary issues and exercise activities discussed: Patient to follow a healthy diet    No goals needed   Depression Screen PHQ 2/9 Scores 10/19/2020 07/19/2020 05/11/2020 01/05/2020  PHQ - 2 Score 0 0 0 0  PHQ- 9 Score - - - 0     Fall Risk Fall Risk  10/19/2020  Falls in the past year? 0  Number falls in past yr: 0  Injury with Fall? 0  Risk for fall due to : No Fall Risks  Follow up -    Cognitive Function MMSE - Mini Mental State Exam 10/19/2020  Orientation to time 5  Orientation to Place 5  Registration 3  Attention/ Calculation 5  Recall 3  Language- name 2 objects 2  Language- repeat 1  Language- follow 3 step command 3  Language- read & follow direction 1  Write a sentence 1  Copy design 0  Total score 29        Patient Care Team: Elsie Stain, MD as PCP - General (Pulmonary Disease)     Plan:  I personally reviewed all images and lab data in the [facility] system as well as any outside material available during this office visit and agree with the  radiology impressions.   Hypertension Stable at this time no change in medications  Portal hypertension (HCC) Maintain current medications including Aldactone and Corgard  Liver cirrhosis, alcoholic (HCC) Compensated  Chronic hepatitis C without hepatic coma ([employer]) Has completed course of therapy  Ascites of liver Ascites stable  Inguinal hernia Planning surgical approach  Thrombocytopenia  ([place]) Improved on recent lab assessment  Muhammadali was seen today for medicare wellness.  Diagnoses and all orders for this visit:  Encounter for Medicare annual wellness exam  Need for immunization against influenza -     Flu Vaccine QUAD 36moIM (Fluarix, Fluzone & Alfiuria Quad PF)  Alcoholic cirrhosis, unspecified whether ascites present (HCC) -     nadolol (CORGARD) 20 MG tablet; Take 1 tablet (20 mg total) by mouth daily. -     omeprazole (PRILOSEC) 40 MG capsule; Take 1 capsule (40 mg total) by mouth daily.  Gastritis, presence of bleeding unspecified, unspecified chronicity, unspecified gastritis type -     omeprazole (PRILOSEC) 40 MG capsule; Take 1 capsule (40 mg total) by mouth daily.  Primary hypertension  Portal hypertension (HCC)  Alcoholic cirrhosis of liver without ascites (HCC)  Chronic hepatitis C without hepatic coma (HCC)  Ascites of liver  Bilateral recurrent inguinal hernia without obstruction or gangrene  Thrombocytopenia (HCC)  Other orders -     atorvastatin (LIPITOR) 20 MG tablet; Take 1 tablet (20 mg total) by mouth daily. -     spironolactone (ALDACTONE) 100 MG tablet; TAKE 1 TABLET (100 MG TOTAL) BY MOUTH DAILY.   I have personally reviewed and noted the following in the patient's chart:   Medical and social history Use of alcohol, tobacco or illicit drugs  Current medications and supplements Functional ability and status Nutritional status Physical activity Advanced directives List of other physicians Hospitalizations, surgeries, and ER visits in previous 12 months Vitals Screenings to include cognitive, depression, and falls Referrals and appointments  In addition, I have reviewed and discussed with patient certain preventive protocols, quality metrics, and best practice recommendations. A written personalized care plan for preventive services as well as general preventive health recommendations were provided to patient.    PAsencion Noble MD 10/19/2020

## 2020-10-19 NOTE — Patient Instructions (Addendum)
You have 3 refills on all your medications  Flu vaccine was given  Consider getting a COVID booster vaccine at any drugstore  We discussed you getting a shingles vaccine series at any drugstore  No change in medication dosing  Advance care plan packet given when she execute that bring a copy to [place] we will scan it into your chart  Continue to follow a healthy diet as you are doing  Return to see Dr. Joya Gaskins 5 months

## 2020-10-19 NOTE — Assessment & Plan Note (Signed)
Has completed course of therapy

## 2020-10-19 NOTE — Assessment & Plan Note (Signed)
Compensated 

## 2020-10-19 NOTE — Assessment & Plan Note (Signed)
Stable at this time no change in medications

## 2020-10-20 ENCOUNTER — Ambulatory Visit (INDEPENDENT_AMBULATORY_CARE_PROVIDER_SITE_OTHER): Payer: Medicare Other | Admitting: Gastroenterology

## 2020-10-20 DIAGNOSIS — Z23 Encounter for immunization: Secondary | ICD-10-CM

## 2020-10-26 ENCOUNTER — Other Ambulatory Visit: Payer: Self-pay

## 2020-11-02 ENCOUNTER — Other Ambulatory Visit: Payer: Self-pay

## 2020-11-03 ENCOUNTER — Other Ambulatory Visit: Payer: Self-pay

## 2020-11-04 ENCOUNTER — Other Ambulatory Visit: Payer: Self-pay

## 2020-11-21 ENCOUNTER — Ambulatory Visit (INDEPENDENT_AMBULATORY_CARE_PROVIDER_SITE_OTHER): Payer: Medicare Other | Admitting: Gastroenterology

## 2020-11-21 DIAGNOSIS — B182 Chronic viral hepatitis C: Secondary | ICD-10-CM

## 2020-11-21 DIAGNOSIS — Z23 Encounter for immunization: Secondary | ICD-10-CM | POA: Diagnosis not present

## 2021-01-04 ENCOUNTER — Ambulatory Visit: Payer: Medicare Other | Admitting: Gastroenterology

## 2021-01-10 ENCOUNTER — Telehealth: Payer: Self-pay

## 2021-01-10 NOTE — Telephone Encounter (Signed)
Left message for patient to call back  

## 2021-01-10 NOTE — Telephone Encounter (Signed)
-----   Message from Timothy Lasso, RN sent at 07/11/2020  9:46 AM EDT ----- set up a 54-month follow-up ultrasound for [facility] screening  Thanks.  GM

## 2021-01-11 NOTE — Telephone Encounter (Signed)
Attempted to reach patient, line rang but went to busy.

## 2021-01-12 NOTE — Telephone Encounter (Signed)
Third attempt, no answer. Left voicemail for pt to return call. Letter will be sent to pt.

## 2021-01-26 ENCOUNTER — Other Ambulatory Visit: Payer: Self-pay

## 2021-01-26 ENCOUNTER — Ambulatory Visit (HOSPITAL_COMMUNITY)
Admission: RE | Admit: 2021-01-26 | Discharge: 2021-01-26 | Disposition: A | Discharge status: Home / Self Care | Payer: Medicare Other | Source: Ambulatory Visit | Attending: Gastroenterology | Admitting: Gastroenterology

## 2021-01-26 DIAGNOSIS — Z8719 Personal history of other diseases of the digestive system: Secondary | ICD-10-CM | POA: Diagnosis present

## 2021-01-26 DIAGNOSIS — K7460 Unspecified cirrhosis of liver: Secondary | ICD-10-CM | POA: Insufficient documentation

## 2021-01-26 DIAGNOSIS — B182 Chronic viral hepatitis C: Secondary | ICD-10-CM | POA: Diagnosis present

## 2021-01-26 LAB — POCT I-STAT CREATININE: Creatinine, Ser: 1.2 mg/dL (ref 0.61–1.24)

## 2021-01-26 MED ORDER — IOHEXOL 350 MG/ML SOLN
80.0000 mL | Freq: Once | INTRAVENOUS | Status: AC | PRN
  Administered 2021-01-26: 09:00:00 75 mL via INTRAVENOUS

## 2021-01-26 MED ORDER — SODIUM CHLORIDE (PF) 0.9 % IJ SOLN
INTRAMUSCULAR | Status: AC
  Filled 2021-01-26: qty 50

## 2021-02-01 ENCOUNTER — Ambulatory Visit (INDEPENDENT_AMBULATORY_CARE_PROVIDER_SITE_OTHER): Payer: Medicare Other | Admitting: Gastroenterology

## 2021-02-01 ENCOUNTER — Encounter: Payer: Self-pay | Admitting: Gastroenterology

## 2021-02-01 ENCOUNTER — Other Ambulatory Visit (INDEPENDENT_AMBULATORY_CARE_PROVIDER_SITE_OTHER): Payer: Medicare Other

## 2021-02-01 VITALS — BP 130/78 | HR 60 | Ht 69.5 in | Wt 215.0 lb

## 2021-02-01 DIAGNOSIS — K7460 Unspecified cirrhosis of liver: Secondary | ICD-10-CM

## 2021-02-01 DIAGNOSIS — R935 Abnormal findings on diagnostic imaging of other abdominal regions, including retroperitoneum: Secondary | ICD-10-CM | POA: Diagnosis not present

## 2021-02-01 DIAGNOSIS — K4021 Bilateral inguinal hernia, without obstruction or gangrene, recurrent: Secondary | ICD-10-CM | POA: Diagnosis not present

## 2021-02-01 DIAGNOSIS — Z23 Encounter for immunization: Secondary | ICD-10-CM

## 2021-02-01 LAB — COMPREHENSIVE METABOLIC PANEL
ALT: 25 U/L (ref 0–53)
AST: 23 U/L (ref 0–37)
Albumin: 4.1 g/dL (ref 3.5–5.2)
Alkaline Phosphatase: 82 U/L (ref 39–117)
BUN: 18 mg/dL (ref 6–23)
CO2: 26 mEq/L (ref 19–32)
Calcium: 9.7 mg/dL (ref 8.4–10.5)
Chloride: 105 mEq/L (ref 96–112)
Creatinine, Ser: 1.15 mg/dL (ref 0.40–1.50)
GFR: 66.29 mL/min (ref >60.00)
Glucose, Bld: 93 mg/dL (ref 70–99)
Potassium: 4.5 mEq/L (ref 3.5–5.1)
Sodium: 138 mEq/L (ref 135–145)
Total Bilirubin: 1 mg/dL (ref 0.2–1.2)
Total Protein: 7.7 g/dL (ref 6.0–8.3)

## 2021-02-01 LAB — CBC
HCT: 42.5 % (ref 39.0–52.0)
Hemoglobin: 14 g/dL (ref 13.0–17.0)
MCHC: 33 g/dL (ref 30.0–36.0)
MCV: 94.1 fl (ref 78.0–100.0)
Platelets: 99 10*3/uL — ABNORMAL LOW (ref 150.0–400.0)
RBC: 4.52 Mil/uL (ref 4.22–5.81)
RDW: 15.2 % (ref 11.5–15.5)
WBC: 6.8 10*3/uL (ref 4.0–10.5)

## 2021-02-01 NOTE — Patient Instructions (Signed)
If you are age 66 or older, your body mass index should be between 23-30. Your Body mass index is 31.29 kg/m. If this is out of the aforementioned range listed, please consider follow up with your Primary Care Provider.  If you are age 23 or younger, your body mass index should be between 19-25. Your Body mass index is 31.29 kg/m. If this is out of the aformentioned range listed, please consider follow up with your Primary Care Provider.   ________________________________________________________  The [place] [facility] providers would like to encourage you to use [facility] to communicate with providers for non-urgent requests or questions.  Due to long hold times on the telephone, sending your provider a message by [facility] may be a faster and more efficient way to get a response.  Please allow 48 business hours for a response.  Please remember that this is for non-urgent requests.  _______________________________________________________  Your provider has requested that you go to the basement level for lab work before leaving today. Press "B" on the elevator. The lab is located at the first door on the left as you exit the elevator.  Monitor weight 2-3 times per week if increase in swelling in legs please send mychart message.  It was a pleasure to see you today!  Thank you for trusting me with your gastrointestinal care!

## 2021-02-01 NOTE — Progress Notes (Signed)
[place] VISIT   Primary Care Provider Elsie Stain, MD [address] [place] [address] [phone]   Patient Profile: Richard Yang is a 65 y.o. male with a pmh significant for cirrhosis secondary to [facility] and alcohol (complicated by portal hypertension manifested as esophageal varices, portal hypertensive gastropathy, previous ascites), chronic HCV (status posttreatment), previous umbilical hernia (status post surgical intervention), bilateral inguinal hernias, cholelithiasis, hyperlipidemia.  The patient presents to the [facility] for an evaluation and management of problem(s) noted below:  Problem List 1. Hepatic cirrhosis, secondary to HCV/alcohol, prior history of ascites currently stable ([place])   2. Bilateral recurrent inguinal hernia without obstruction or gangrene   3. Abnormal CT scan, pelvis   4. Need for prophylactic vaccination and inoculation against viral hepatitis      History of Present Illness Please see prior notes for full details of HPI.  Interval History The patient returns today for scheduled follow-up.  Overall he has been doing well.  He has had some slight weight gain which he attributes to not eating as well as normal.  The patient underwent a CT abdomen/pelvis for [facility] screening as well as further evaluation of some abdominal discomforts that he has previously had.  The patient has completed his hepatitis A hepatitis B vaccinations but has not had his labs rechecked to see if he gained immunity.    The patient denies any issues with jaundice, scleral icterus, generalized pruritus, darkened/amber urine, clay-colored stools, LE edema, hematemesis, coffee-ground emesis, abdominal distention, confusion.  GI Review of Systems Positive as above Negative for odynophagia, dysphagia, nausea, vomiting, pain, alteration of bowel habits, melena, hematochezia   Review of Systems General: Denies  fevers/chills/weight loss unintentionally Cardiovascular: Denies chest pain Pulmonary: Denies shortness of breath Gastroenterological: See HPI Genitourinary: Denies darkened urine Hematological: Denies easy bruising/bleeding Dermatological: Denies jaundice Psychological: Mood is stable   Medications Current Outpatient Medications  Medication Sig Dispense Refill   atorvastatin (LIPITOR) 20 MG tablet Take 1 tablet (20 mg total) by mouth daily. 90 tablet 3   nadolol (CORGARD) 20 MG tablet Take 1 tablet (20 mg total) by mouth daily. 30 tablet 6   omeprazole (PRILOSEC) 40 MG capsule Take 1 capsule (40 mg total) by mouth daily. 30 capsule 5   polyethylene glycol (MIRALAX / GLYCOLAX) 17 g packet Take 17 g by mouth daily as needed. 14 each 0   spironolactone (ALDACTONE) 100 MG tablet TAKE 1 TABLET (100 MG TOTAL) BY MOUTH DAILY. 90 tablet 2   docusate sodium (COLACE) 100 MG capsule Take 1 capsule (100 mg total) by mouth 2 (two) times daily. (Patient taking differently: Take 100 mg by mouth as needed.) 10 capsule 0   No current facility-administered medications for this visit.    Allergies No Known Allergies  Histories Past Medical History:  Diagnosis Date   Alcohol use disorder, moderate, in early remission, dependence ([place]) 11/16/2019   Ascites    Cirrhosis (HCC)    Elevated AFP    Hepatitis C    Incarcerated umbilical hernia 3/64/6803   Inguinal hernia    Portal hypertension (HCC)    Substance abuse ([place])    alcohol abuse hx   Umbilical hernia    Past Surgical History:  Procedure Laterality Date   INGUINAL HERNIA REPAIR N/A 03/29/2020   Procedure: OPEN UMBILICAL HERNIA REPAIR, BOWEL RESECTION;  Surgeon: Felicie Morn, MD;  Location: [facility];  Service: General;  Laterality: N/A;   IR PARACENTESIS  09/16/2019  IR PARACENTESIS  10/09/2019   IR PARACENTESIS  12/09/2019   Social History   Socioeconomic History   Marital status: Single    Spouse name: Not on file   Number  of children: Not on file   Years of education: Not on file   Highest education level: Not on file  Occupational History   Not on file  Tobacco Use   Smoking status: Never   Smokeless tobacco: Never  Vaping Use   Vaping Use: Never used  Substance and Sexual Activity   Alcohol use: Not Currently    Comment: sober since May 2021   Drug use: Not Currently   Sexual activity: Not Currently    Partners: Male  Other Topics Concern   Not on file  Social History Narrative   Not on file   Social Determinants of Health   Financial Resource Strain: Not on file  Food Insecurity: Not on file  Transportation Needs: Not on file  Physical Activity: Not on file  Stress: Not on file  Social Connections: Not on file  Intimate Partner Violence: Not on file   Family History  Problem Relation Age of Onset   Stomach cancer Neg Hx    Colon cancer Neg Hx    Esophageal cancer Neg Hx    Pancreatic cancer Neg Hx    Colon polyps Neg Hx    Rectal cancer Neg Hx    I have reviewed his medical, social, and family history in detail and updated the electronic medical record as necessary.    PHYSICAL EXAMINATION  BP 130/78    Pulse 60    Ht 5' 9.5" (1.765 m)    Wt 215 lb (97.5 kg)    BMI 31.29 kg/m  Wt Readings from Last 3 Encounters:  02/01/21 215 lb (97.5 kg)  10/19/20 205 lb 12.8 oz (93.4 kg)  09/30/20 205 lb 2 oz (93 kg)  GEN: NAD, appears stated age, doesn't appear chronically ill PSYCH: Cooperative, without pressured speech EYE: Conjunctivae pink, sclerae anicteric ENT: MMM CV: Nontachycardic RESP: No audible wheezing GI: NABS, soft, protuberant abdomen, no shifting dullness appreciated, NT/ND, without rebound MSK/EXT: 1/2+ pedal edema bilaterally SKIN: No jaundice, no spider angiomata NEURO:  Alert & Oriented x 3, no focal deficits, no evidence of asterixis   REVIEW OF DATA  I reviewed the following data at the time of this encounter:  GI Procedures and Studies  Previously  reviewed  Laboratory Studies  Reviewed those in epic and care everywhere  Imaging Studies  December 2022 CTAP IMPRESSION: No acute findings within the abdomen or pelvis. Hepatic cirrhosis and findings of portal venous hypertension. No evidence of hepatic neoplasm. Cholelithiasis. No radiographic evidence of cholecystitis. Stable moderately enlarged prostate and findings of chronic bladder outlet obstruction. Stable small left inguinal hernia, and moderate right inguinal hernia containing fat and fluid. Decreased size of small umbilical hernia, which contains only fat.   ASSESSMENT  Richard Yang is a 65 y.o. male with a pmh significant for cirrhosis secondary to [facility] and alcohol (complicated by portal hypertension manifested as esophageal varices, portal hypertensive gastropathy, previous ascites), chronic HCV (status posttreatment), previous umbilical hernia (status post surgical intervention), bilateral inguinal hernias, cholelithiasis, hyperlipidemia.  The patient is seen today for evaluation and management of:  1. Hepatic cirrhosis, secondary to HCV/alcohol, prior history of ascites currently stable ([place])   2. Bilateral recurrent inguinal hernia without obstruction or gangrene   3. Abnormal CT scan, pelvis   4. Need for prophylactic vaccination  and inoculation against viral hepatitis    Our patient is hemodynamically and clinically stable at this time.  He remains compensated from a cirrhosis standpoint.  We will continue his current beta-blockade as well as current diuretic dosing.  We will see if the patient has gained immunity to hepatitis A and hepatitis B after his vaccinations.  He will continue to monitor his weight which has gone up slightly as a result of the holidays.  There is some slight pedal edema bilaterally but the patient states that he will be monitoring his weight and salt intake much more closely at this time.  Without any overt ascites on abdominal exam and on recent CT  I think is okay to maintain his current diuretics but this may need to be changed depending on how the patient is doing.  We went over the CT scan results and he will follow-up with his PCP about the potential for chronic bladder obstruction (he wakes up 2 times at night to use the restroom but does not describe issues of stranguria or severe nocturia).  All patient questions were answered to the best of my ability, and the patient agrees to the aforementioned plan of action with follow-up as indicated.   PLAN  ESLD Management Volume -Continue spironolactone 100 mg -Obtain daily standing weight and monitor for significant weight changes -1500 mg Na diet Infection -No evidence of SBP Bleeding -Esophageal varices grade 1 and grade 2 noted currently on beta-blockade -Repeat EGD in 2024 or sooner if any decompensation Encephalopathy -None Screening -HCC screening due by May 2023 with ultrasound Transplant -Low meld so not a transplant candidate at this time Vaccination -We will evaluate for HAV and HBV immune status today -Recommend patient discuss with PCP pneumococcal and influenza vaccines  Other -Obtain meld labs today -Promote intake of 1.5 g/kg/day of Protein (Ensures/Boosts at each meal)  Continue PPI daily   Orders Placed This Encounter  Procedures   CBC   Comp Met (CMET)   Hepatitis A antibody, total   Hepatitis B Surface AntiBODY   INR/PT    New Prescriptions   No medications on file   Modified Medications   No medications on file    Planned Follow Up Return in about 3 months (around 05/02/2021).   Total Time in Face-to-Face and in Coordination of Care for patient including independent/personal interpretation/review of prior testing, medical history, examination, medication adjustment, communicating results with the patient directly, and documentation with the EHR is 25 minutes.   Justice Britain, MD [facility] Office #  [phone]

## 2021-02-02 LAB — HEPATITIS A ANTIBODY, TOTAL: Hepatitis A AB,Total: REACTIVE — AB

## 2021-02-02 LAB — PROTIME-INR
INR: 1.1
Prothrombin Time: 10.9 s (ref 9.0–11.5)

## 2021-02-02 LAB — HEPATITIS B SURFACE ANTIBODY,QUALITATIVE: Hep B S Ab: REACTIVE — AB

## 2021-02-06 ENCOUNTER — Other Ambulatory Visit: Payer: Self-pay

## 2021-05-09 ENCOUNTER — Other Ambulatory Visit: Payer: Self-pay

## 2021-05-09 ENCOUNTER — Other Ambulatory Visit: Payer: Self-pay | Admitting: Critical Care Medicine

## 2021-05-09 DIAGNOSIS — K2970 Gastritis, unspecified, without bleeding: Secondary | ICD-10-CM

## 2021-05-09 DIAGNOSIS — K7030 Alcoholic cirrhosis of liver without ascites: Secondary | ICD-10-CM

## 2021-05-10 ENCOUNTER — Other Ambulatory Visit: Payer: Self-pay

## 2021-05-10 MED ORDER — OMEPRAZOLE 40 MG PO CPDR
40.0000 mg | DELAYED_RELEASE_CAPSULE | Freq: Every day | ORAL | 0 refills | Status: DC
  Filled 2021-05-10: qty 30, 30d supply, fill #0

## 2021-05-15 ENCOUNTER — Other Ambulatory Visit: Payer: Self-pay

## 2021-06-01 IMAGING — CT CT ABD-PELV W/ CM
2 of 5 series · 15 of 46 positions shown, 17 images · IV contrast (OMNIPAQUE 300)
Comparison: CT Abdomen and Pelvis 02/17/2020, and earlier.

CLINICAL DATA: 64-year-old male with history of cirrhosis.
Abdominal pain with irreducible umbilical hernia.

EXAM:
CT ABDOMEN AND PELVIS WITH CONTRAST
TECHNIQUE: Multidetector CT imaging of the abdomen and pelvis was performed
using the standard protocol following bolus administration of
intravenous contrast.
CONTRAST:  100mL OMNIPAQUE IOHEXOL 300 MG/ML  SOLN

[Series 2: axial st · axial · 0.79mm/px · z∈[-484,-78]mm · 12 of 97 slices shown, 14 images]
[im 8/97  soft-tissue]
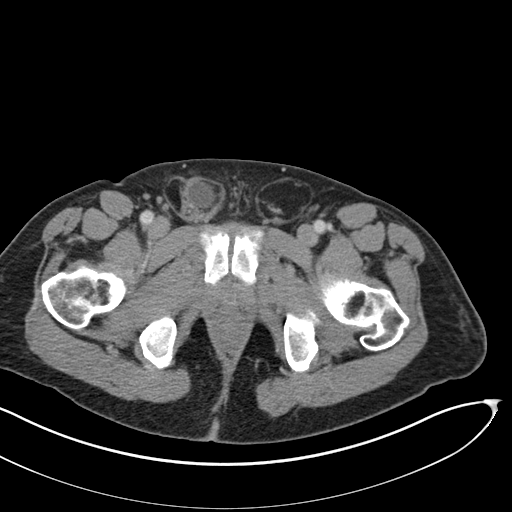
[im 8/97  bone]
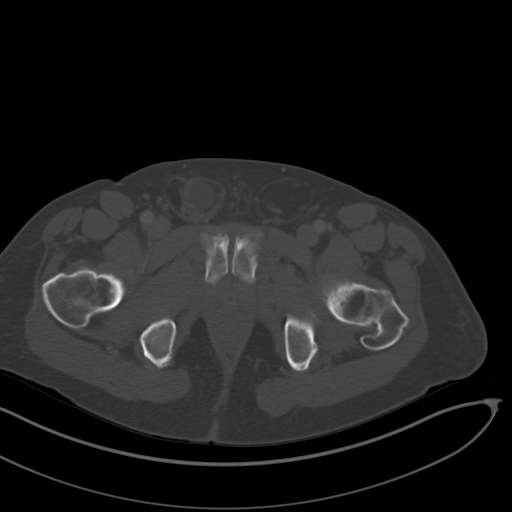
[im 15/97  soft-tissue]
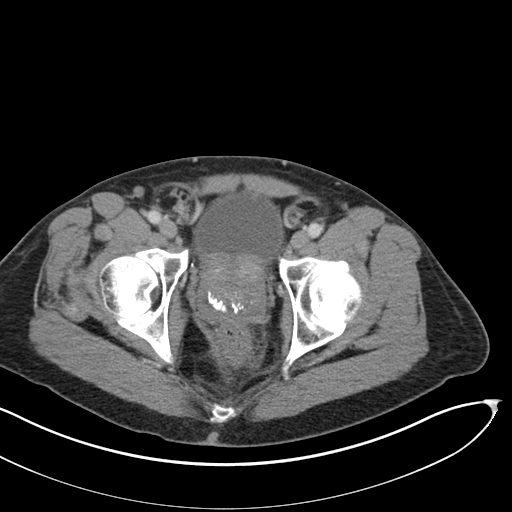
[im 23/97  soft-tissue]
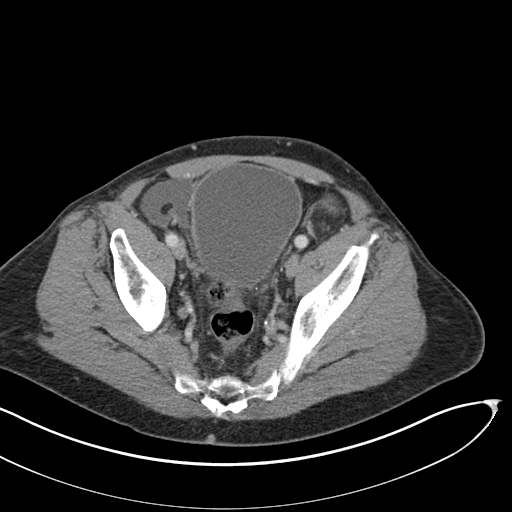
[im 30/97  soft-tissue]
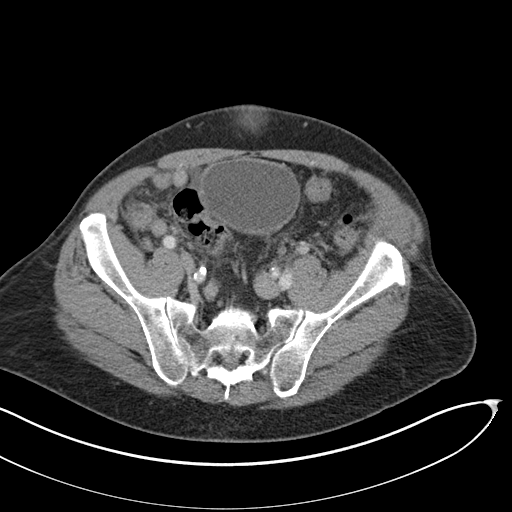
[im 37/97  soft-tissue]
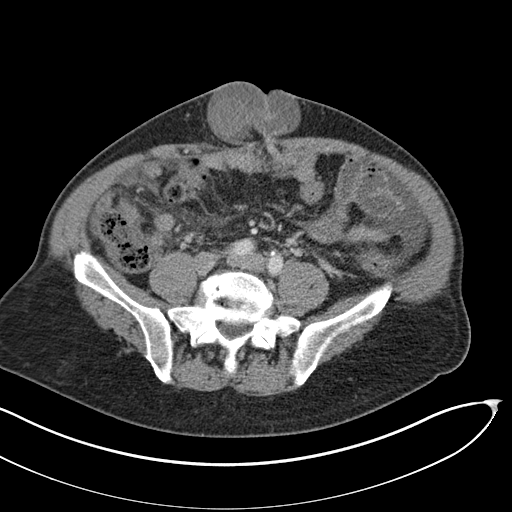
[im 45/97  soft-tissue]
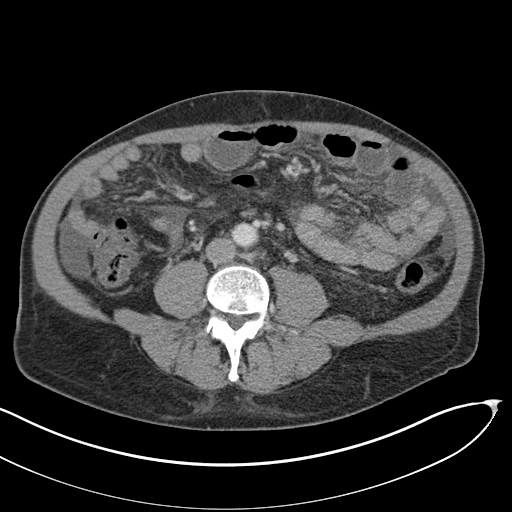
[im 52/97  soft-tissue]
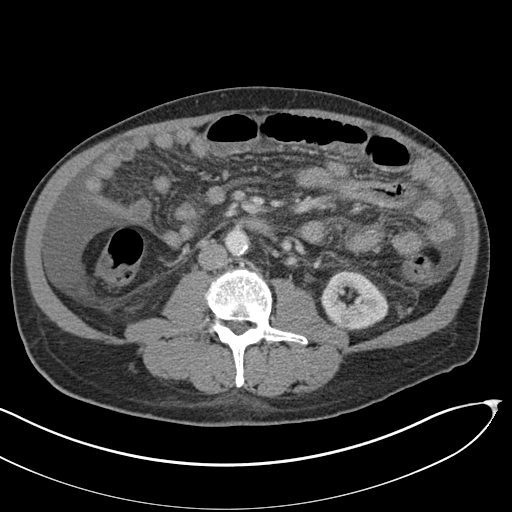
[im 60/97  soft-tissue]
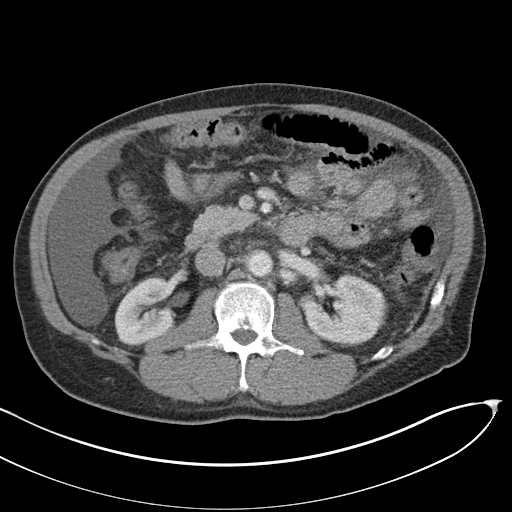
[im 67/97  soft-tissue]
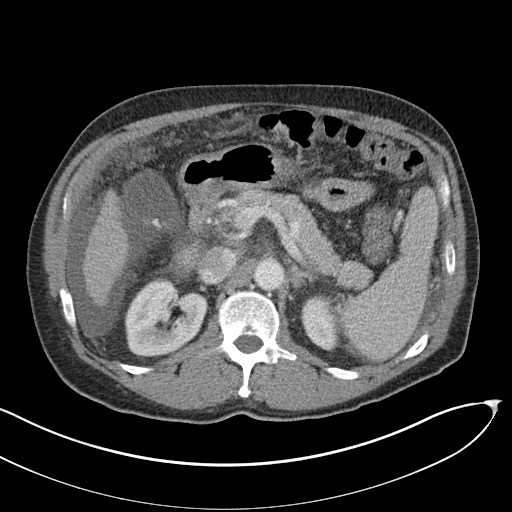
[im 67/97  bone]
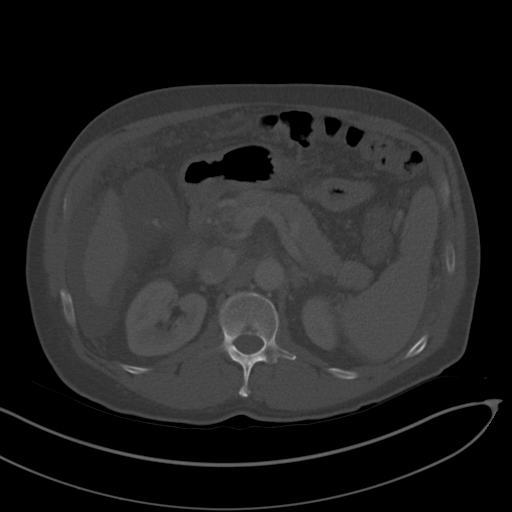
[im 74/97  soft-tissue]
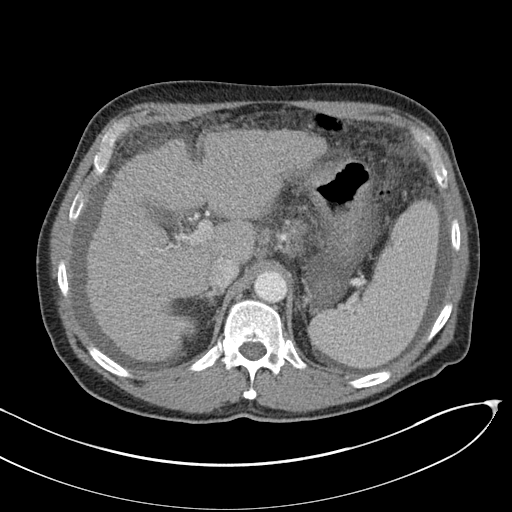
[im 82/97  soft-tissue]
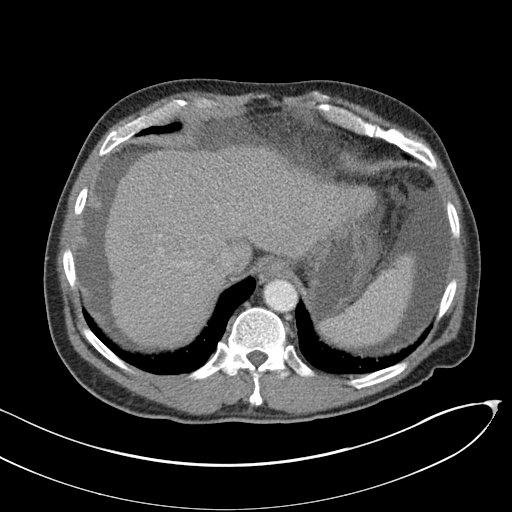
[im 89/97  soft-tissue]
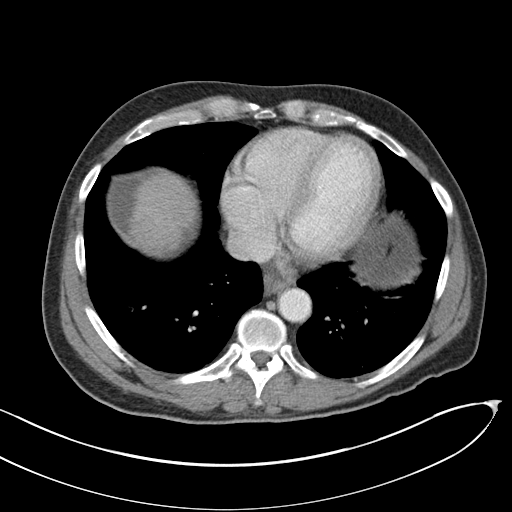

[Series 4: coronal st · coronal · 0.81mm/px · 3 of 146 slices shown]
[im 49/146  soft-tissue]
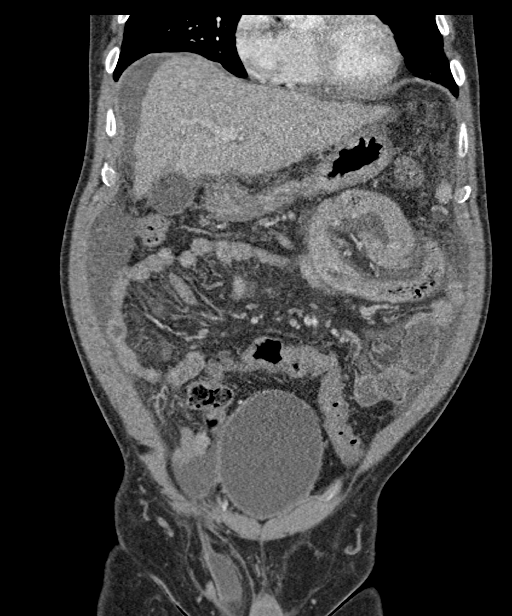
[im 65/146  soft-tissue]
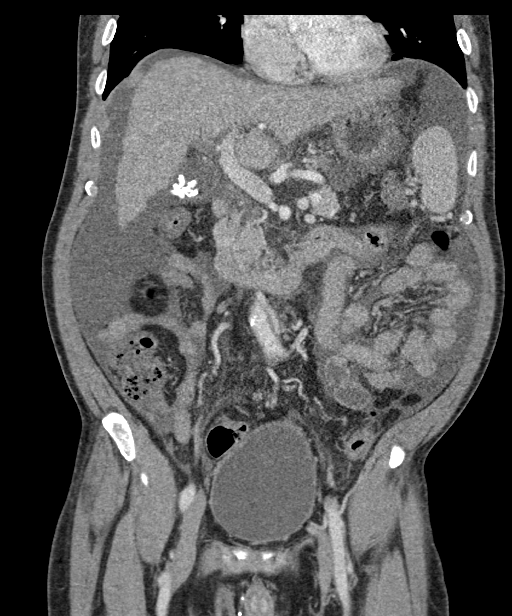
[im 81/146  soft-tissue]
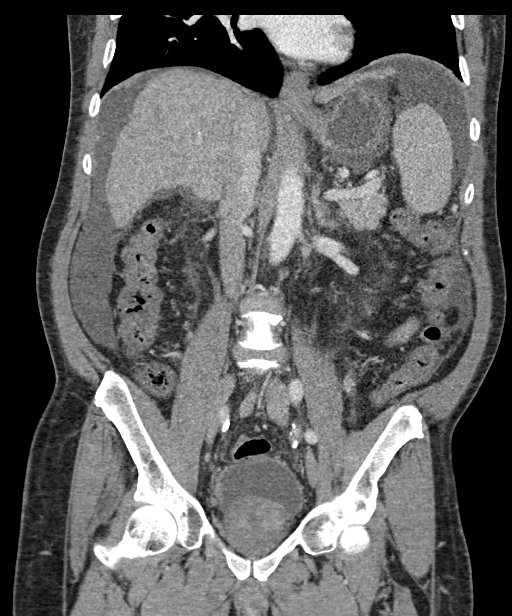

[15 of 46 positions shown; findings below may reference images not displayed]

FINDINGS: Lower chest: Stable mild lung base scarring or atelectasis. No
pleural effusion.

Hepatobiliary: Cirrhotic liver, cholelithiasis, and perihepatic
ascites appears stable from last month. No discrete liver lesion.

Pancreas: Negative.

Spleen: Stable mild splenomegaly, perisplenic ascites.

Adrenals/Urinary Tract: Stable and negative adrenal glands, kidneys.
Symmetric renal contrast enhancement. Urinary bladder more distended
today, estimated bladder volume 330 mL.

Stomach/Bowel: Bilobed umbilical hernia containing a small volume of
ascites fluid plus a single loop of small bowel (series 2, images 59
and 60) is stable and size and configuration from last month
measuring about 7.3 cm diameter with hernia neck of about 12 mm. No
surrounding inflammatory stranding.

Small volume ascites is stable from last month.

No dilated large bowel. Normal appendix suspected on series 2, image
69.

Mid abdominal small bowel loops are air and fluid-filled and
measuring at the upper limits of normal as in [REDACTED]. And this
appearance does extend to the umbilical hernia (series 2, images
54-59) with decompressed downstream loops. Still, the jejunum is not
significantly dilated. Stomach and duodenum rema[REDACTED]ompressed. No
free air.

Vascular/Lymphatic: Aortoiliac calcified atherosclerosis. Major
arterial structures remain patent. Portal venous system is patent.
Small volume mesenteric varices. No lymphadenopathy.

Reproductive: Chronic inguinal hernias, containing fat on the left
and fat plus a small volume of ascites on the right (decreased from
last month series 2, image 92).

Other: No significant pelvic ascites.

Musculoskeletal: Chronic unhealed right 8th rib fracture. No acute
osseous abnormality identified.
IMPRESSION: 1. Unchanged CT appearance from last month with constellation
suspicious for partial small bowel obstruction secondary to a mildly
incarcerated umbilical hernia containing a single small bowel loop
and small volume ascites.
No discrete bowel inflammation. Hernia size roughly 7 cm diameter
with a 1.2 cm hernia neck.

2. Stable cirrhosis, ascites, and other stigmata of portal venous
hypertension.

3. Urinary bladder more distended today, estimated volume 330 mL.

4. Chronic cholelithiasis, inguinal hernias, Aortic Atherosclerosis
([id number]).

## 2021-06-26 ENCOUNTER — Other Ambulatory Visit: Payer: Self-pay | Admitting: Critical Care Medicine

## 2021-06-26 DIAGNOSIS — K7030 Alcoholic cirrhosis of liver without ascites: Secondary | ICD-10-CM

## 2021-06-26 DIAGNOSIS — K2970 Gastritis, unspecified, without bleeding: Secondary | ICD-10-CM

## 2021-06-27 ENCOUNTER — Other Ambulatory Visit: Payer: Self-pay

## 2021-06-27 MED ORDER — OMEPRAZOLE 40 MG PO CPDR
40.0000 mg | DELAYED_RELEASE_CAPSULE | Freq: Every day | ORAL | 0 refills | Status: DC
  Filled 2021-06-27: qty 30, 30d supply, fill #0

## 2021-06-27 MED ORDER — NADOLOL 20 MG PO TABS
20.0000 mg | ORAL_TABLET | Freq: Every day | ORAL | 0 refills | Status: DC
  Filled 2021-06-27: qty 30, 30d supply, fill #0

## 2021-06-27 NOTE — Telephone Encounter (Signed)
Requested Prescriptions  Pending Prescriptions Disp Refills  . omeprazole (PRILOSEC) 40 MG capsule 30 capsule 0    Sig: Take 1 capsule (40 mg total) by mouth daily.     Gastroenterology: Proton Pump Inhibitors Failed - 06/26/2021 10:58 AM      Failed - Valid encounter within last 12 months    Recent Outpatient Visits          8 months ago Encounter for Medicare annual wellness exam   [place] Elsie Stain, MD   1 year ago Chronic hepatitis C without hepatic coma [facility])   [place] Elsie Stain, MD   1 year ago Need for Tdap vaccination   [place], RPH-CPP   1 year ago Unilateral recurrent inguinal hernia without obstruction or gangrene   [place] Elsie Stain, MD   1 year ago Need for vaccination against hepatitis [place], RPH-CPP      Future Appointments            In 2 days Elsie Stain, MD [place]           . nadolol (CORGARD) 20 MG tablet 30 tablet 0    Sig: Take 1 tablet (20 mg total) by mouth daily.     Cardiovascular: Beta Blockers 2 Failed - 06/26/2021 10:58 AM      Failed - Valid encounter within last 6 months    Recent Outpatient Visits          8 months ago Encounter for Medicare annual wellness exam   [place] Elsie Stain, MD   1 year ago Chronic hepatitis C without hepatic coma [facility])   [place] Elsie Stain, MD   1 year ago Need for Tdap vaccination   [place], RPH-CPP   1 year ago Unilateral recurrent inguinal hernia without obstruction or gangrene   [place] Elsie Stain, MD   1 year ago Need for vaccination against hepatitis [place], RPH-CPP      Future Appointments            In 2 days Elsie Stain, MD [place] in normal range and within 360 days    Creat  Date Value Ref Range Status  01/04/2020 1.17 0.70 - 1.25 mg/dL Final    Comment:    For patients >51 years of age, the reference limit for Creatinine is approximately 13% higher for people identified as African-American. .    Creatinine, Ser  Date Value Ref Range Status  02/01/2021 1.15 0.40 - 1.50 mg/dL Final         Passed - Last BP in normal range    BP Readings from Last 1 Encounters:  02/01/21 130/78         Passed - Last Heart Rate in normal range    Pulse Readings from Last 1 Encounters:  02/01/21 60

## 2021-06-29 ENCOUNTER — Other Ambulatory Visit: Payer: Self-pay

## 2021-06-29 ENCOUNTER — Ambulatory Visit: Payer: Medicare Other | Attending: Critical Care Medicine | Admitting: Critical Care Medicine

## 2021-06-29 ENCOUNTER — Encounter: Payer: Self-pay | Admitting: Critical Care Medicine

## 2021-06-29 VITALS — BP 180/92 | HR 76 | Temp 98.6°F | Resp 16 | Wt 219.0 lb

## 2021-06-29 DIAGNOSIS — K4021 Bilateral inguinal hernia, without obstruction or gangrene, recurrent: Secondary | ICD-10-CM

## 2021-06-29 DIAGNOSIS — Z79899 Other long term (current) drug therapy: Secondary | ICD-10-CM | POA: Insufficient documentation

## 2021-06-29 DIAGNOSIS — F1010 Alcohol abuse, uncomplicated: Secondary | ICD-10-CM

## 2021-06-29 DIAGNOSIS — N50819 Testicular pain, unspecified: Secondary | ICD-10-CM | POA: Diagnosis not present

## 2021-06-29 DIAGNOSIS — K766 Portal hypertension: Secondary | ICD-10-CM | POA: Diagnosis not present

## 2021-06-29 DIAGNOSIS — R188 Other ascites: Secondary | ICD-10-CM

## 2021-06-29 DIAGNOSIS — K7030 Alcoholic cirrhosis of liver without ascites: Secondary | ICD-10-CM | POA: Diagnosis not present

## 2021-06-29 DIAGNOSIS — I700 Atherosclerosis of aorta: Secondary | ICD-10-CM | POA: Diagnosis not present

## 2021-06-29 DIAGNOSIS — Z23 Encounter for immunization: Secondary | ICD-10-CM | POA: Diagnosis not present

## 2021-06-29 DIAGNOSIS — K2970 Gastritis, unspecified, without bleeding: Secondary | ICD-10-CM | POA: Diagnosis not present

## 2021-06-29 DIAGNOSIS — B182 Chronic viral hepatitis C: Secondary | ICD-10-CM

## 2021-06-29 DIAGNOSIS — I10 Essential (primary) hypertension: Secondary | ICD-10-CM

## 2021-06-29 DIAGNOSIS — R739 Hyperglycemia, unspecified: Secondary | ICD-10-CM | POA: Diagnosis not present

## 2021-06-29 DIAGNOSIS — D696 Thrombocytopenia, unspecified: Secondary | ICD-10-CM | POA: Diagnosis not present

## 2021-06-29 MED ORDER — OMEPRAZOLE 40 MG PO CPDR
40.0000 mg | DELAYED_RELEASE_CAPSULE | Freq: Every day | ORAL | 0 refills | Status: DC
  Filled 2021-06-29 – 2021-07-31 (×4): qty 30, 30d supply, fill #0

## 2021-06-29 MED ORDER — NADOLOL 80 MG PO TABS
80.0000 mg | ORAL_TABLET | Freq: Every day | ORAL | 3 refills | Status: DC
  Filled 2021-06-29: qty 60, 60d supply, fill #0
  Filled 2021-08-24: qty 60, 60d supply, fill #1

## 2021-06-29 MED ORDER — ATORVASTATIN CALCIUM 20 MG PO TABS
20.0000 mg | ORAL_TABLET | Freq: Every day | ORAL | 3 refills | Status: DC
  Filled 2021-06-29 – 2021-07-31 (×4): qty 90, 90d supply, fill #0
  Filled 2021-11-27: qty 90, 90d supply, fill #1

## 2021-06-29 MED ORDER — SPIRONOLACTONE 100 MG PO TABS
ORAL_TABLET | Freq: Every day | ORAL | 2 refills | Status: DC
  Filled 2021-06-29: qty 90, fill #0
  Filled 2021-07-31 (×3): qty 90, 90d supply, fill #0
  Filled 2021-08-24: qty 90, 90d supply, fill #1

## 2021-06-29 MED ORDER — AMLODIPINE BESYLATE 10 MG PO TABS
5.0000 mg | ORAL_TABLET | Freq: Every day | ORAL | 1 refills | Status: DC
  Filled 2021-06-29: qty 45, 90d supply, fill #0
  Filled 2021-08-24: qty 45, 90d supply, fill #1

## 2021-06-29 MED ORDER — FUROSEMIDE 20 MG PO TABS
20.0000 mg | ORAL_TABLET | Freq: Every day | ORAL | 3 refills | Status: DC | PRN
  Filled 2021-06-29: qty 30, 30d supply, fill #0
  Filled 2021-07-31 (×3): qty 30, 30d supply, fill #1
  Filled 2021-08-24: qty 30, 30d supply, fill #2

## 2021-06-29 NOTE — Assessment & Plan Note (Signed)
Mild to moderate ascites not severe enough to require paracentesis we will monitor and increase diuretic program

## 2021-06-29 NOTE — Progress Notes (Signed)
Established Patient Office Visit  Subjective   Patient ID: Richard Yang, male    DOB: 1955/02/19  Age: 66 y.o. MRN: [id number]  Chief Complaint  Patient presents with   Groin Pain   Hypertension   lab work    This a 66 year old pleasant male who is seen in return follow-up not seen since September 2022.  Patient unfortunately is gone back to drinking alcohol he drinks about 4 [name] mixed with sugar Coke or Sprite daily.  He states he stays in his apartment which is in the downtown [place].  He states because of stress around him and individuals in the tower who have other unhealthy behaviors he likes to stay inside in his apartment all day.  He becomes bored and at times will then lead to drinking alcohol.  He does not have any good social connections.  He has some family in the area but he is originally from [place] area.  Another issue is that he has groin pain and testicular pain with more swelling.  Another issue is on arrival blood pressure was 180/100.  The patient's only on low-dose enalapril and Aldactone for his cirrhotic liver disease.  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  He has portal hypertension with gastropathy.  He has had no bleeding recently.  Patient does not smoke cigarettes or use any other substances  Patient does have aortic atherosclerosis and is on atorvastatin which will have to be careful with given his liver disease  Patient is due a Prevnar 20 vaccine he agrees to receive this    Patient Active Problem List   Diagnosis Date Noted   Hx of esophageal varices 10/03/2020   Hepatic cirrhosis ([place]) 10/03/2020   Portal hypertensive gastropathy ([place]) 10/03/2020   Other cirrhosis of liver ([place]) 11/16/2019   Ascites of liver 11/16/2019   Chronic hepatitis C without hepatic coma ([place]) 09/23/2019   Portal hypertension ([place]) 08/06/2019   Thrombocytopenia ([place]) 08/06/2019   Inguinal hernia 07/13/2019    Aortic atherosclerosis ([place]) 07/13/2019   History of alcohol use 07/13/2019   Liver cirrhosis, alcoholic ([place]) 67/71/2458   Hypertension 06/24/2019   Past Medical History:  Diagnosis Date   Alcohol use disorder, moderate, in early remission, dependence ([place]) 11/16/2019   Ascites    Cirrhosis (HCC)    Elevated AFP    Hepatitis C    Incarcerated umbilical hernia 0/99/8338   Inguinal hernia    Portal hypertension (HCC)    Substance abuse ([place])    alcohol abuse hx   Umbilical hernia    Past Surgical History:  Procedure Laterality Date   INGUINAL HERNIA REPAIR N/A 03/29/2020   Procedure: OPEN UMBILICAL HERNIA REPAIR, BOWEL RESECTION;  Surgeon: Felicie Morn, MD;  Location: [facility];  Service: General;  Laterality: N/A;   IR PARACENTESIS  09/16/2019   IR PARACENTESIS  10/09/2019   IR PARACENTESIS  12/09/2019   Social History   Tobacco Use   Smoking status: Never   Smokeless tobacco: Never  Vaping Use   Vaping Use: Never used  Substance Use Topics   Alcohol use: Yes   Drug use: Not Currently   Social History   Socioeconomic History   Marital status: Single    Spouse name: Not on file   Number of children: Not on file   Years of education: Not on file   Highest education level: Not on file  Occupational History   Not on file  Tobacco Use   Smoking status: Never   Smokeless tobacco: Never  Vaping Use   Vaping Use: Never used  Substance and Sexual Activity   Alcohol use: Yes   Drug use: Not Currently   Sexual activity: Not Currently    Partners: Male  Other Topics Concern   Not on file  Social History Narrative   Not on file   Social Determinants of Health   Financial Resource Strain: Not on file  Food Insecurity: Not on file  Transportation Needs: Not on file  Physical Activity: Not on file  Stress: Not on file  Social Connections: Not on file  Intimate Partner Violence: Not on file   Family Status  Relation Name Status   Neg Hx  (Not Specified)    Family History  Problem Relation Age of Onset   Stomach cancer Neg Hx    Colon cancer Neg Hx    Esophageal cancer Neg Hx    Pancreatic cancer Neg Hx    Colon polyps Neg Hx    Rectal cancer Neg Hx    No Known Allergies    Review of Systems  Constitutional:  Negative for chills, diaphoresis, fever, malaise/fatigue and weight loss.  HENT:  Negative for congestion, hearing loss, nosebleeds, sore throat and tinnitus.   Eyes:  Negative for blurred vision, photophobia and redness.  Respiratory:  Negative for cough, hemoptysis, sputum production, shortness of breath, wheezing and stridor.   Cardiovascular:  Negative for chest pain, palpitations, orthopnea, claudication, leg swelling and PND.  Gastrointestinal:  Negative for abdominal pain, blood in stool, constipation, diarrhea, heartburn, nausea and vomiting.  Genitourinary:  Negative for dysuria, flank pain, frequency, hematuria and urgency.  Musculoskeletal:  Negative for back pain, falls, joint pain, myalgias and neck pain.  Skin:  Negative for itching and rash.  Neurological:  Negative for dizziness, tingling, tremors, sensory change, speech change, focal weakness, seizures, loss of consciousness, weakness and headaches.  Endo/Heme/Allergies:  Negative for environmental allergies and polydipsia. Does not bruise/bleed easily.  Psychiatric/Behavioral:  Negative for depression, memory loss, substance abuse and suicidal ideas. The patient is not nervous/anxious and does not have insomnia.      Objective:     BP (!) 180/100 (BP Location: Left Arm, Patient Position: Sitting, Cuff Size: Large)   Pulse 76   Temp 98.6 F (37 C) (Oral)   Resp 16   Wt 219 lb (99.3 kg)   SpO2 96%   BMI 31.88 kg/m  BP Readings from Last 3 Encounters:  06/29/21 (!) 180/100  02/01/21 130/78  10/19/20 132/84   Wt Readings from Last 3 Encounters:  06/29/21 219 lb (99.3 kg)  02/01/21 215 lb (97.5 kg)  10/19/20 205 lb 12.8 oz (93.4 kg)      Physical  Exam Vitals reviewed.  Constitutional:      Appearance: Normal appearance. He is well-developed. He is obese. He is not diaphoretic.  HENT:     Head: Normocephalic and atraumatic.     Nose: Nose normal. No nasal deformity, septal deviation, mucosal edema or rhinorrhea.     Right Sinus: No maxillary sinus tenderness or frontal sinus tenderness.     Left Sinus: No maxillary sinus tenderness or frontal sinus tenderness.     Mouth/Throat:     Mouth: Mucous membranes are moist.     Pharynx: Oropharynx is clear. No oropharyngeal exudate.  Eyes:     General: No scleral icterus.    Conjunctiva/sclera: Conjunctivae normal.     Pupils: Pupils are  equal, round, and reactive to light.  Neck:     Thyroid: No thyromegaly.     Vascular: No carotid bruit or JVD.     Trachea: Trachea normal. No tracheal tenderness or tracheal deviation.  Cardiovascular:     Rate and Rhythm: Normal rate and regular rhythm.     Chest Wall: PMI is not displaced.     Pulses: Normal pulses. No decreased pulses.     Heart sounds: Normal heart sounds, S1 normal and S2 normal. Heart sounds not distant. No murmur heard. No systolic murmur is present.  No diastolic murmur is present.    No friction rub. No gallop. No S3 or S4 sounds.  Pulmonary:     Effort: No tachypnea, accessory muscle usage or respiratory distress.     Breath sounds: No stridor. No decreased breath sounds, wheezing, rhonchi or rales.  Chest:     Chest wall: No tenderness.  Abdominal:     General: Bowel sounds are normal. There is distension.     Palpations: Abdomen is soft. Abdomen is not rigid.     Tenderness: There is no abdominal tenderness. There is no guarding or rebound.     Hernia: A hernia is present.     Comments: Ascitic fluid wave is palpated but is not tense and awaited he require paracentesis  Small right inguinal hernia easily reducible  Genitourinary:    Comments: Mild amount of fluid in the scrotum sac Musculoskeletal:         General: Normal range of motion.     Cervical back: Normal range of motion and neck supple. No edema, erythema or rigidity. No muscular tenderness. Normal range of motion.     Right lower leg: No edema.     Left lower leg: No edema.  Lymphadenopathy:     Head:     Right side of head: No submental or submandibular adenopathy.     Left side of head: No submental or submandibular adenopathy.     Cervical: No cervical adenopathy.  Skin:    General: Skin is warm and dry.     Coloration: Skin is not pale.     Findings: No rash.     Nails: There is no clubbing.  Neurological:     General: No focal deficit present.     Mental Status: He is alert and oriented to person, place, and time.     Sensory: No sensory deficit.  Psychiatric:        Mood and Affect: Mood normal.        Speech: Speech normal.        Behavior: Behavior normal.        Thought Content: Thought content normal.        Judgment: Judgment normal.     No results found for any visits on 06/29/21.  Last CBC Lab Results  Component Value Date   WBC 6.8 02/01/2021   HGB 14.0 02/01/2021   HCT 42.5 02/01/2021   MCV 94.1 02/01/2021   MCH 31.4 04/01/2020   RDW 15.2 02/01/2021   PLT 99.0 (L) 66/44/0347   Last metabolic panel Lab Results  Component Value Date   GLUCOSE 93 02/01/2021   NA 138 02/01/2021   K 4.5 02/01/2021   CL 105 02/01/2021   CO2 26 02/01/2021   BUN 18 02/01/2021   CREATININE 1.15 02/01/2021   EGFR 75 05/11/2020   CALCIUM 9.7 02/01/2021   PROT 7.7 02/01/2021   ALBUMIN 4.1 02/01/2021   LABGLOB  4.9 (H) 08/06/2019   AGRATIO 0.6 (L) 08/06/2019   BILITOT 1.0 02/01/2021   ALKPHOS 82 02/01/2021   AST 23 02/01/2021   ALT 25 02/01/2021   ANIONGAP 8 04/01/2020   Last lipids Lab Results  Component Value Date   CHOL 246 (H) 08/06/2019   HDL 27 (L) 08/06/2019   LDLCALC 187 (H) 08/06/2019   TRIG 169 (H) 08/06/2019   CHOLHDL 9.1 (H) 08/06/2019   Last hemoglobin A1c Lab Results  Component Value  Date   HGBA1C 5.5 12/16/2015   Last thyroid functions Lab Results  Component Value Date   TSH 2.820 08/06/2019   T4TOTAL 6.5 08/06/2019   Last vitamin D No results found for: 25OHVITD2, 25OHVITD3, VD25OH Last vitamin B12 and Folate No results found for: VITAMINB12, FOLATE    The 10-year ASCVD risk score (Arnett DK, et al., 2019) is: 36.2%    Assessment & Plan:   Problem List Items Addressed This Visit       Cardiovascular and Mediastinum   Hypertension - Primary   Relevant Medications   atorvastatin (LIPITOR) 20 MG tablet   nadolol (CORGARD) 80 MG tablet   spironolactone (ALDACTONE) 100 MG tablet   amLODipine (NORVASC) 10 MG tablet   furosemide (LASIX) 20 MG tablet   Other Relevant Orders   Comprehensive metabolic panel   CBC with Differential/Platelet   Aortic atherosclerosis (HCC)   Relevant Medications   atorvastatin (LIPITOR) 20 MG tablet   nadolol (CORGARD) 80 MG tablet   spironolactone (ALDACTONE) 100 MG tablet   amLODipine (NORVASC) 10 MG tablet   furosemide (LASIX) 20 MG tablet   Other Relevant Orders   Lipid panel   Portal hypertension (HCC)   Relevant Medications   atorvastatin (LIPITOR) 20 MG tablet   nadolol (CORGARD) 80 MG tablet   spironolactone (ALDACTONE) 100 MG tablet   amLODipine (NORVASC) 10 MG tablet   furosemide (LASIX) 20 MG tablet   Other Relevant Orders   Comprehensive metabolic panel     Digestive   Hepatic cirrhosis (HCC)   Relevant Medications   nadolol (CORGARD) 80 MG tablet   omeprazole (PRILOSEC) 40 MG capsule   Other Relevant Orders   Comprehensive metabolic panel     Hematopoietic and Hemostatic   Thrombocytopenia (HCC)   Other Visit Diagnoses     Gastritis, presence of bleeding unspecified, unspecified chronicity, unspecified gastritis type       Relevant Medications   omeprazole (PRILOSEC) 40 MG capsule   Other Relevant Orders   CBC with Differential/Platelet   Hyperglycemia       Relevant Orders   Hemoglobin  A1c     38 minutes spent extra time for patient education given Prevnar 20 vaccine given Return in about 2 months (around 08/29/2021) for [facility] 3 weeks, Dr Joya Gaskins 2 months.    Asencion Noble, MD

## 2021-06-29 NOTE — Assessment & Plan Note (Signed)
No signs of bleeding we will obtain labs

## 2021-06-29 NOTE — Addendum Note (Signed)
Addended by: Carilyn Goodpasture on: 06/29/2021 04:43 PM   Modules accepted: Orders

## 2021-06-29 NOTE — Assessment & Plan Note (Signed)
Stable at this time 

## 2021-06-29 NOTE — Assessment & Plan Note (Signed)
Unfortunately patient has begun drinking alcohol again  He is interested in stopping and has reached his rock bottom  The patient now is in agreement to going to [place] I gave him the phone number to call to go to [place] meetings he is not at risk for withdrawal based on his alcohol consumption right

## 2021-06-29 NOTE — Assessment & Plan Note (Signed)
Blood pressure out-of-control plan to begin amlodipine 10 mg daily and increase nadolol to 80 mg daily and continue Aldactone 100 mg daily  The following Lifestyle Medicine recommendations according to [place] of Lifestyle Medicine [facility]) were discussed and offered to patient who agrees to start the journey:  A. Whole Foods, Plant-based plate comprising of fruits and vegetables, plant-based proteins, whole-grain carbohydrates was discussed in detail with the patient.   A list for source of those nutrients were also provided to the patient.  Patient will use only water or unsweetened tea for hydration. B.  The need to stay away from risky substances including alcohol, smoking; obtaining 7 to 9 hours of restorative sleep, at least 150 minutes of moderate intensity exercise weekly, the importance of healthy social connections,  and stress reduction techniques were discussed.

## 2021-06-29 NOTE — Assessment & Plan Note (Signed)
Has completed course of therapy monitor

## 2021-06-29 NOTE — Patient Instructions (Addendum)
Increase nadolol to 80 mg daily  Stay on Aldactone daily 100 mg  Begin amlodipine 1 pill daily for blood pressure  Use furosemide 1 pill daily as needed for swelling in the scrotum and abdomen for fluid retention  Follow the lifestyle medicine handout we gave with regards to healthy diet options  Prevnar 20 vaccine was given  Return to see Lurena Joiner our clinical pharmacist 3 weeks for your blood pressure check return to Dr. Joya Gaskins in 2 months   Below is the alcohol Anonymous phone number to call for local meetings  We discussed the importance of you stopping the daily use of the alcohol   [place].org is the website for narcotics anonymous  [place] (website) or ([phone] is the information for alcoholics anonymous  Both are free and immediately available for help with alcohol and drug use

## 2021-06-29 NOTE — Progress Notes (Signed)
Concerns with straining on BM and pain in groin post hernia operation  Request labs  Discuss ascites

## 2021-06-29 NOTE — Assessment & Plan Note (Signed)
Monitor CBC 

## 2021-06-30 ENCOUNTER — Other Ambulatory Visit: Payer: Self-pay

## 2021-06-30 ENCOUNTER — Telehealth: Payer: Self-pay

## 2021-06-30 LAB — COMPREHENSIVE METABOLIC PANEL
ALT: 28 IU/L (ref 0–44)
AST: 36 IU/L (ref 0–40)
Albumin/Globulin Ratio: 1.4 (ref 1.2–2.2)
Albumin: 4.4 g/dL (ref 3.8–4.8)
Alkaline Phosphatase: 92 IU/L (ref 44–121)
BUN/Creatinine Ratio: 10 (ref 10–24)
BUN: 12 mg/dL (ref 8–27)
Bilirubin Total: 0.7 mg/dL (ref 0.0–1.2)
CO2: 23 mmol/L (ref 20–29)
Calcium: 9.8 mg/dL (ref 8.6–10.2)
Chloride: 104 mmol/L (ref 96–106)
Creatinine, Ser: 1.18 mg/dL (ref 0.76–1.27)
Globulin, Total: 3.2 g/dL (ref 1.5–4.5)
Glucose: 108 mg/dL — ABNORMAL HIGH (ref 70–99)
Potassium: 4.8 mmol/L (ref 3.5–5.2)
Sodium: 140 mmol/L (ref 134–144)
Total Protein: 7.6 g/dL (ref 6.0–8.5)
eGFR: 68 mL/min/{1.73_m2} (ref >59)

## 2021-06-30 LAB — LIPID PANEL
Chol/HDL Ratio: 1.8 ratio (ref 0.0–5.0)
Cholesterol, Total: 160 mg/dL (ref 100–199)
HDL: 87 mg/dL (ref >39)
LDL Chol Calc (NIH): 57 mg/dL (ref 0–99)
Triglycerides: 89 mg/dL (ref 0–149)
VLDL Cholesterol Cal: 16 mg/dL (ref 5–40)

## 2021-06-30 LAB — HEMOGLOBIN A1C
Est. average glucose Bld gHb Est-mCnc: 111 mg/dL
Hgb A1c MFr Bld: 5.5 % (ref 4.8–5.6)

## 2021-06-30 LAB — CBC WITH DIFFERENTIAL/PLATELET
Basophils Absolute: 0 10*3/uL (ref 0.0–0.2)
Basos: 0 %
EOS (ABSOLUTE): 0.1 10*3/uL (ref 0.0–0.4)
Eos: 1 %
Hematocrit: 40.8 % (ref 37.5–51.0)
Hemoglobin: 13.8 g/dL (ref 13.0–17.7)
Immature Grans (Abs): 0 10*3/uL (ref 0.0–0.1)
Immature Granulocytes: 0 %
Lymphocytes Absolute: 1.1 10*3/uL (ref 0.7–3.1)
Lymphs: 21 %
MCH: 31.1 pg (ref 26.6–33.0)
MCHC: 33.8 g/dL (ref 31.5–35.7)
MCV: 92 fL (ref 79–97)
Monocytes Absolute: 0.5 10*3/uL (ref 0.1–0.9)
Monocytes: 10 %
Neutrophils Absolute: 3.6 10*3/uL (ref 1.4–7.0)
Neutrophils: 68 %
Platelets: 121 10*3/uL — ABNORMAL LOW (ref 150–450)
RBC: 4.44 x10E6/uL (ref 4.14–5.80)
RDW: 13.7 % (ref 11.6–15.4)
WBC: 5.3 10*3/uL (ref 3.4–10.8)

## 2021-06-30 NOTE — Telephone Encounter (Signed)
Pt was called and vm was left, Information has been sent to nurse pool.   

## 2021-06-30 NOTE — Telephone Encounter (Signed)
-----   Message from Elsie Stain, MD sent at 06/30/2021  5:54 AM EDT ----- Let pt know all labs are normal

## 2021-07-04 ENCOUNTER — Other Ambulatory Visit: Payer: Self-pay

## 2021-07-19 ENCOUNTER — Other Ambulatory Visit: Payer: Self-pay | Admitting: Pharmacist

## 2021-07-19 NOTE — Chronic Care Management (AMB) (Signed)
Patient  outreached by Tanja Port, PharmD Candidate on 07/19/21 to discuss hypertension.   Patient has an automated home blood pressure machine, but have not checked recently  Medication review was performed. They are not taking medications as prescribed. Reports he is taking amlodipine 10 mg daily instead of 5 mg. Discussed getting pill cutter. Patient also reports taking furosemide daily instead of PRN  The following barriers to adherence were noted: - Denies concerns with medication access or understanding.  The following interventions were completed:  - Medications were reviewed - Patient was educated on medications, including indication and administration - Patient was educated on proper technique to check home blood pressure and reminded to bring home machine and readings to next provider appointment  The patient has follow up scheduled:  PCP: not scheduled - was instructed to schedule follow up in July but has not.   Patient agreed to check BP at home. Will follow up on Friday.   Catie Hedwig Morton, PharmD, [facility] [phone]

## 2021-07-21 NOTE — Chronic Care Management (AMB) (Signed)
Patient attempted to be outreached by Camila Li Day, PharmD Candidate on 07/21/21 to discuss hypertension.   He reports his BP yesterday was 131/73. Patient believes his BP was high in clinic due to heavy alcohol use the night before his appt.   Medication review was performed. They are taking medications as prescribed.   The following barriers to adherence were noted: - Denies concerns with medication access or understanding.  The following interventions were completed:  - Medications were reviewed - Patient was educated on proper technique to check home blood pressure and reminded to bring home machine and readings to next provider appointment Encouraged to call [facility] to set up an appointment with Dr. Joya Gaskins.     Catie Hedwig Morton, PharmD, [facility] [phone]

## 2021-07-31 ENCOUNTER — Other Ambulatory Visit: Payer: Self-pay

## 2021-07-31 ENCOUNTER — Other Ambulatory Visit (HOSPITAL_COMMUNITY): Payer: Self-pay

## 2021-08-24 ENCOUNTER — Other Ambulatory Visit: Payer: Self-pay | Admitting: Critical Care Medicine

## 2021-08-24 ENCOUNTER — Other Ambulatory Visit: Payer: Self-pay

## 2021-08-24 DIAGNOSIS — K2970 Gastritis, unspecified, without bleeding: Secondary | ICD-10-CM

## 2021-08-24 DIAGNOSIS — K7030 Alcoholic cirrhosis of liver without ascites: Secondary | ICD-10-CM

## 2021-08-24 MED ORDER — OMEPRAZOLE 40 MG PO CPDR
40.0000 mg | DELAYED_RELEASE_CAPSULE | Freq: Every day | ORAL | 0 refills | Status: DC
  Filled 2021-08-24: qty 30, 30d supply, fill #0

## 2021-08-25 ENCOUNTER — Other Ambulatory Visit: Payer: Self-pay

## 2021-08-28 NOTE — Progress Notes (Incomplete)
Established Patient Office Visit  Subjective   Patient ID: Richard Yang, male    DOB: Mar 11, 1955  Age: 66 y.o. MRN: [id number]  No chief complaint on file.   06/6060 This a 66 year old pleasant male who is seen in return follow-up not seen since September 2022.  Patient unfortunately is gone back to drinking alcohol he drinks about 4 [name] mixed with sugar Coke or Sprite daily.  He states he stays in his apartment which is in the downtown [place].  He states because of stress around him and individuals in the [place] who have other unhealthy behaviors he likes to stay inside in his apartment all day.  He becomes bored and at times will then lead to drinking alcohol.  He does not have any good social connections.  He has some family in the area but he is originally from [place] area.  Another issue is that he has groin pain and testicular pain with more swelling.  Another issue is on arrival blood pressure was 180/100.  The patient's only on low-dose enalapril and Aldactone for his cirrhotic liver disease.  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  He has portal hypertension with gastropathy.  He has had no bleeding recently.  Patient does not smoke cigarettes or use any other substances  Patient does have aortic atherosclerosis and is on atorvastatin which will have to be careful with given his liver disease  Patient is due a Prevnar 20 vaccine he agrees to receive this  7/25   Cardiovascular and Mediastinum  Hypertension - Primary  Relevant Medications  atorvastatin (LIPITOR) 20 MG tablet  nadolol (CORGARD) 80 MG tablet  spironolactone (ALDACTONE) 100 MG tablet  amLODipine (NORVASC) 10 MG tablet  furosemide (LASIX) 20 MG tablet  Other Relevant Orders  Comprehensive metabolic panel  CBC with Differential/Platelet  Aortic atherosclerosis (HCC)  Relevant Medications  atorvastatin (LIPITOR) 20 MG tablet  nadolol (CORGARD) 80  MG tablet  spironolactone (ALDACTONE) 100 MG tablet  amLODipine (NORVASC) 10 MG tablet  furosemide (LASIX) 20 MG tablet  Other Relevant Orders  Lipid panel  Portal hypertension (HCC)  Relevant Medications  atorvastatin (LIPITOR) 20 MG tablet  nadolol (CORGARD) 80 MG tablet  spironolactone (ALDACTONE) 100 MG tablet  amLODipine (NORVASC) 10 MG tablet  furosemide (LASIX) 20 MG tablet  Other Relevant Orders  Comprehensive metabolic panel   Digestive  Hepatic cirrhosis (HCC)  Relevant Medications  nadolol (CORGARD) 80 MG tablet  omeprazole (PRILOSEC) 40 MG capsule  Other Relevant Orders  Comprehensive metabolic panel   Hematopoietic and Hemostatic  Thrombocytopenia (HCC)  Other Visit Diagnoses    Gastritis, presence of bleeding unspecified, unspecified chronicity, unspecified gastritis type      Relevant Medications  omeprazole (PRILOSEC) 40 MG capsule  Other Relevant Orders  CBC with Differential/Platelet  Hyperglycemia      Relevant Orders  Hemoglobin A1c     Patient Active Problem List   Diagnosis Date Noted  . Hx of esophageal varices 10/03/2020  . Hepatic cirrhosis ([place]) 10/03/2020  . Portal hypertensive gastropathy ([place]) 10/03/2020  . Other cirrhosis of liver ([place]) 11/16/2019  . Ascites of liver 11/16/2019  . Chronic hepatitis C without hepatic coma ([place]) 09/23/2019  . Portal hypertension ([place]) 08/06/2019  . Thrombocytopenia ([place]) 08/06/2019  . Inguinal hernia 07/13/2019  . Aortic atherosclerosis ([place]) 07/13/2019  . Alcohol abuse 07/13/2019  . Liver cirrhosis, alcoholic ([place]) 87/56/4332  . Hypertension 06/24/2019   Past Medical History:  Diagnosis Date  . Alcohol use disorder, moderate, in early remission, dependence ([place]) 11/16/2019  . Ascites   . Cirrhosis ([place])   . Elevated AFP   . Hepatitis C   . Incarcerated umbilical hernia 03/07/8655  . Inguinal hernia   . Portal hypertension ([place])   . Substance abuse ([place])    alcohol abuse hx  . Umbilical  hernia    Past Surgical History:  Procedure Laterality Date  . INGUINAL HERNIA REPAIR N/A 03/29/2020   Procedure: OPEN UMBILICAL HERNIA REPAIR, BOWEL RESECTION;  Surgeon: Felicie Morn, MD;  Location: [facility];  Service: General;  Laterality: N/A;  . IR PARACENTESIS  09/16/2019  . IR PARACENTESIS  10/09/2019  . IR PARACENTESIS  12/09/2019   Social History   Tobacco Use  . Smoking status: Never  . Smokeless tobacco: Never  Vaping Use  . Vaping Use: Never used  Substance Use Topics  . Alcohol use: Yes  . Drug use: Not Currently   Social History   Socioeconomic History  . Marital status: Single    Spouse name: Not on file  . Number of children: Not on file  . Years of education: Not on file  . Highest education level: Not on file  Occupational History  . Not on file  Tobacco Use  . Smoking status: Never  . Smokeless tobacco: Never  Vaping Use  . Vaping Use: Never used  Substance and Sexual Activity  . Alcohol use: Yes  . Drug use: Not Currently  . Sexual activity: Not Currently    Partners: Male  Other Topics Concern  . Not on file  Social History Narrative  . Not on file   Social Determinants of Health   Financial Resource Strain: Not on file  Food Insecurity: Not on file  Transportation Needs: Not on file  Physical Activity: Not on file  Stress: Not on file  Social Connections: Not on file  Intimate Partner Violence: Not on file   Family Status  Relation Name Status  . Neg Hx  (Not Specified)   Family History  Problem Relation Age of Onset  . Stomach cancer Neg Hx   . Colon cancer Neg Hx   . Esophageal cancer Neg Hx   . Pancreatic cancer Neg Hx   . Colon polyps Neg Hx   . Rectal cancer Neg Hx    No Known Allergies    Review of Systems  Constitutional:  Negative for chills, diaphoresis, fever, malaise/fatigue and weight loss.  HENT:  Negative for congestion, hearing loss, nosebleeds, sore throat and tinnitus.   Eyes:  Negative for blurred  vision, photophobia and redness.  Respiratory:  Negative for cough, hemoptysis, sputum production, shortness of breath, wheezing and stridor.   Cardiovascular:  Negative for chest pain, palpitations, orthopnea, claudication, leg swelling and PND.  Gastrointestinal:  Negative for abdominal pain, blood in stool, constipation, diarrhea, heartburn, nausea and vomiting.  Genitourinary:  Negative for dysuria, flank pain, frequency, hematuria and urgency.  Musculoskeletal:  Negative for back pain, falls, joint pain, myalgias and neck pain.  Skin:  Negative for itching and rash.  Neurological:  Negative for dizziness, tingling, tremors, sensory change, speech change, focal weakness, seizures, loss of consciousness, weakness and headaches.  Endo/Heme/Allergies:  Negative for environmental allergies and polydipsia. Does not bruise/bleed easily.  Psychiatric/Behavioral:  Negative for depression, memory loss, substance abuse and suicidal ideas. The patient is not nervous/anxious and does not have insomnia.       Objective:  There were no vitals taken for this visit. BP Readings from Last 3 Encounters:  07/20/21 131/73  06/29/21 (!) 180/92  02/01/21 130/78   Wt Readings from Last 3 Encounters:  06/29/21 219 lb (99.3 kg)  02/01/21 215 lb (97.5 kg)  10/19/20 205 lb 12.8 oz (93.4 kg)      Physical Exam Vitals reviewed.  Constitutional:      Appearance: Normal appearance. He is well-developed. He is obese. He is not diaphoretic.  HENT:     Head: Normocephalic and atraumatic.     Nose: Nose normal. No nasal deformity, septal deviation, mucosal edema or rhinorrhea.     Right Sinus: No maxillary sinus tenderness or frontal sinus tenderness.     Left Sinus: No maxillary sinus tenderness or frontal sinus tenderness.     Mouth/Throat:     Mouth: Mucous membranes are moist.     Pharynx: Oropharynx is clear. No oropharyngeal exudate.  Eyes:     General: No scleral icterus.    Conjunctiva/sclera:  Conjunctivae normal.     Pupils: Pupils are equal, round, and reactive to light.  Neck:     Thyroid: No thyromegaly.     Vascular: No carotid bruit or JVD.     Trachea: Trachea normal. No tracheal tenderness or tracheal deviation.  Cardiovascular:     Rate and Rhythm: Normal rate and regular rhythm.     Chest Wall: PMI is not displaced.     Pulses: Normal pulses. No decreased pulses.     Heart sounds: Normal heart sounds, S1 normal and S2 normal. Heart sounds not distant. No murmur heard.    No systolic murmur is present.     No diastolic murmur is present.     No friction rub. No gallop. No S3 or S4 sounds.  Pulmonary:     Effort: No tachypnea, accessory muscle usage or respiratory distress.     Breath sounds: No stridor. No decreased breath sounds, wheezing, rhonchi or rales.  Chest:     Chest wall: No tenderness.  Abdominal:     General: Bowel sounds are normal. There is distension.     Palpations: Abdomen is soft. Abdomen is not rigid.     Tenderness: There is no abdominal tenderness. There is no guarding or rebound.     Hernia: A hernia is present.     Comments: Ascitic fluid wave is palpated but is not tense and awaited he require paracentesis  Small right inguinal hernia easily reducible  Genitourinary:    Comments: Mild amount of fluid in the scrotum sac Musculoskeletal:        General: Normal range of motion.     Cervical back: Normal range of motion and neck supple. No edema, erythema or rigidity. No muscular tenderness. Normal range of motion.     Right lower leg: No edema.     Left lower leg: No edema.  Lymphadenopathy:     Head:     Right side of head: No submental or submandibular adenopathy.     Left side of head: No submental or submandibular adenopathy.     Cervical: No cervical adenopathy.  Skin:    General: Skin is warm and dry.     Coloration: Skin is not pale.     Findings: No rash.     Nails: There is no clubbing.  Neurological:     General: No focal  deficit present.     Mental Status: He is alert and oriented to person, place, and  time.     Sensory: No sensory deficit.  Psychiatric:        Mood and Affect: Mood normal.        Speech: Speech normal.        Behavior: Behavior normal.        Thought Content: Thought content normal.        Judgment: Judgment normal.     No results found for any visits on 08/29/21.  Last CBC Lab Results  Component Value Date   WBC 5.3 06/29/2021   HGB 13.8 06/29/2021   HCT 40.8 06/29/2021   MCV 92 06/29/2021   MCH 31.1 06/29/2021   RDW 13.7 06/29/2021   PLT 121 (L) 25/36/6440   Last metabolic panel Lab Results  Component Value Date   GLUCOSE 108 (H) 06/29/2021   NA 140 06/29/2021   K 4.8 06/29/2021   CL 104 06/29/2021   CO2 23 06/29/2021   BUN 12 06/29/2021   CREATININE 1.18 06/29/2021   EGFR 68 06/29/2021   CALCIUM 9.8 06/29/2021   PROT 7.6 06/29/2021   ALBUMIN 4.4 06/29/2021   LABGLOB 3.2 06/29/2021   AGRATIO 1.4 06/29/2021   BILITOT 0.7 06/29/2021   ALKPHOS 92 06/29/2021   AST 36 06/29/2021   ALT 28 06/29/2021   ANIONGAP 8 04/01/2020   Last lipids Lab Results  Component Value Date   CHOL 160 06/29/2021   HDL 87 06/29/2021   LDLCALC 57 06/29/2021   TRIG 89 06/29/2021   CHOLHDL 1.8 06/29/2021   Last hemoglobin A1c Lab Results  Component Value Date   HGBA1C 5.5 06/29/2021   Last thyroid functions Lab Results  Component Value Date   TSH 2.820 08/06/2019   T4TOTAL 6.5 08/06/2019   Last vitamin D No results found for: "25OHVITD2", "25OHVITD3", "VD25OH" Last vitamin B12 and Folate No results found for: "VITAMINB12", "FOLATE"    The 10-year ASCVD risk score (Arnett DK, et al., 2019) is: 14.4%    Assessment & Plan:   Problem List Items Addressed This Visit   None 38 minutes spent extra time for patient education given Prevnar 20 vaccine given No follow-ups on file.    Asencion Noble, MD

## 2021-08-29 ENCOUNTER — Ambulatory Visit: Payer: Medicare Other | Attending: Critical Care Medicine | Admitting: Critical Care Medicine

## 2021-08-29 ENCOUNTER — Encounter: Payer: Self-pay | Admitting: Critical Care Medicine

## 2021-08-29 ENCOUNTER — Other Ambulatory Visit: Payer: Self-pay

## 2021-08-29 VITALS — BP 129/79 | HR 62 | Ht 70.0 in | Wt 221.6 lb

## 2021-08-29 DIAGNOSIS — K2970 Gastritis, unspecified, without bleeding: Secondary | ICD-10-CM

## 2021-08-29 DIAGNOSIS — D696 Thrombocytopenia, unspecified: Secondary | ICD-10-CM

## 2021-08-29 DIAGNOSIS — B182 Chronic viral hepatitis C: Secondary | ICD-10-CM

## 2021-08-29 DIAGNOSIS — K7030 Alcoholic cirrhosis of liver without ascites: Secondary | ICD-10-CM | POA: Diagnosis not present

## 2021-08-29 DIAGNOSIS — R188 Other ascites: Secondary | ICD-10-CM

## 2021-08-29 DIAGNOSIS — I10 Essential (primary) hypertension: Secondary | ICD-10-CM | POA: Diagnosis not present

## 2021-08-29 DIAGNOSIS — Z23 Encounter for immunization: Secondary | ICD-10-CM | POA: Diagnosis not present

## 2021-08-29 DIAGNOSIS — K3189 Other diseases of stomach and duodenum: Secondary | ICD-10-CM

## 2021-08-29 DIAGNOSIS — Z139 Encounter for screening, unspecified: Secondary | ICD-10-CM

## 2021-08-29 DIAGNOSIS — N5089 Other specified disorders of the male genital organs: Secondary | ICD-10-CM

## 2021-08-29 DIAGNOSIS — K7031 Alcoholic cirrhosis of liver with ascites: Secondary | ICD-10-CM

## 2021-08-29 DIAGNOSIS — K766 Portal hypertension: Secondary | ICD-10-CM

## 2021-08-29 DIAGNOSIS — I700 Atherosclerosis of aorta: Secondary | ICD-10-CM

## 2021-08-29 DIAGNOSIS — J302 Other seasonal allergic rhinitis: Secondary | ICD-10-CM

## 2021-08-29 MED ORDER — AMLODIPINE BESYLATE 5 MG PO TABS
5.0000 mg | ORAL_TABLET | Freq: Every day | ORAL | 2 refills | Status: DC
  Filled 2021-08-29: qty 90, 90d supply, fill #0
  Filled 2021-11-27: qty 90, 90d supply, fill #1

## 2021-08-29 MED ORDER — SPIRONOLACTONE 100 MG PO TABS
ORAL_TABLET | Freq: Every day | ORAL | 2 refills | Status: DC
  Filled 2021-08-29: qty 90, fill #0
  Filled 2021-11-27: qty 90, 90d supply, fill #0

## 2021-08-29 MED ORDER — NADOLOL 80 MG PO TABS
80.0000 mg | ORAL_TABLET | Freq: Every day | ORAL | 3 refills | Status: DC
  Filled 2021-11-09: qty 30, 30d supply, fill #0
  Filled 2021-12-22: qty 10, 10d supply, fill #1
  Filled 2021-12-25: qty 20, 20d supply, fill #1

## 2021-08-29 MED ORDER — OMEPRAZOLE 40 MG PO CPDR
40.0000 mg | DELAYED_RELEASE_CAPSULE | Freq: Every day | ORAL | 2 refills | Status: DC
  Filled 2021-08-29: qty 60, 60d supply, fill #0
  Filled 2021-11-09: qty 30, 30d supply, fill #0
  Filled 2021-12-15: qty 30, 30d supply, fill #1

## 2021-08-29 NOTE — Assessment & Plan Note (Signed)
Per patient, he is using over the counter medication. He is ok to continue He will let us know the name at follow up visit

## 2021-08-29 NOTE — Patient Instructions (Signed)
Your amlodipine will be changed to a single 5 mg tablet take daily all other medications the same do you stay on the pantoprazole daily 40 mg in the morning for your stomach acid  Metabolic panel from the lab will be obtained  We will give you your first of your 2 shingles vaccinations you return for your second vaccine in 6 to 8 weeks for nurse visit  Return to Dr. Joya Gaskins 4 months  Keep up the good work with your healthy eating and alcohol reduction

## 2021-08-29 NOTE — Progress Notes (Signed)
Established Patient Office Visit  Subjective   Patient ID: Ural Acree, male    DOB: [birth year]  Age: 66 y.o. MRN: [id number]  Chief Complaint  Patient presents with   Hypertension   Medication Refill    Mr Steward is a 66 year old male presenting today for follow up. 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  His blood pressure today is 131/79. He does take his pressures at home and reports they usually run about 129/78. He is using amlodipine, nadolol and spironolactone daily. Denies any symptoms of easy bruising or bleeding.   At the last visit in May 2023, Mr Casher reported a recent increase in his alcohol use [name] liquor), however now he reports about 2 glasses a wine a week. He has been actively trying to live a better lifestyle and even took a cooking class on healthy eating. Reports he has gained some weight over the last few months, but believes it is directly correlated to his recent diet. Denies sudden weight gain or abnormal swelling.   In May, he also had some groin/ testicular pain and swelling that has now improved greatly. He is using Lasix as needed with swelling.   He does have allergy symptoms often and uses a pill from the store which he does not know the name of. This does seem to cover his symptoms. He will obtain the name and tell us at the next visit.     Patient Active Problem List   Diagnosis Date Noted   Testicular swelling 08/29/2021   Seasonal allergic rhinitis 08/29/2021   Encounter for health-related screening 08/29/2021   Hx of esophageal varices 10/03/2020   Hepatic cirrhosis ([place]) 10/03/2020   Portal hypertensive gastropathy ([place]) 10/03/2020   Other cirrhosis of liver ([place]) 11/16/2019   Ascites of liver 11/16/2019   Chronic hepatitis C without hepatic coma ([place]) 09/23/2019   Portal hypertension ([place]) 08/06/2019   Thrombocytopenia ([place]) 08/06/2019   Inguinal hernia  07/13/2019   Aortic atherosclerosis ([place]) 07/13/2019   Alcohol abuse 07/13/2019   Liver cirrhosis, alcoholic ([place]) 34/35/6861   Hypertension 06/24/2019   Past Medical History:  Diagnosis Date   Alcohol use disorder, moderate, in early remission, dependence ([place]) 11/16/2019   Ascites    Cirrhosis (HCC)    Elevated AFP    Hepatitis C    Incarcerated umbilical hernia 6/83/7290   Inguinal hernia    Portal hypertension ([place])    Substance abuse ([place])    alcohol abuse hx   Umbilical hernia    Past Surgical History:  Procedure Laterality Date   INGUINAL HERNIA REPAIR N/A 03/29/2020   Procedure: OPEN UMBILICAL HERNIA REPAIR, BOWEL RESECTION;  Surgeon: Felicie Morn, MD;  Location: [facility];  Service: General;  Laterality: N/A;   IR PARACENTESIS  09/16/2019   IR PARACENTESIS  10/09/2019   IR PARACENTESIS  12/09/2019   Social History   Tobacco Use   Smoking status: Never   Smokeless tobacco: Never  Vaping Use   Vaping Use: Never used  Substance Use Topics   Alcohol use: Yes    Alcohol/week: 2.0 standard drinks of alcohol    Types: 2 Glasses of wine per week   Drug use: Not Currently   Family History  Problem Relation Age of Onset   Stomach cancer Neg Hx    Colon cancer Neg Hx    Esophageal cancer Neg Hx    Pancreatic cancer  Neg Hx    Colon polyps Neg Hx    Rectal cancer Neg Hx    No Known Allergies    Review of Systems  Constitutional:  Negative for chills, diaphoresis, fever, malaise/fatigue and weight loss.       Weight gain  HENT: Negative.  Negative for congestion, hearing loss, nosebleeds, sore throat and tinnitus.   Eyes: Negative.  Negative for blurred vision, photophobia and redness.  Respiratory: Negative.  Negative for cough, hemoptysis, sputum production, shortness of breath, wheezing and stridor.   Cardiovascular:  Positive for leg swelling. Negative for chest pain, palpitations, orthopnea, claudication and PND.  Gastrointestinal: Negative.  Negative for  abdominal pain, blood in stool, constipation, diarrhea, heartburn, nausea and vomiting.  Genitourinary: Negative.  Negative for dysuria, flank pain, frequency, hematuria and urgency.  Musculoskeletal: Negative.  Negative for back pain, falls, joint pain, myalgias and neck pain.  Skin: Negative.  Negative for itching and rash.  Neurological: Negative.  Negative for dizziness, tingling, tremors, sensory change, speech change, focal weakness, seizures, loss of consciousness, weakness and headaches.  Endo/Heme/Allergies: Negative.  Negative for environmental allergies and polydipsia. Does not bruise/bleed easily.  Psychiatric/Behavioral: Negative.  Negative for depression, memory loss, substance abuse and suicidal ideas. The patient is not nervous/anxious and does not have insomnia.       Objective:     BP 129/79   Pulse 62   Ht 5' 10"  (1.778 m)   Wt 221 lb 9.6 oz (100.5 kg)   SpO2 99%   BMI 31.80 kg/m     Physical Exam Constitutional:      Appearance: Normal appearance.  HENT:     Right Ear: Tympanic membrane, ear canal and external ear normal.     Left Ear: Tympanic membrane, ear canal and external ear normal.     Nose: Nose normal.     Mouth/Throat:     Mouth: Mucous membranes are moist.     Pharynx: Oropharynx is clear.  Eyes:     Conjunctiva/sclera: Conjunctivae normal.     Pupils: Pupils are equal, round, and reactive to light.  Cardiovascular:     Rate and Rhythm: Normal rate and regular rhythm.  Pulmonary:     Effort: Pulmonary effort is normal.     Breath sounds: Normal breath sounds.  Abdominal:     General: Bowel sounds are normal.     Palpations: There is fluid wave.     Comments: Ascitic fluid wave is palpated but is not tense    Small right inguinal hernia easily reducible   Genitourinary:    Penis: Normal.      Testes: Normal.     Comments: Mild amount of fluid in scrotum sac much improved from previous Musculoskeletal:        General: Normal range of  motion.     Right lower leg: Edema present.     Left lower leg: Edema present.     Comments: Trace edema LE  Skin:    General: Skin is warm.  Neurological:     General: No focal deficit present.     Mental Status: He is alert and oriented to person, place, and time.  Psychiatric:        Mood and Affect: Mood normal.        Behavior: Behavior normal.      No results found for any visits on 08/29/21.     The 10-year ASCVD risk score (Arnett DK, et al., 2019) is: 14%  Assessment & Plan:   Problem List Items Addressed This Visit       Cardiovascular and Mediastinum   Hypertension - Primary    Well controlled at this time. BP today 131/79. Continue home BP measurements  Patient is cutting his 10 mg amlodipine in half for daily dosage,  will send 5 mg tablets to pharmacy  Continue daily amlodipine, nadalol and spironolactone   The following Lifestyle Medicine recommendations according to [facility]) were discussed and offered to patient who agrees to start the journey:  A. Whole Foods, Plant-based plate comprising of fruits and vegetables, plant-based proteins, whole-grain carbohydrates was discussed in detail with the patient.   A list for source of those nutrients were also provided to the patient.  Patient will use only water or unsweetened tea for hydration. B.  The need to stay away from risky substances including alcohol, smoking; obtaining 7 to 9 hours of restorative sleep, at least 150 minutes of moderate intensity exercise weekly, the importance of healthy social connections,  and stress reduction techniques were discussed.        Relevant Medications   amLODipine (NORVASC) 5 MG tablet   nadolol (CORGARD) 80 MG tablet   spironolactone (ALDACTONE) 100 MG tablet   Other Relevant Orders   BMP8+eGFR   Aortic atherosclerosis (HCC)    Continue atorvastatin, amlodipine, nadalol and spironolactone       Relevant Medications    amLODipine (NORVASC) 5 MG tablet   nadolol (CORGARD) 80 MG tablet   spironolactone (ALDACTONE) 100 MG tablet     Respiratory   Seasonal allergic rhinitis    Per patient, he is using over the counter medication. He is ok to continue He will let us know the name at follow up visit         Digestive   Liver cirrhosis, alcoholic ([place])    Has reduced ETOH to minimal amounts      Relevant Medications   nadolol (CORGARD) 80 MG tablet   omeprazole (PRILOSEC) 40 MG capsule   Chronic hepatitis C without hepatic coma ([place])    Has completed course of Treatment      Ascites of liver    Cont diuretics      Hepatic cirrhosis (HCC)   Relevant Medications   nadolol (CORGARD) 80 MG tablet   omeprazole (PRILOSEC) 40 MG capsule   Portal hypertensive gastropathy (HCC)    Cont nadolol, no signs of bleeding        Hematopoietic and Hemostatic   Thrombocytopenia (HCC)    Monitor CBC        Other   Testicular swelling    Much improved on physical exam compared to May visit Continue Lasix as needed for swelling       Encounter for health-related screening    Shingles vaccine done today       Other Visit Diagnoses     Gastritis, presence of bleeding unspecified, unspecified chronicity, unspecified gastritis type       Relevant Medications   omeprazole (PRILOSEC) 40 MG capsule     38 minutes spent on history and physical patient education complex decision making high  Return in about 4 months (around 12/30/2021) for followup, htn.    Asencion Noble, MD

## 2021-08-29 NOTE — Assessment & Plan Note (Signed)
Continue atorvastatin, amlodipine, nadalol and spironolactone

## 2021-08-29 NOTE — Assessment & Plan Note (Addendum)
Well controlled at this time. BP today 131/79. Continue home BP measurements  Patient is cutting his 10 mg amlodipine in half for daily dosage,  will send 5 mg tablets to pharmacy  Continue daily amlodipine, nadalol and spironolactone   The following Lifestyle Medicine recommendations according to [facility]) were discussed and offered to patient who agrees to start the journey:  A. Whole Foods, Plant-based plate comprising of fruits and vegetables, plant-based proteins, whole-grain carbohydrates was discussed in detail with the patient.   A list for source of those nutrients were also provided to the patient.  Patient will use only water or unsweetened tea for hydration. B.  The need to stay away from risky substances including alcohol, smoking; obtaining 7 to 9 hours of restorative sleep, at least 150 minutes of moderate intensity exercise weekly, the importance of healthy social connections,  and stress reduction techniques were discussed.

## 2021-08-29 NOTE — Assessment & Plan Note (Addendum)
Much improved on physical exam compared to May visit Continue Lasix as needed for swelling

## 2021-08-29 NOTE — Assessment & Plan Note (Signed)
Cont nadolol, no signs of bleeding

## 2021-08-29 NOTE — Assessment & Plan Note (Signed)
Shingles vaccine done today

## 2021-08-29 NOTE — Assessment & Plan Note (Signed)
Has completed course of Treatment

## 2021-08-29 NOTE — Assessment & Plan Note (Signed)
Has reduced ETOH to minimal amounts

## 2021-08-29 NOTE — Assessment & Plan Note (Signed)
Cont diuretics

## 2021-08-29 NOTE — Assessment & Plan Note (Signed)
Monitor CBC 

## 2021-08-30 ENCOUNTER — Telehealth: Payer: Self-pay

## 2021-08-30 LAB — BMP8+EGFR
BUN/Creatinine Ratio: 15 (ref 10–24)
BUN: 18 mg/dL (ref 8–27)
CO2: 20 mmol/L (ref 20–29)
Calcium: 9.7 mg/dL (ref 8.6–10.2)
Chloride: 104 mmol/L (ref 96–106)
Creatinine, Ser: 1.23 mg/dL (ref 0.76–1.27)
Glucose: 99 mg/dL (ref 70–99)
Potassium: 4.6 mmol/L (ref 3.5–5.2)
Sodium: 137 mmol/L (ref 134–144)
eGFR: 64 mL/min/{1.73_m2} (ref >59)

## 2021-08-30 NOTE — Telephone Encounter (Signed)
Pt was called and vm was left, Information has been sent to nurse pool.   

## 2021-08-30 NOTE — Progress Notes (Signed)
Let mr Lovejoy know lab was normal

## 2021-08-30 NOTE — Telephone Encounter (Signed)
-----   Message from Elsie Stain, MD sent at 08/30/2021 10:00 AM EDT ----- Let mr Martel know lab was normal

## 2021-09-10 IMAGING — US US ABDOMEN COMPLETE
1 series · 15 of 25 positions shown · non-contrast
Comparison: CT 03/29/2020

CLINICAL DATA: Cirrhosis

EXAM:
ABDOMEN ULTRASOUND COMPLETE

[Series 1: us abdomen complete mc & wl · 15 of 95 slices shown]
[im 1/95]
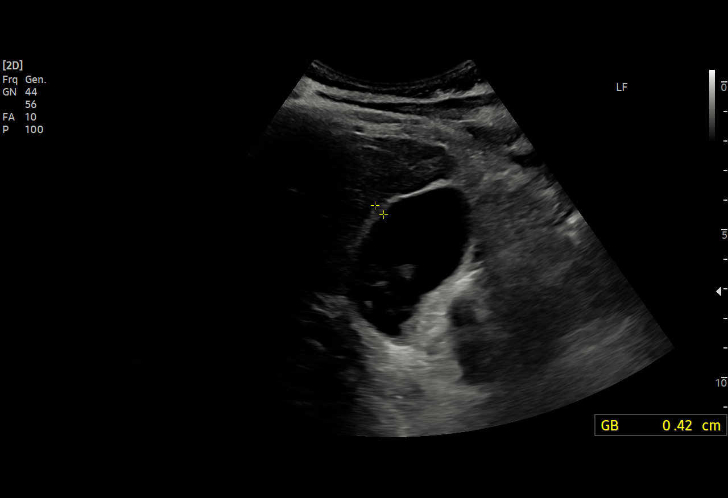
[im 8/95]
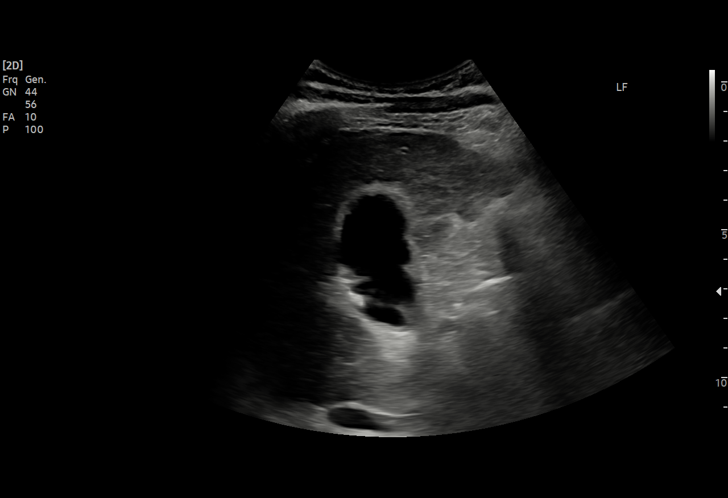
[im 16/95]
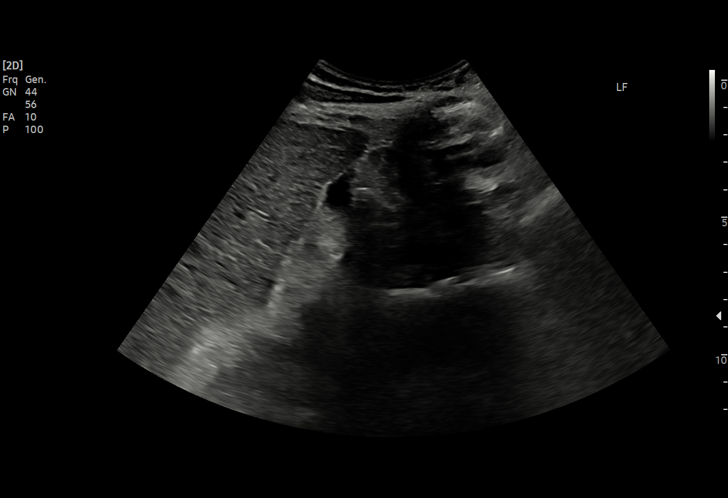
[im 20/95]
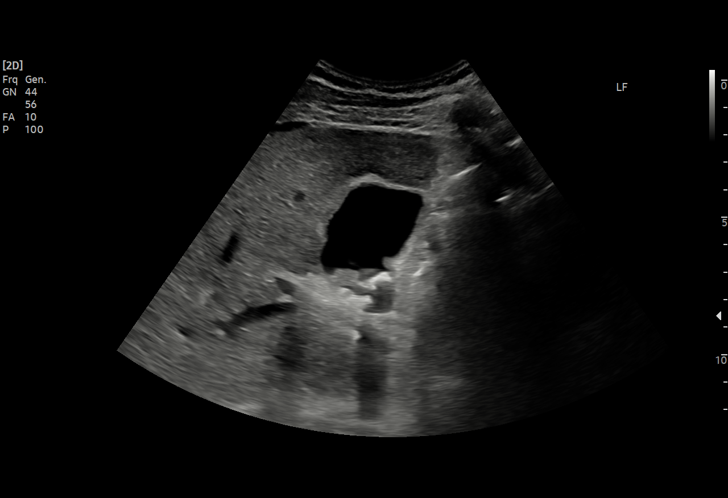
[im 28/95]
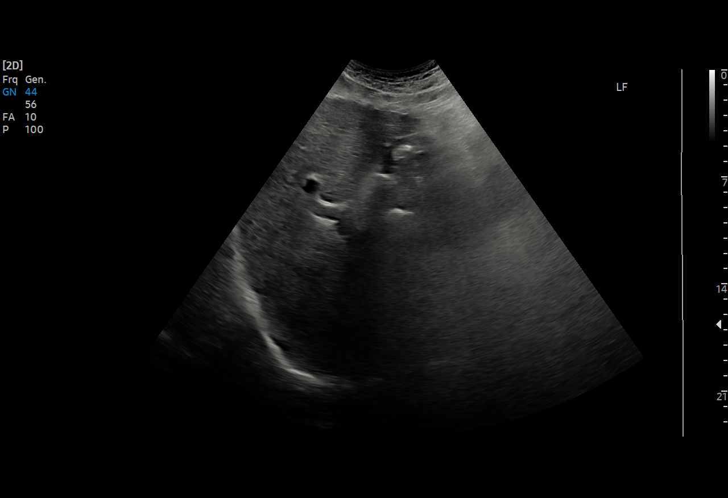
[im 36/95]
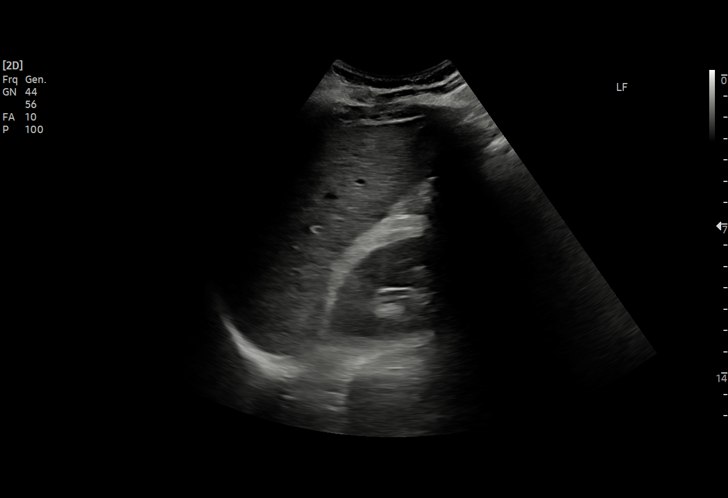
[im 40/95]
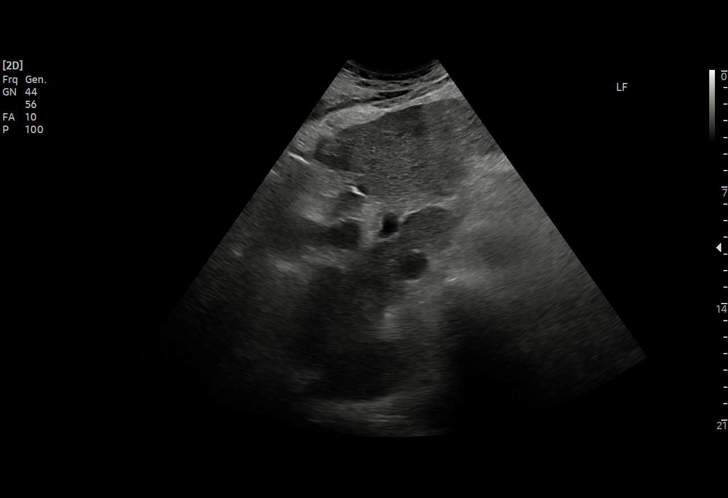
[im 48/95]
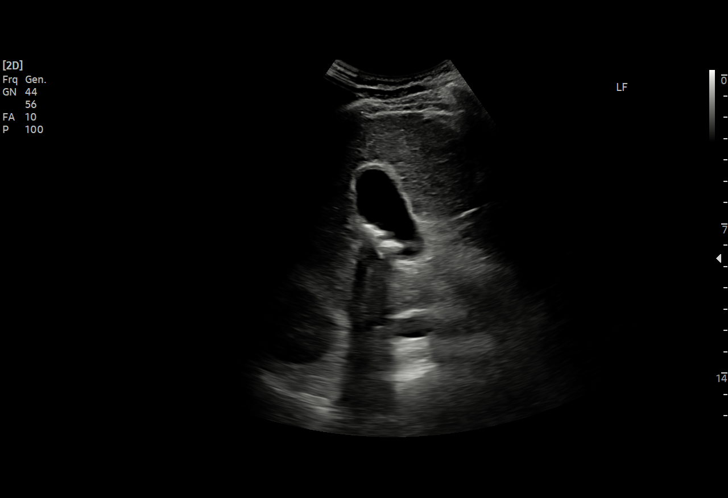
[im 55/95]
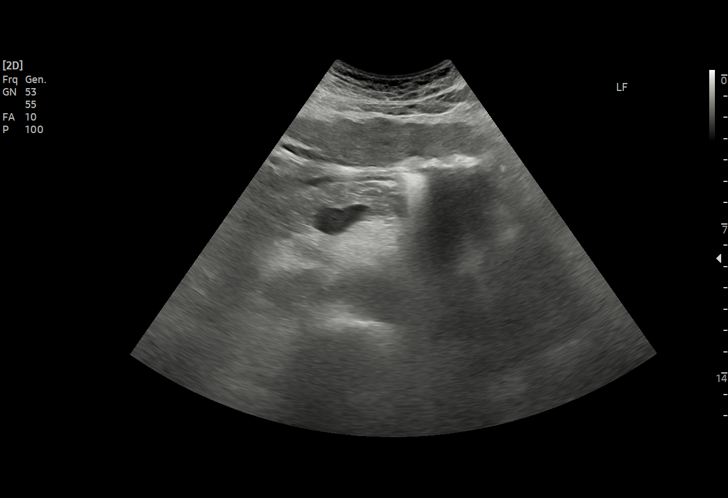
[im 59/95]
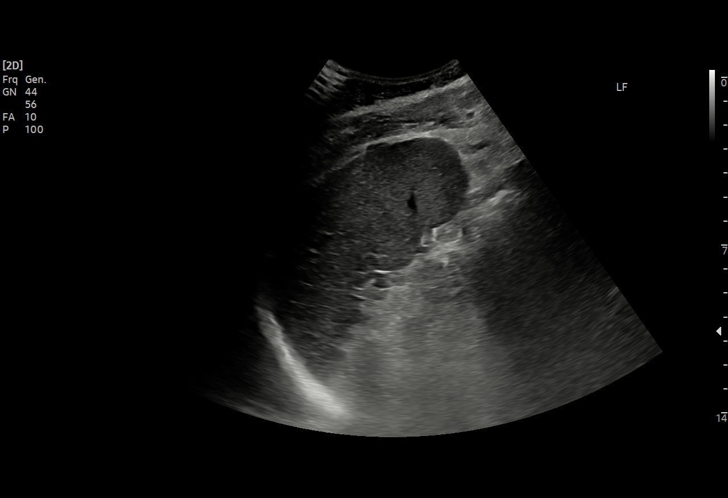
[im 67/95]
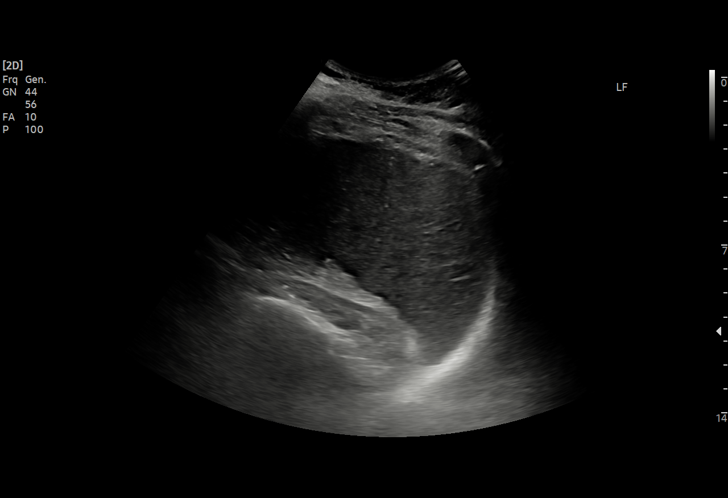
[im 75/95]
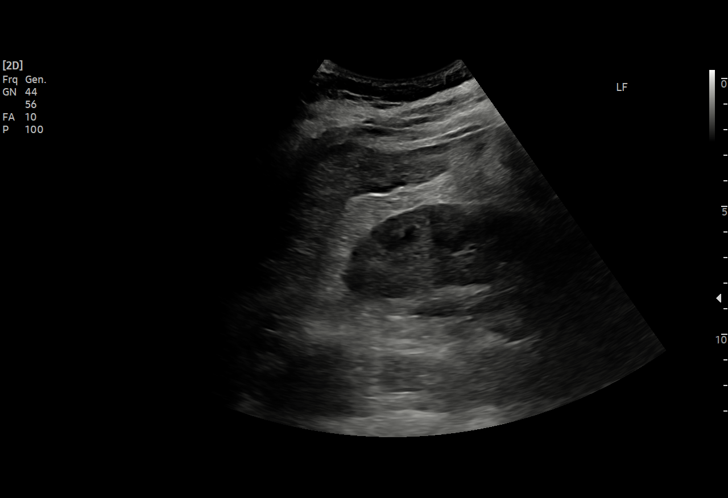
[im 79/95]
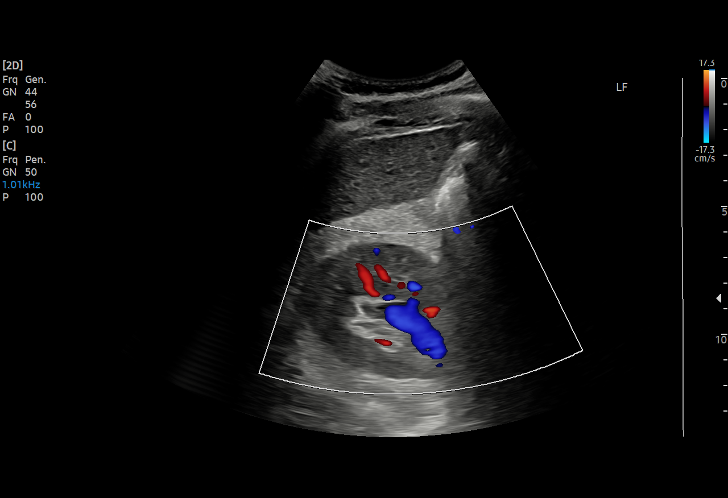
[im 87/95]
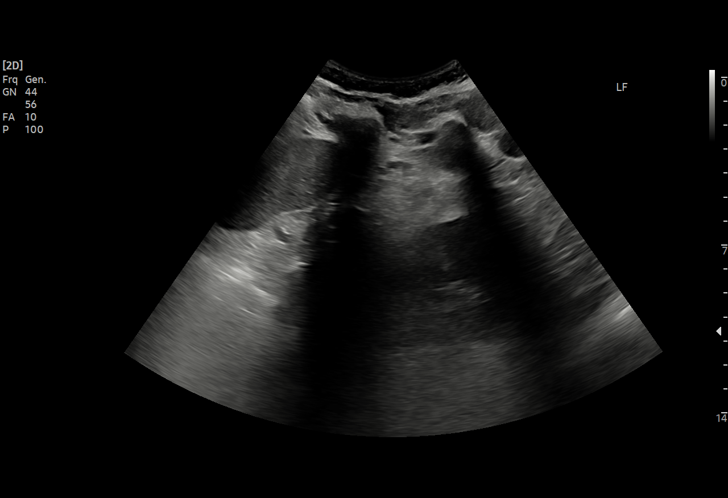
[im 95/95]
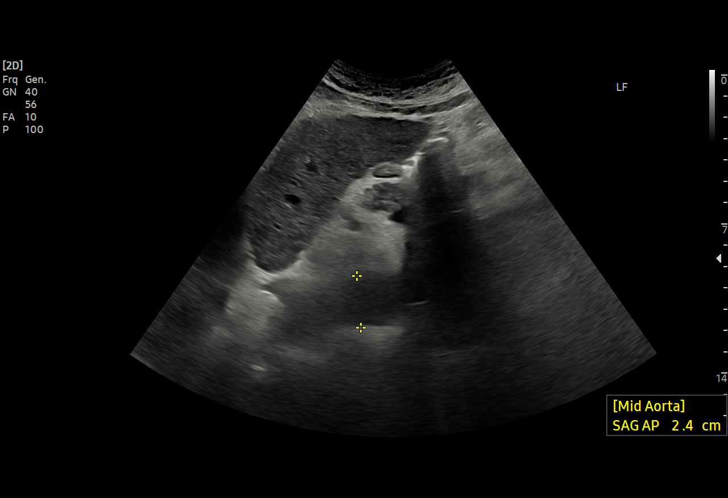

[15 of 25 positions shown; findings below may reference images not displayed]

FINDINGS: Gallbladder: Mild diffuse gallbladder wall thickening measuring up
to 4 mm. Multiple echogenic, shadowing stones within the gallbladder
lumen. No pericholecystic fluid visualized. No sonographic Murphy
sign noted by sonographer.

Common bile duct: Diameter: 5 mm.

Liver: No focal lesion identified. Coarsened, heterogeneous hepatic
echotexture with nodular surface contour compatible with known
cirrhosis. Portal vein is patent on color Doppler imaging with
normal direction of blood flow towards the liver.

IVC: No abnormality visualized.

Pancreas: Visualized portion unremarkable.

Spleen: Mildly enlarged.  Length: 8.8 cm.

Right Kidney: Length: 10.7 cm. Echogenicity within normal limits. No
mass or hydronephrosis visualized.

Left Kidney: Length: 11.4 cm. Echogenicity within normal limits. No
mass or hydronephrosis visualized.

Abdominal aorta: No aneurysm visualized.

Other findings: Trace perihepatic ascites.
IMPRESSION: 1. Cirrhotic morphology of the liver. No focal liver lesion
identified.
2. Cholelithiasis. Mild diffuse gallbladder wall thickening which
may be related to intrinsic liver disease. No pericholecystic fluid
or sonographic Murphy sign to suggest cholecystitis. If there is
clinical suspicion, consider nuclear medicine hepatobiliary scan.
3. Trace perihepatic ascites.
4. Mild splenomegaly.

## 2021-10-02 ENCOUNTER — Other Ambulatory Visit: Payer: Self-pay

## 2021-10-20 ENCOUNTER — Ambulatory Visit: Payer: Self-pay

## 2021-10-20 NOTE — Telephone Encounter (Signed)
Pt called in to confirm his nurse visit appt with provider on Tuesday and mentioned to me that he has a lump above his navel where he had his hernia surgery. Pt is concern but says that it doesn't cause pain. Pt is wanting provider to take a look at it if he will on Tuesday while he is at the office if possible. Pt says that he think that the area has gotten infected.     Chief Complaint: Quarter size lump at belly button.No pain. Has nurse visit 10/24/21, asking if Dr. Joya Gaskins could look at it. Symptoms: Above Frequency: Monday Pertinent Negatives: Patient denies fever Disposition: '[]'$ ED /'[]'$ Urgent Care (no appt availability in office) / '[]'$ Appointment(In office/virtual)/ '[]'$  [facility]/ '[]'$ Home Care/ '[]'$ Refused Recommended Disposition /'[]'$ [facility]/  F'[]'$ ollow-up with PCP Additional Notes: Please advise pt.  Answer Assessment - Initial Assessment Questions 1. APPEARANCE of SWELLING: "What does it look like?"     Oval shape 2. SIZE: "How large is the swelling?" (e.g., inches, cm; or compare to size of pinhead, tip of pen, eraser, coin, pea, grape, ping pong ball)      Quarter size 3. LOCATION: "Where is the swelling located?"     Belly button 4. ONSET: "When did the swelling start?"     Monday 5. COLOR: "What color is it?" "Is there more than one color?"     Skin 6. PAIN: "Is there any pain?" If Yes, ask: "How bad is the pain?" (e.g., scale 1-10; or mild, moderate, severe)     - NONE (0): no pain   - MILD (1-3): doesn't interfere with normal activities    - MODERATE (4-7): interferes with normal activities or awakens from sleep    - SEVERE (8-10): excruciating pain, unable to do any normal activities     No 7. ITCH: "Does it itch?" If Yes, ask: "How bad is the itch?"      No 8. CAUSE: "What do you think caused the swelling?" 9 OTHER SYMPTOMS: "Do you have any other symptoms?" (e.g., fever)     No  Protocols used: Skin Lump or Localized Swelling-A-AH

## 2021-10-20 NOTE — Telephone Encounter (Signed)
Is it ok to have 11 patient on Wed or Thurs

## 2021-10-20 NOTE — Telephone Encounter (Signed)
Not next week i have meetings PM both of those days. See if another provider can see him or refer to UC

## 2021-10-24 ENCOUNTER — Ambulatory Visit: Payer: Medicare Other

## 2021-10-24 NOTE — Telephone Encounter (Signed)
Noted, patient has walk in appointment for tomorrow

## 2021-10-25 ENCOUNTER — Ambulatory Visit: Payer: Medicare Other

## 2021-11-09 ENCOUNTER — Other Ambulatory Visit: Payer: Self-pay

## 2021-11-27 ENCOUNTER — Other Ambulatory Visit: Payer: Self-pay

## 2021-12-15 ENCOUNTER — Other Ambulatory Visit: Payer: Self-pay

## 2021-12-22 ENCOUNTER — Other Ambulatory Visit: Payer: Self-pay

## 2021-12-25 ENCOUNTER — Other Ambulatory Visit: Payer: Self-pay

## 2022-01-03 ENCOUNTER — Encounter: Payer: Self-pay | Admitting: Critical Care Medicine

## 2022-01-03 ENCOUNTER — Ambulatory Visit: Payer: Medicare Other | Attending: Critical Care Medicine | Admitting: Critical Care Medicine

## 2022-01-03 ENCOUNTER — Other Ambulatory Visit: Payer: Self-pay

## 2022-01-03 VITALS — BP 123/75 | HR 57 | Wt 228.8 lb

## 2022-01-03 DIAGNOSIS — I10 Essential (primary) hypertension: Secondary | ICD-10-CM

## 2022-01-03 DIAGNOSIS — I700 Atherosclerosis of aorta: Secondary | ICD-10-CM

## 2022-01-03 DIAGNOSIS — F1010 Alcohol abuse, uncomplicated: Secondary | ICD-10-CM

## 2022-01-03 DIAGNOSIS — D696 Thrombocytopenia, unspecified: Secondary | ICD-10-CM

## 2022-01-03 DIAGNOSIS — K2970 Gastritis, unspecified, without bleeding: Secondary | ICD-10-CM | POA: Diagnosis not present

## 2022-01-03 DIAGNOSIS — K7030 Alcoholic cirrhosis of liver without ascites: Secondary | ICD-10-CM

## 2022-01-03 DIAGNOSIS — K766 Portal hypertension: Secondary | ICD-10-CM

## 2022-01-03 DIAGNOSIS — Z0000 Encounter for general adult medical examination without abnormal findings: Secondary | ICD-10-CM

## 2022-01-03 DIAGNOSIS — K7031 Alcoholic cirrhosis of liver with ascites: Secondary | ICD-10-CM

## 2022-01-03 MED ORDER — SPIRONOLACTONE 25 MG PO TABS
25.0000 mg | ORAL_TABLET | Freq: Every day | ORAL | 1 refills | Status: DC
  Filled 2022-01-03: qty 90, 90d supply, fill #0
  Filled 2022-03-19: qty 90, 90d supply, fill #1

## 2022-01-03 MED ORDER — OMEPRAZOLE 40 MG PO CPDR
40.0000 mg | DELAYED_RELEASE_CAPSULE | Freq: Every day | ORAL | 2 refills | Status: DC
  Filled 2022-01-03 – 2022-01-09 (×2): qty 60, 60d supply, fill #0
  Filled 2022-03-19: qty 60, 60d supply, fill #1
  Filled 2022-06-14: qty 60, 60d supply, fill #2

## 2022-01-03 MED ORDER — ATORVASTATIN CALCIUM 20 MG PO TABS
20.0000 mg | ORAL_TABLET | Freq: Every day | ORAL | 3 refills | Status: DC
  Filled 2022-01-03 – 2022-03-19 (×2): qty 90, 90d supply, fill #0

## 2022-01-03 MED ORDER — AMLODIPINE BESYLATE 5 MG PO TABS
5.0000 mg | ORAL_TABLET | Freq: Every day | ORAL | 2 refills | Status: DC
  Filled 2022-01-03 – 2022-03-19 (×2): qty 90, 90d supply, fill #0

## 2022-01-03 MED ORDER — NADOLOL 80 MG PO TABS
80.0000 mg | ORAL_TABLET | Freq: Every day | ORAL | 3 refills | Status: DC
  Filled 2022-01-03 – 2022-01-09 (×2): qty 60, 60d supply, fill #0
  Filled 2022-03-19: qty 30, 30d supply, fill #1
  Filled 2022-05-25: qty 30, 30d supply, fill #2

## 2022-01-03 MED ORDER — FUROSEMIDE 20 MG PO TABS
20.0000 mg | ORAL_TABLET | Freq: Every day | ORAL | 3 refills | Status: DC | PRN
Start: 2022-01-03 — End: 2022-06-14
  Filled 2022-01-03: qty 30, 30d supply, fill #0

## 2022-01-03 NOTE — Patient Instructions (Signed)
Medications refilled Resume aldactone one daily '25mg'$   Labs metabolic panel  Return Dr Joya Gaskins 5 months

## 2022-01-03 NOTE — Assessment & Plan Note (Signed)
Continue nadolol and resume Aldactone

## 2022-01-03 NOTE — Progress Notes (Signed)
Annual Wellness Visit     Patient: Richard Yang, Male    DOB: 11/07/1955, 66 y.o.   MRN: [id number]  Subjective  Chief Complaint  Patient presents with   Gastroesophageal Reflux    With some chest pain    Mass    Above the navel     Richard Yang is a 66 y.o. male who presents today for his Annual Wellness Visit. He reports consuming a low sodium diet. Home exercise routine includes walking 0.5 hrs per day. He generally feels well. He reports sleeping well. He does not have additional problems to discuss today.   11/29 This is a Medicare wellness visit and follow-up for this patient who has history of hypertension aortic atherosclerosis portal hypertension cirrhotic liver disease with chronic hepatitis C and alcohol use.  Patient longer is using alcohol at this time.  He has history of esophageal varices in the past but no recent bleeding.  He had an inguinal hernia repair and had some mild tenderness in the umbilical area.  He needs follow-up labs and refills on medications.  He had not been taking his Aldactone and this needs to be resumed.  Blood pressure on arrival is good 123/75.  The patient's already received his flu vaccine in October      Vision:Within last year   Patient Active Problem List   Diagnosis Date Noted   Testicular swelling 08/29/2021   Seasonal allergic rhinitis 08/29/2021   Encounter for health-related screening 08/29/2021   Hx of esophageal varices 10/03/2020   Hepatic cirrhosis ([place]) 10/03/2020   Portal hypertensive gastropathy ([place]) 10/03/2020   Other cirrhosis of liver ([place]) 11/16/2019   Ascites of liver 11/16/2019   Chronic hepatitis C without hepatic coma ([place]) 09/23/2019   Portal hypertension ([place]) 08/06/2019   Thrombocytopenia ([place]) 08/06/2019   Inguinal hernia 07/13/2019   Aortic atherosclerosis ([place]) 07/13/2019   Alcohol abuse 07/13/2019   Liver cirrhosis, alcoholic ([place]) 16/02/930   Hypertension 06/24/2019   Past Medical  History:  Diagnosis Date   Alcohol use disorder, moderate, in early remission, dependence ([place]) 11/16/2019   Ascites    Cirrhosis ([facility])    Elevated AFP    Hepatitis C    Incarcerated umbilical hernia 3/55/7322   Inguinal hernia    Portal hypertension ([place])    Substance abuse ([place])    alcohol abuse hx   Umbilical hernia    Past Surgical History:  Procedure Laterality Date   INGUINAL HERNIA REPAIR N/A 03/29/2020   Procedure: OPEN UMBILICAL HERNIA REPAIR, BOWEL RESECTION;  Surgeon: Felicie Morn, MD;  Location: [facility];  Service: General;  Laterality: N/A;   IR PARACENTESIS  09/16/2019   IR PARACENTESIS  10/09/2019   IR PARACENTESIS  12/09/2019   Social History   Tobacco Use   Smoking status: Never   Smokeless tobacco: Never  Vaping Use   Vaping Use: Never used  Substance Use Topics   Alcohol use: Yes    Alcohol/week: 2.0 standard drinks of alcohol    Types: 2 Glasses of wine per week   Drug use: Not Currently   Social History   Socioeconomic History   Marital status: Single    Spouse name: Not on file   Number of children: Not on file   Years of education: Not on file   Highest education level: Not on file  Occupational History   Not on file  Tobacco Use   Smoking status: Never   Smokeless tobacco: Never  Vaping  Use   Vaping Use: Never used  Substance and Sexual Activity   Alcohol use: Yes    Alcohol/week: 2.0 standard drinks of alcohol    Types: 2 Glasses of wine per week   Drug use: Not Currently   Sexual activity: Not Currently    Partners: Male  Other Topics Concern   Not on file  Social History Narrative   Not on file   Social Determinants of Health   Financial Resource Strain: Not on file  Food Insecurity: Not on file  Transportation Needs: Not on file  Physical Activity: Not on file  Stress: Not on file  Social Connections: Not on file  Intimate Partner Violence: Not on file   Family Status  Relation Name Status   Neg Hx  (Not Specified)    Family History  Problem Relation Age of Onset   Stomach cancer Neg Hx    Colon cancer Neg Hx    Esophageal cancer Neg Hx    Pancreatic cancer Neg Hx    Colon polyps Neg Hx    Rectal cancer Neg Hx    No Known Allergies    Medications: Outpatient Medications Prior to Visit  Medication Sig   amLODipine (NORVASC) 5 MG tablet Take 1 tablet (5 mg total) by mouth daily.   atorvastatin (LIPITOR) 20 MG tablet Take 1 tablet (20 mg total) by mouth daily.   furosemide (LASIX) 20 MG tablet Take 1 tablet (20 mg total) by mouth daily as needed for fluid or edema (scrotal swelling).   nadolol (CORGARD) 80 MG tablet Take 1 tablet (80 mg total) by mouth daily.   omeprazole (PRILOSEC) 40 MG capsule Take 1 capsule (40 mg total) by mouth daily.   spironolactone (ALDACTONE) 100 MG tablet TAKE 1 TABLET (100 MG TOTAL) BY MOUTH DAILY.   No facility-administered medications prior to visit.    No Known Allergies  Patient Care Team: Elsie Stain, MD as PCP - General (Pulmonary Disease)  Review of Systems  Constitutional:  Negative for chills, diaphoresis, fever, malaise/fatigue and weight loss.  HENT:  Negative for congestion, hearing loss, nosebleeds, sore throat and tinnitus.   Eyes:  Negative for blurred vision, photophobia and redness.  Respiratory:  Negative for cough, hemoptysis, sputum production, shortness of breath, wheezing and stridor.   Cardiovascular:  Negative for chest pain, palpitations, orthopnea, claudication, leg swelling and PND.  Gastrointestinal:  Negative for abdominal pain, blood in stool, constipation, diarrhea, heartburn, nausea and vomiting.  Genitourinary:  Negative for dysuria, flank pain, frequency, hematuria and urgency.  Musculoskeletal:  Negative for back pain, falls, joint pain, myalgias and neck pain.  Skin:  Negative for itching and rash.  Neurological:  Negative for dizziness, tingling, tremors, sensory change, speech change, focal weakness, seizures, loss of  consciousness, weakness and headaches.  Endo/Heme/Allergies:  Negative for environmental allergies and polydipsia. Does not bruise/bleed easily.  Psychiatric/Behavioral:  Negative for depression, memory loss, substance abuse and suicidal ideas. The patient is not nervous/anxious and does not have insomnia.         Objective  BP (!) 159/89   Pulse (!) 57   Wt 228 lb 12.8 oz (103.8 kg)   SpO2 96%   BMI 32.83 kg/m  BP Readings from Last 3 Encounters:  01/03/22 123/75  08/29/21 129/79  07/20/21 131/73   Wt Readings from Last 3 Encounters:  01/03/22 228 lb 12.8 oz (103.8 kg)  08/29/21 221 lb 9.6 oz (100.5 kg)  06/29/21 219 lb (99.3 kg)  Physical Exam Vitals reviewed.  Constitutional:      Appearance: Normal appearance. He is well-developed. He is not diaphoretic.  HENT:     Head: Normocephalic and atraumatic.     Nose: Nose normal. No nasal deformity, septal deviation, mucosal edema or rhinorrhea.     Right Sinus: No maxillary sinus tenderness or frontal sinus tenderness.     Left Sinus: No maxillary sinus tenderness or frontal sinus tenderness.     Mouth/Throat:     Mouth: Mucous membranes are moist.     Pharynx: Oropharynx is clear. No oropharyngeal exudate.  Eyes:     General: No scleral icterus.    Conjunctiva/sclera: Conjunctivae normal.     Pupils: Pupils are equal, round, and reactive to light.  Neck:     Thyroid: No thyromegaly.     Vascular: No carotid bruit or JVD.     Trachea: Trachea normal. No tracheal tenderness or tracheal deviation.  Cardiovascular:     Rate and Rhythm: Normal rate and regular rhythm.     Chest Wall: PMI is not displaced.     Pulses: Normal pulses. No decreased pulses.     Heart sounds: Normal heart sounds, S1 normal and S2 normal. Heart sounds not distant. No murmur heard.    No systolic murmur is present.     No diastolic murmur is present.     No friction rub. No gallop. No S3 or S4 sounds.  Pulmonary:     Effort: Pulmonary  effort is normal. No tachypnea, accessory muscle usage or respiratory distress.     Breath sounds: No stridor. No decreased breath sounds, wheezing, rhonchi or rales.  Chest:     Chest wall: No tenderness.  Abdominal:     General: Bowel sounds are normal. There is distension.     Palpations: Abdomen is soft. Abdomen is not rigid. There is no mass.     Tenderness: There is no abdominal tenderness. There is no right CVA tenderness, left CVA tenderness, guarding or rebound.     Hernia: No hernia is present.     Comments: Very minimal fluid wave mild ascites  Musculoskeletal:        General: Normal range of motion.     Cervical back: Normal range of motion and neck supple. No edema, erythema or rigidity. No muscular tenderness. Normal range of motion.  Lymphadenopathy:     Head:     Right side of head: No submental or submandibular adenopathy.     Left side of head: No submental or submandibular adenopathy.     Cervical: No cervical adenopathy.  Skin:    General: Skin is warm and dry.     Coloration: Skin is not pale.     Findings: No rash.     Nails: There is no clubbing.  Neurological:     General: No focal deficit present.     Mental Status: He is alert and oriented to person, place, and time. Mental status is at baseline.     Sensory: No sensory deficit.  Psychiatric:        Mood and Affect: Mood normal.        Speech: Speech normal.        Behavior: Behavior normal.        Thought Content: Thought content normal.        Judgment: Judgment normal.       Most recent functional status assessment:     No data to display  Most recent fall risk assessment:    01/03/2022   10:04 AM  Fall Risk   Falls in the past year? 0  Number falls in past yr: 0  Injury with Fall? 0  Risk for fall due to : No Fall Risks    Most recent depression screenings:    01/03/2022   10:06 AM 08/29/2021    9:22 AM  PHQ 2/9 Scores  PHQ - 2 Score 0 0   Most recent cognitive  screening:     No data to display         Most recent Audit-C alcohol use screening no alcohol use currently  in women, and 4 or more in men indicates increased risk for alcohol abuse, EXCEPT if all of the points are from question 1   Vision/Hearing Screen: Has upcoming eye exam hearing is normal  Last CBC Lab Results  Component Value Date   WBC 5.3 06/29/2021   HGB 13.8 06/29/2021   HCT 40.8 06/29/2021   MCV 92 06/29/2021   MCH 31.1 06/29/2021   RDW 13.7 06/29/2021   PLT 121 (L) 33/29/5188   Last metabolic panel Lab Results  Component Value Date   GLUCOSE 99 08/29/2021   NA 137 08/29/2021   K 4.6 08/29/2021   CL 104 08/29/2021   CO2 20 08/29/2021   BUN 18 08/29/2021   CREATININE 1.23 08/29/2021   EGFR 64 08/29/2021   CALCIUM 9.7 08/29/2021   PROT 7.6 06/29/2021   ALBUMIN 4.4 06/29/2021   LABGLOB 3.2 06/29/2021   AGRATIO 1.4 06/29/2021   BILITOT 0.7 06/29/2021   ALKPHOS 92 06/29/2021   AST 36 06/29/2021   ALT 28 06/29/2021   ANIONGAP 8 04/01/2020   Last lipids Lab Results  Component Value Date   CHOL 160 06/29/2021   HDL 87 06/29/2021   LDLCALC 57 06/29/2021   TRIG 89 06/29/2021   CHOLHDL 1.8 06/29/2021   Last thyroid functions Lab Results  Component Value Date   TSH 2.820 08/06/2019   T4TOTAL 6.5 08/06/2019   Last vitamin D No results found for: "25OHVITD2", "25OHVITD3", "VD25OH" Last vitamin B12 and Folate No results found for: "VITAMINB12", "FOLATE"  No results found for any visits on 01/03/22.    Assessment & Plan   Annual wellness visit done today including the all of the following: Reviewed patient's Family Medical History Reviewed and updated list of patient's medical providers Assessment of cognitive impairment was done Assessed patient's functional ability Established a written schedule for health screening [place] Completed and Reviewed  Exercise Activities and Dietary recommendations  Goals   None      Immunization History  Administered Date(s) Administered   Hep A / Hep B 10/20/2020   Hepatitis A, Adult 11/04/2019   Hepatitis B, adult 09/17/2019, 11/04/2019, 12/04/2019   Hepb-cpg 11/21/2020   Influenza,inj,Quad PF,6+ Mos 01/05/2020, 10/19/2020, 11/14/2021   Moderna Sars-Covid-2 Vaccination 07/13/2019, 08/10/2019   PNEUMOCOCCAL CONJUGATE-20 06/29/2021   Pneumococcal Conjugate-13 01/28/2020   Tdap 02/11/2020   Zoster Recombinat (Shingrix) 08/29/2021    Health Maintenance  Topic Date Due   COVID-19 Vaccine (3 - 2023-24 season) 10/06/2021   Medicare Annual Wellness (AWV)  10/19/2021   Zoster Vaccines- Shingrix (2 of 2) 10/24/2021   COLONOSCOPY (Pts 45-5yr Insurance coverage will need to be confirmed)  08/11/2030   Pneumonia Vaccine 67 Years old  Completed   INFLUENZA VACCINE  Completed   Hepatitis C Screening  Completed   HPV VACCINES  Aged Out  Discussed health benefits of physical activity, and encouraged him to engage in regular exercise appropriate for his age and condition.    Problem List Items Addressed This Visit   None   No follow-ups on file.     Asencion Noble, MD

## 2022-01-03 NOTE — Assessment & Plan Note (Signed)
Resume Aldactone

## 2022-01-03 NOTE — Assessment & Plan Note (Signed)
Not currently drinking alcohol 

## 2022-01-03 NOTE — Assessment & Plan Note (Signed)
Continue with cholesterol management check lipid panel

## 2022-01-03 NOTE — Assessment & Plan Note (Signed)
Blood pressure well controlled no changes made ?

## 2022-01-03 NOTE — Progress Notes (Deleted)
Established Patient Office Visit  Subjective   Patient ID: Richard Yang, male    DOB: [birth year]  Age: 66 y.o. MRN: [id number]  No chief complaint on file.   08/29/21 Richard Yang is a 66 year old male presenting today for follow up. 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  His blood pressure today is 131/79. He does take his pressures at home and reports they usually run about 129/78. He is using amlodipine, nadolol and spironolactone daily. Denies any symptoms of easy bruising or bleeding.   At the last visit in May 2023, Richard Yang reported a recent increase in his alcohol use Richard Yang liquor), however now he reports about 2 glasses a wine a week. He has been actively trying to live a better lifestyle and even took a cooking class on healthy eating. Reports he has gained some weight over the last few months, but believes it is directly correlated to his recent diet. Denies sudden weight gain or abnormal swelling.   In May, he also had some groin/ testicular pain and swelling that has now improved greatly. He is using Lasix as needed with swelling.   He does have allergy symptoms often and uses a pill from the store which he does not know the name of. This does seem to cover his symptoms. He will obtain the name and tell us at the next visit.   11/29 Hypertension - Primary       Well controlled at this time. BP today 131/79. Continue home BP measurements  Patient is cutting his 10 mg amlodipine in half for daily dosage,  will send 5 mg tablets to pharmacy  Continue daily amlodipine, nadalol and spironolactone    The following Lifestyle Medicine recommendations according to [name]) were discussed and offered to patient who agrees to start the journey:  A. Whole Foods, Plant-based plate comprising of fruits and vegetables, plant-based proteins, whole-grain carbohydrates was  discussed in detail with the patient.   A list for source of those nutrients were also provided to the patient.  Patient will use only water or unsweetened tea for hydration. B.  The need to stay away from risky substances including alcohol, smoking; obtaining 7 to 9 hours of restorative sleep, at least 150 minutes of moderate intensity exercise weekly, the importance of healthy social connections,  and stress reduction techniques were discussed.          Relevant Medications   amLODipine (NORVASC) 5 MG tablet   nadolol (CORGARD) 80 MG tablet   spironolactone (ALDACTONE) 100 MG tablet   Other Relevant Orders   BMP8+eGFR   Aortic atherosclerosis (HCC)      Continue atorvastatin, amlodipine, nadalol and spironolactone       Relevant Medications   amLODipine (NORVASC) 5 MG tablet   nadolol (CORGARD) 80 MG tablet   spironolactone (ALDACTONE) 100 MG tablet      Respiratory   Seasonal allergic rhinitis      Per patient, he is using over the counter medication. He is ok to continue He will let us know the name at follow up        Patient Active Problem List   Diagnosis Date Noted   Testicular swelling 08/29/2021   Seasonal allergic rhinitis 08/29/2021   Encounter for health-related screening 08/29/2021   Hx of esophageal varices 10/03/2020   Hepatic cirrhosis ([place]) 10/03/2020   Portal hypertensive gastropathy (  [place]) 10/03/2020   Other cirrhosis of liver ([place]) 11/16/2019   Ascites of liver 11/16/2019   Chronic hepatitis C without hepatic coma ([place]) 09/23/2019   Portal hypertension ([place]) 08/06/2019   Thrombocytopenia ([place]) 08/06/2019   Inguinal hernia 07/13/2019   Aortic atherosclerosis ([place]) 07/13/2019   Alcohol abuse 07/13/2019   Liver cirrhosis, alcoholic ([place]) 53/00/5110   Hypertension 06/24/2019   Past Medical History:  Diagnosis Date   Alcohol use disorder, moderate, in early remission, dependence ([place]) 11/16/2019   Ascites    Cirrhosis (HCC)    Elevated AFP     Hepatitis C    Incarcerated umbilical hernia 03/18/1733   Inguinal hernia    Portal hypertension ([place])    Substance abuse ([place])    alcohol abuse hx   Umbilical hernia    Past Surgical History:  Procedure Laterality Date   INGUINAL HERNIA REPAIR N/A 03/29/2020   Procedure: OPEN UMBILICAL HERNIA REPAIR, BOWEL RESECTION;  Surgeon: Felicie Morn, MD;  Location: [facility];  Service: General;  Laterality: N/A;   IR PARACENTESIS  09/16/2019   IR PARACENTESIS  10/09/2019   IR PARACENTESIS  12/09/2019   Social History   Tobacco Use   Smoking status: Never   Smokeless tobacco: Never  Vaping Use   Vaping Use: Never used  Substance Use Topics   Alcohol use: Yes    Alcohol/week: 2.0 standard drinks of alcohol    Types: 2 Glasses of wine per week   Drug use: Not Currently   Family History  Problem Relation Age of Onset   Stomach cancer Neg Hx    Colon cancer Neg Hx    Esophageal cancer Neg Hx    Pancreatic cancer Neg Hx    Colon polyps Neg Hx    Rectal cancer Neg Hx    No Known Allergies    Review of Systems  Constitutional:  Negative for chills, diaphoresis, fever, malaise/fatigue and weight loss.       Weight gain  HENT: Negative.  Negative for congestion, hearing loss, nosebleeds, sore throat and tinnitus.   Eyes: Negative.  Negative for blurred vision, photophobia and redness.  Respiratory: Negative.  Negative for cough, hemoptysis, sputum production, shortness of breath, wheezing and stridor.   Cardiovascular:  Positive for leg swelling. Negative for chest pain, palpitations, orthopnea, claudication and PND.  Gastrointestinal: Negative.  Negative for abdominal pain, blood in stool, constipation, diarrhea, heartburn, nausea and vomiting.  Genitourinary: Negative.  Negative for dysuria, flank pain, frequency, hematuria and urgency.  Musculoskeletal: Negative.  Negative for back pain, falls, joint pain, myalgias and neck pain.  Skin: Negative.  Negative for itching and rash.   Neurological: Negative.  Negative for dizziness, tingling, tremors, sensory change, speech change, focal weakness, seizures, loss of consciousness, weakness and headaches.  Endo/Heme/Allergies: Negative.  Negative for environmental allergies and polydipsia. Does not bruise/bleed easily.  Psychiatric/Behavioral: Negative.  Negative for depression, memory loss, substance abuse and suicidal ideas. The patient is not nervous/anxious and does not have insomnia.       Objective:     There were no vitals taken for this visit.    Physical Exam Constitutional:      Appearance: Normal appearance.  HENT:     Right Ear: Tympanic membrane, ear canal and external ear normal.     Left Ear: Tympanic membrane, ear canal and external ear normal.     Nose: Nose normal.     Mouth/Throat:     Mouth: Mucous membranes are moist.  Pharynx: Oropharynx is clear.  Eyes:     Conjunctiva/sclera: Conjunctivae normal.     Pupils: Pupils are equal, round, and reactive to light.  Cardiovascular:     Rate and Rhythm: Normal rate and regular rhythm.  Pulmonary:     Effort: Pulmonary effort is normal.     Breath sounds: Normal breath sounds.  Abdominal:     General: Bowel sounds are normal.     Palpations: There is fluid wave.     Comments: Ascitic fluid wave is palpated but is not tense    Small right inguinal hernia easily reducible   Genitourinary:    Penis: Normal.      Testes: Normal.     Comments: Mild amount of fluid in scrotum sac much improved from previous Musculoskeletal:        General: Normal range of motion.     Right lower leg: Edema present.     Left lower leg: Edema present.     Comments: Trace edema LE  Skin:    General: Skin is warm.  Neurological:     General: No focal deficit present.     Mental Status: He is alert and oriented to person, place, and time.  Psychiatric:        Mood and Affect: Mood normal.        Behavior: Behavior normal.      No results found for any  visits on 01/03/22.     The 10-year ASCVD risk score (Arnett DK, et al., 2019) is: 14%    Assessment & Plan:   Problem List Items Addressed This Visit   None 38 minutes spent on history and physical patient education complex decision making high  No follow-ups on file.    Asencion Noble, MD

## 2022-01-03 NOTE — Assessment & Plan Note (Signed)
Reassess CBC 

## 2022-01-04 LAB — COMPREHENSIVE METABOLIC PANEL
ALT: 25 IU/L (ref 0–44)
AST: 27 IU/L (ref 0–40)
Albumin/Globulin Ratio: 1.6 (ref 1.2–2.2)
Albumin: 4.6 g/dL (ref 3.9–4.9)
Alkaline Phosphatase: 88 IU/L (ref 44–121)
BUN/Creatinine Ratio: 15 (ref 10–24)
BUN: 19 mg/dL (ref 8–27)
Bilirubin Total: 0.7 mg/dL (ref 0.0–1.2)
CO2: 24 mmol/L (ref 20–29)
Calcium: 9.6 mg/dL (ref 8.6–10.2)
Chloride: 101 mmol/L (ref 96–106)
Creatinine, Ser: 1.29 mg/dL — ABNORMAL HIGH (ref 0.76–1.27)
Globulin, Total: 2.8 g/dL (ref 1.5–4.5)
Glucose: 101 mg/dL — ABNORMAL HIGH (ref 70–99)
Potassium: 4.1 mmol/L (ref 3.5–5.2)
Sodium: 138 mmol/L (ref 134–144)
Total Protein: 7.4 g/dL (ref 6.0–8.5)
eGFR: 61 mL/min/{1.73_m2} (ref >59)

## 2022-01-04 NOTE — Progress Notes (Signed)
Let pt know liver/kidney normal potassium normal  no change from recommendations at OV

## 2022-01-05 ENCOUNTER — Telehealth: Payer: Self-pay

## 2022-01-05 NOTE — Telephone Encounter (Signed)
Pt was called and vm was left, Information has been sent to nurse pool.   

## 2022-01-05 NOTE — Telephone Encounter (Signed)
-----   Message from Elsie Stain, MD sent at 01/04/2022  5:40 AM EST ----- Let pt know liver/kidney normal potassium normal  no change from recommendations at OV

## 2022-01-09 ENCOUNTER — Other Ambulatory Visit: Payer: Self-pay

## 2022-03-02 ENCOUNTER — Other Ambulatory Visit (HOSPITAL_COMMUNITY): Payer: Self-pay

## 2022-03-19 ENCOUNTER — Other Ambulatory Visit: Payer: Self-pay

## 2022-03-31 IMAGING — CT CT ABD-PELV W/ CM
3 of 6 series · 16 of 46 positions shown, 18 images · IV contrast (OMNIPAQUE)
Comparison: 03/29/2020

CLINICAL DATA: Left lower quadrant pain. Cirrhosis. Chronic
hepatitis-C.

EXAM:
CT ABDOMEN AND PELVIS WITH CONTRAST
TECHNIQUE: Multidetector CT imaging of the abdomen and pelvis was performed
using the standard protocol following bolus administration of
intravenous contrast.
CONTRAST:  75mL OMNIPAQUE IOHEXOL 350 MG/ML SOLN

[Series 2: axial st · axial · 0.94mm/px · z∈[+1363,+1443]mm · 3 of 41 slices shown (1 of 2)]
[im 9/41  soft-tissue]
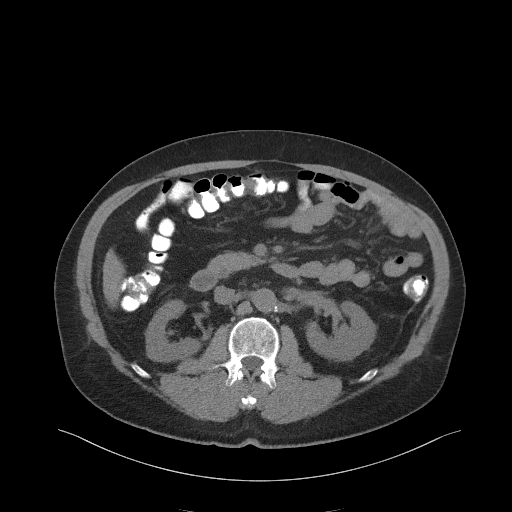
[im 17/41  soft-tissue]
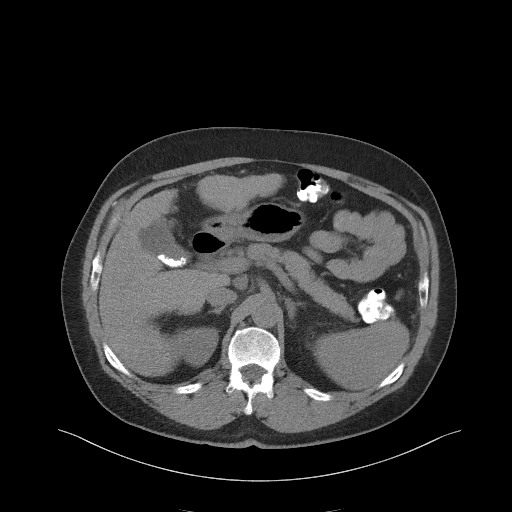
[im 25/41  soft-tissue]
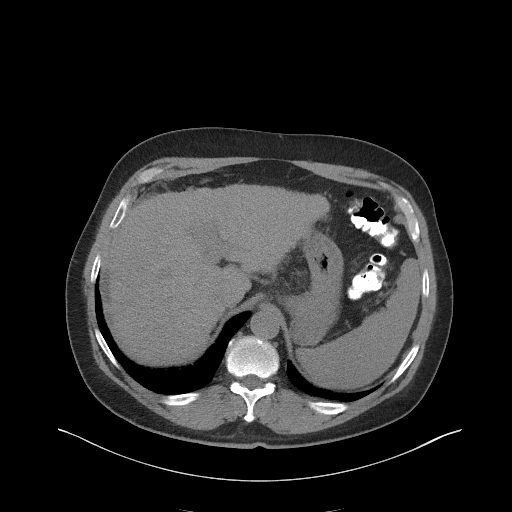

[Series 4: axial st · axial · 0.94mm/px · z∈[+1103,+1478]mm · 10 of 93 slices shown, 12 images (2 of 2)]
[im 9/93  soft-tissue]
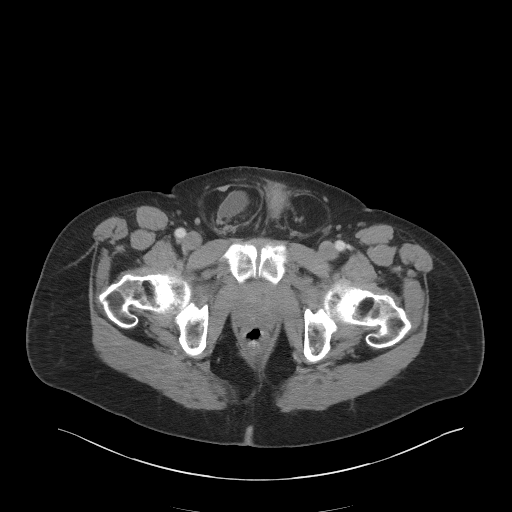
[im 9/93  bone]
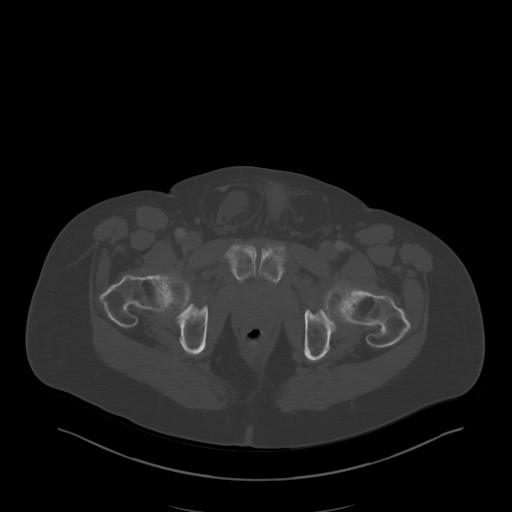
[im 17/93  soft-tissue]
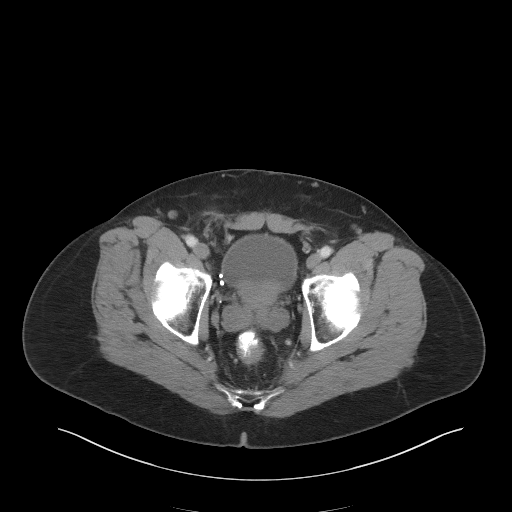
[im 26/93  soft-tissue]
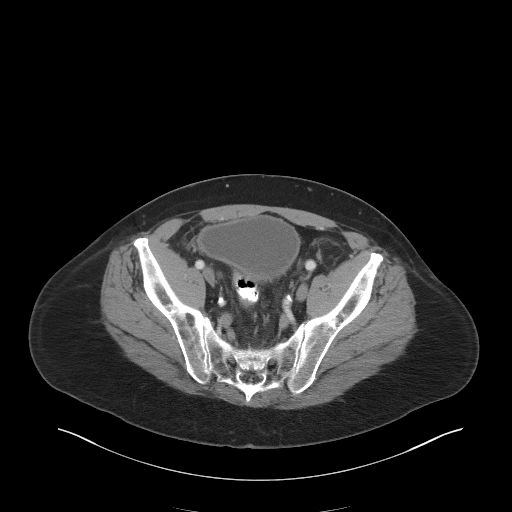
[im 34/93  soft-tissue]
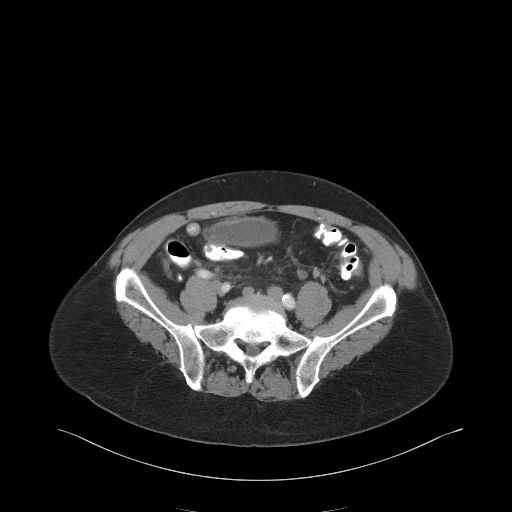
[im 42/93  soft-tissue]
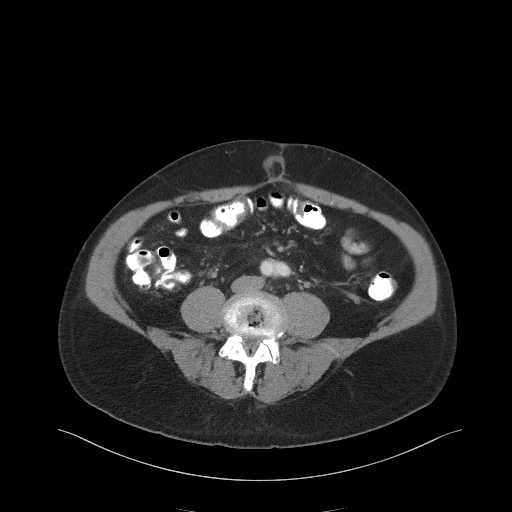
[im 51/93  soft-tissue]
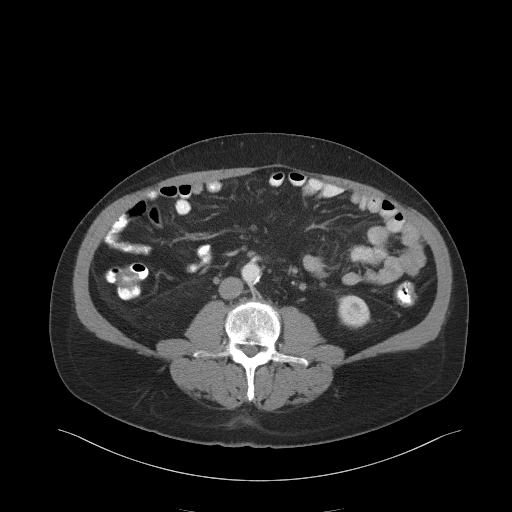
[im 59/93  soft-tissue]
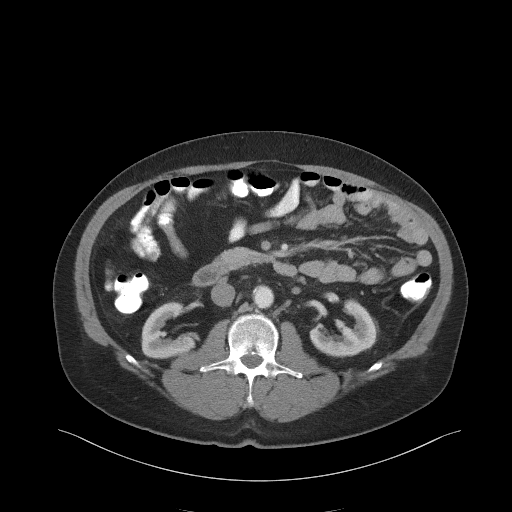
[im 67/93  soft-tissue]
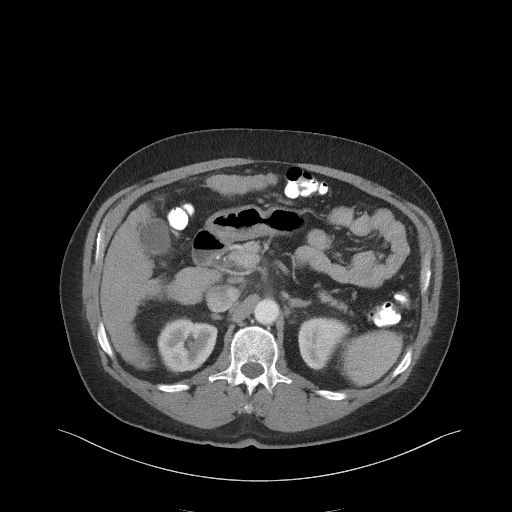
[im 76/93  soft-tissue]
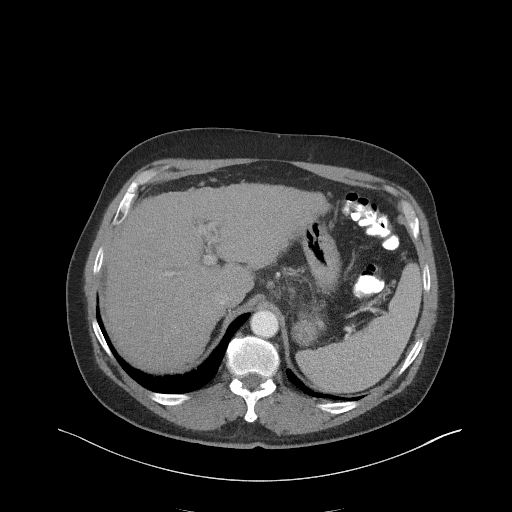
[im 76/93  bone]
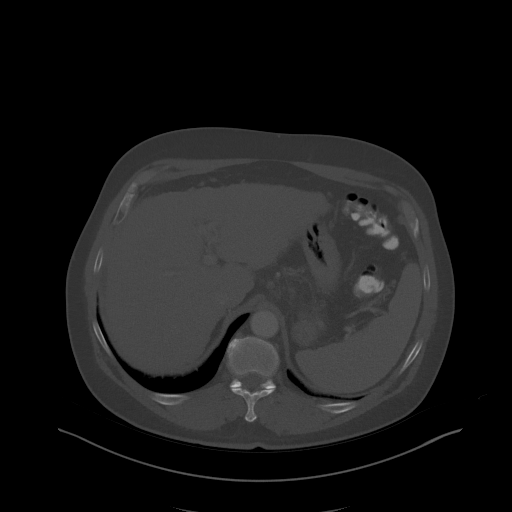
[im 84/93  soft-tissue]
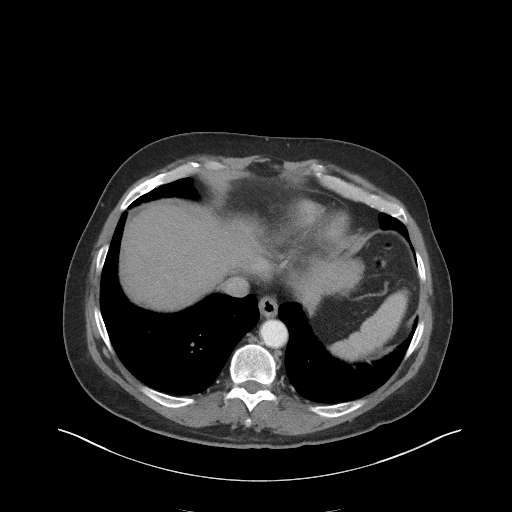

[Series 7: coronal st · coronal · 1.00mm/px · 3 of 81 slices shown]
[im 27/81  soft-tissue]
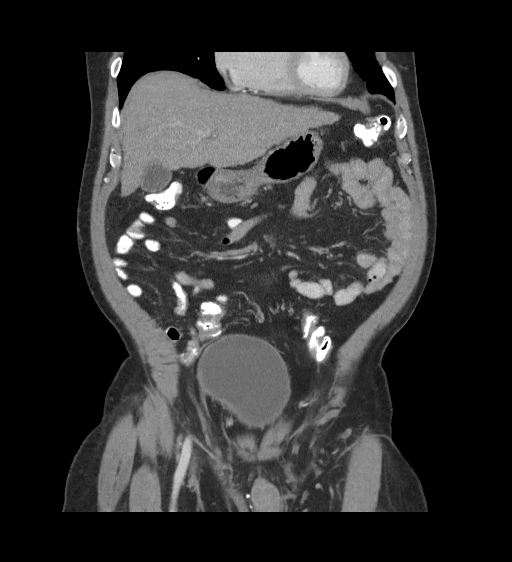
[im 36/81  soft-tissue]
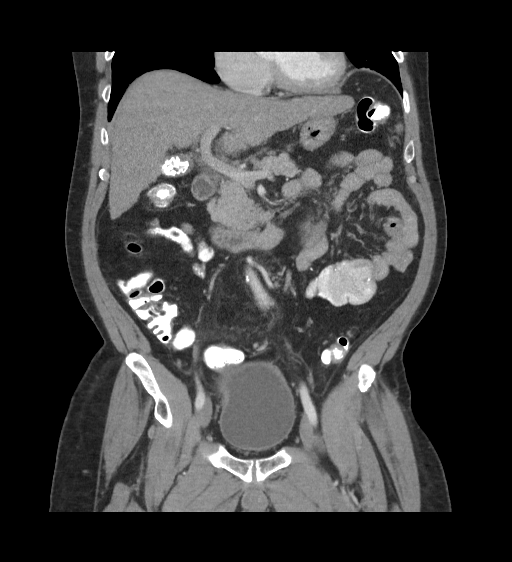
[im 45/81  soft-tissue]
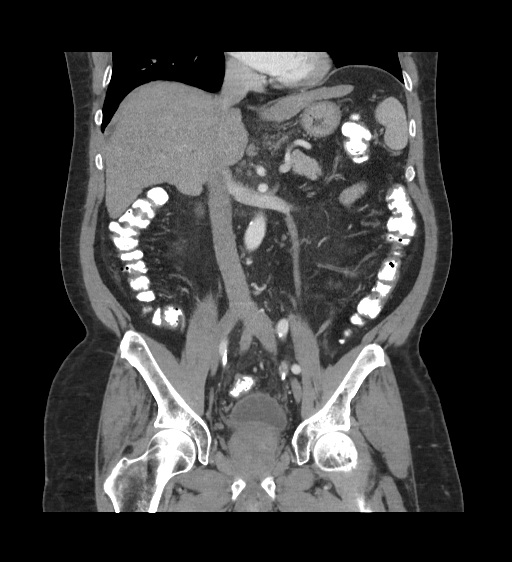

[16 of 46 positions shown; findings below may reference images not displayed]

FINDINGS: Lower Chest: No acute findings.

Hepatobiliary: Hepatic cirrhosis again demonstrated. No hepatic
masses identified. Multiple sub-cm calcified gallstones are again
seen, however there is no evidence of cholecystitis or biliary
ductal dilatation.

Pancreas:  No mass or inflammatory changes.

Spleen: Within normal limits in size and appearance.

Adrenals/Urinary Tract: No masses identified. A few small left renal
cysts remains stable. No evidence of ureteral calculi or
hydronephrosis. Diffuse bladder wall thickening is seen, likely due
to chronic bladder outlet obstruction given enlarged prostate.

Stomach/Bowel: No evidence of obstruction, inflammatory process or
abnormal fluid collections. Normal appendix visualized. Normal
appendix visualized.

Vascular/Lymphatic: No pathologically enlarged lymph nodes. Shotty
lymph nodes in the porta hepatis and gastrohepatic ligament remains
stable, likely reactive in etiology. No acute vascular findings.
Recanalization of paraumbilical veins is consistent with portal
venous hypertension.

Reproductive: Moderately enlarged prostate gland, without
significant change.

Other: Stable small left inguinal hernia containing only fat, and
moderate right inguinal hernia containing fat and fluid. A small
umbilical hernia is seen which contains only fat, and is
significantly decreased in size since prior study.

Musculoskeletal:  No suspicious bone lesions identified.
IMPRESSION: No acute findings within the abdomen or pelvis.

Hepatic cirrhosis and findings of portal venous hypertension. No
evidence of hepatic neoplasm.

Cholelithiasis. No radiographic evidence of cholecystitis.

Stable moderately enlarged prostate and findings of chronic bladder
outlet obstruction.

Stable small left inguinal hernia, and moderate right inguinal
hernia containing fat and fluid.

Decreased size of small umbilical hernia, which contains only fat.

## 2022-05-25 ENCOUNTER — Other Ambulatory Visit: Payer: Self-pay

## 2022-05-28 ENCOUNTER — Other Ambulatory Visit: Payer: Self-pay

## 2022-06-14 ENCOUNTER — Ambulatory Visit: Payer: Medicare Other | Attending: Critical Care Medicine | Admitting: Critical Care Medicine

## 2022-06-14 ENCOUNTER — Other Ambulatory Visit: Payer: Self-pay

## 2022-06-14 ENCOUNTER — Encounter: Payer: Self-pay | Admitting: Critical Care Medicine

## 2022-06-14 VITALS — BP 137/80 | HR 69 | Ht 70.0 in | Wt 224.4 lb

## 2022-06-14 DIAGNOSIS — D696 Thrombocytopenia, unspecified: Secondary | ICD-10-CM

## 2022-06-14 DIAGNOSIS — F1010 Alcohol abuse, uncomplicated: Secondary | ICD-10-CM

## 2022-06-14 DIAGNOSIS — Z79899 Other long term (current) drug therapy: Secondary | ICD-10-CM | POA: Insufficient documentation

## 2022-06-14 DIAGNOSIS — B182 Chronic viral hepatitis C: Secondary | ICD-10-CM | POA: Diagnosis not present

## 2022-06-14 DIAGNOSIS — I10 Essential (primary) hypertension: Secondary | ICD-10-CM | POA: Insufficient documentation

## 2022-06-14 DIAGNOSIS — K7031 Alcoholic cirrhosis of liver with ascites: Secondary | ICD-10-CM | POA: Diagnosis not present

## 2022-06-14 DIAGNOSIS — R188 Other ascites: Secondary | ICD-10-CM

## 2022-06-14 DIAGNOSIS — I700 Atherosclerosis of aorta: Secondary | ICD-10-CM | POA: Diagnosis not present

## 2022-06-14 DIAGNOSIS — K766 Portal hypertension: Secondary | ICD-10-CM | POA: Insufficient documentation

## 2022-06-14 DIAGNOSIS — K2970 Gastritis, unspecified, without bleeding: Secondary | ICD-10-CM

## 2022-06-14 DIAGNOSIS — K7030 Alcoholic cirrhosis of liver without ascites: Secondary | ICD-10-CM

## 2022-06-14 MED ORDER — FUROSEMIDE 20 MG PO TABS
20.0000 mg | ORAL_TABLET | Freq: Every day | ORAL | 3 refills | Status: DC | PRN
Start: 2022-06-14 — End: 2023-04-18
  Filled 2022-06-14: qty 30, 30d supply, fill #0

## 2022-06-14 MED ORDER — SPIRONOLACTONE 25 MG PO TABS
25.0000 mg | ORAL_TABLET | Freq: Every day | ORAL | 1 refills | Status: DC
  Filled 2022-06-14 – 2022-08-16 (×2): qty 90, 90d supply, fill #0
  Filled 2022-12-13: qty 90, 90d supply, fill #1

## 2022-06-14 MED ORDER — ATORVASTATIN CALCIUM 20 MG PO TABS
20.0000 mg | ORAL_TABLET | Freq: Every day | ORAL | 3 refills | Status: DC
  Filled 2022-06-14 – 2022-07-20 (×3): qty 90, 90d supply, fill #0
  Filled 2022-11-08: qty 90, 90d supply, fill #1
  Filled 2023-03-18: qty 90, 90d supply, fill #2

## 2022-06-14 MED ORDER — AMLODIPINE BESYLATE 5 MG PO TABS
5.0000 mg | ORAL_TABLET | Freq: Every day | ORAL | 2 refills | Status: DC
  Filled 2022-06-14 – 2022-07-20 (×3): qty 90, 90d supply, fill #0
  Filled 2022-11-08: qty 90, 90d supply, fill #1
  Filled 2023-03-18: qty 90, 90d supply, fill #2

## 2022-06-14 MED ORDER — ZOSTER VAC RECOMB ADJUVANTED 50 MCG/0.5ML IM SUSR
0.5000 mL | Freq: Once | INTRAMUSCULAR | 0 refills | Status: AC
Start: 2022-06-14 — End: 2022-06-15
  Filled 2022-06-14: qty 0.5, 1d supply, fill #0

## 2022-06-14 MED ORDER — NADOLOL 80 MG PO TABS
80.0000 mg | ORAL_TABLET | Freq: Every day | ORAL | 3 refills | Status: DC
  Filled 2022-06-14 – 2022-07-04 (×2): qty 60, 60d supply, fill #0
  Filled 2022-09-11: qty 30, 30d supply, fill #1
  Filled 2022-10-09: qty 60, 60d supply, fill #2
  Filled 2023-01-08: qty 60, 60d supply, fill #3
  Filled 2023-03-18: qty 30, 30d supply, fill #4

## 2022-06-14 MED ORDER — OMEPRAZOLE 40 MG PO CPDR
40.0000 mg | DELAYED_RELEASE_CAPSULE | Freq: Every day | ORAL | 2 refills | Status: DC
  Filled 2022-08-16: qty 60, 60d supply, fill #0
  Filled 2022-11-08: qty 60, 60d supply, fill #1
  Filled 2023-01-22: qty 60, 60d supply, fill #2

## 2022-06-14 NOTE — Progress Notes (Signed)
No concerns. 

## 2022-06-14 NOTE — Assessment & Plan Note (Signed)
Increase diuresis

## 2022-06-14 NOTE — Assessment & Plan Note (Signed)
reassess

## 2022-06-14 NOTE — Patient Instructions (Signed)
Take your lasix fluid pill daily for 3 days then one every 3 days for one week and as needed for swelling All medication refilled Shingles last vaccine given Return dr Delford Field 6 months

## 2022-06-14 NOTE — Progress Notes (Signed)
Patient: Richard Yang, Male    DOB: 1955/07/04, 68 y.o.   MRN: [id number]  Subjective  Chief Complaint  Patient presents with   Hypertension      11/29 This is a Medicare wellness visit and follow-up for this patient who has history of hypertension aortic atherosclerosis portal hypertension cirrhotic liver disease with chronic hepatitis C and alcohol use.  Patient longer is using alcohol at this time.  He has history of esophageal varices in the past but no recent bleeding.  He had an inguinal hernia repair and had some mild tenderness in the umbilical area.  He needs follow-up labs and refills on medications.  He had not been taking his Aldactone and this needs to be resumed.  Blood pressure on arrival is good 123/75.  The patient's already received his flu vaccine in October  06/14/22 Patient seen and examined in follow-up from November visit.  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  He has been on multiple medications to control portal hypertension and edema in abdomen and scrotum.  Patient arrives in good spirits with no acute distress.  He needs follow-up labs at this visit.  He also wishes to receive his second and final shingles vaccine. He still drinking about 1 glass of wine weekly      Patient Active Problem List   Diagnosis Date Noted   Testicular swelling 08/29/2021   Seasonal allergic rhinitis 08/29/2021   Encounter for health-related screening 08/29/2021   Hx of esophageal varices 10/03/2020   Hepatic cirrhosis (HCC) 10/03/2020   Portal hypertensive gastropathy (HCC) 10/03/2020   Other cirrhosis of liver (HCC) 11/16/2019   Ascites of liver 11/16/2019   Chronic hepatitis C without hepatic coma (HCC) 09/23/2019   Portal hypertension (HCC) 08/06/2019   Thrombocytopenia (HCC) 08/06/2019   Inguinal hernia 07/13/2019   Aortic atherosclerosis (HCC) 07/13/2019   Alcohol abuse 07/13/2019   Liver cirrhosis, alcoholic (HCC)  06/24/2019   Hypertension 06/24/2019   Past Medical History:  Diagnosis Date   Alcohol use disorder, moderate, in early remission, dependence (HCC) 11/16/2019   Ascites    Cirrhosis (HCC)    Elevated AFP    Hepatitis C    Incarcerated umbilical hernia 03/29/2020   Inguinal hernia    Portal hypertension (HCC)    Substance abuse (HCC)    alcohol abuse hx   Umbilical hernia    Past Surgical History:  Procedure Laterality Date   INGUINAL HERNIA REPAIR N/A 03/29/2020   Procedure: OPEN UMBILICAL HERNIA REPAIR, BOWEL RESECTION;  Surgeon: Quentin Ore, MD;  Location: [facility];  Service: General;  Laterality: N/A;   IR PARACENTESIS  09/16/2019   IR PARACENTESIS  10/09/2019   IR PARACENTESIS  12/09/2019   Social History   Tobacco Use   Smoking status: Never   Smokeless tobacco: Never  Vaping Use   Vaping Use: Never used  Substance Use Topics   Alcohol use: Yes    Alcohol/week: 2.0 standard drinks of alcohol    Types: 2 Glasses of wine per week    Comment: occ   Drug use: Not Currently   Social History   Socioeconomic History   Marital status: Single    Spouse name: Not on file   Number of children: Not on file   Years of education: Not on file   Highest education level: Not on file  Occupational History   Not on file  Tobacco Use   Smoking status: Never   Smokeless tobacco:  Never  Vaping Use   Vaping Use: Never used  Substance and Sexual Activity   Alcohol use: Yes    Alcohol/week: 2.0 standard drinks of alcohol    Types: 2 Glasses of wine per week    Comment: occ   Drug use: Not Currently   Sexual activity: Not Currently    Partners: Male  Other Topics Concern   Not on file  Social History Narrative   Not on file   Social Determinants of Health   Financial Resource Strain: Not on file  Food Insecurity: Not on file  Transportation Needs: Not on file  Physical Activity: Not on file  Stress: Not on file  Social Connections: Not on file  Intimate Partner  Violence: Not on file   Family Status  Relation Name Status   Neg Hx  (Not Specified)   Family History  Problem Relation Age of Onset   Stomach cancer Neg Hx    Colon cancer Neg Hx    Esophageal cancer Neg Hx    Pancreatic cancer Neg Hx    Colon polyps Neg Hx    Rectal cancer Neg Hx    No Known Allergies    Medications: Outpatient Medications Prior to Visit  Medication Sig Note   [DISCONTINUED] amLODipine (NORVASC) 5 MG tablet Take 1 tablet (5 mg total) by mouth daily.    [DISCONTINUED] atorvastatin (LIPITOR) 20 MG tablet Take 1 tablet (20 mg total) by mouth daily.    [DISCONTINUED] furosemide (LASIX) 20 MG tablet Take 1 tablet (20 mg total) by mouth daily as needed for fluid or edema (scrotal swelling). 06/14/2022: prn   [DISCONTINUED] nadolol (CORGARD) 80 MG tablet Take 1 tablet (80 mg total) by mouth daily.    [DISCONTINUED] omeprazole (PRILOSEC) 40 MG capsule Take 1 capsule (40 mg total) by mouth daily.    [DISCONTINUED] spironolactone (ALDACTONE) 25 MG tablet Take 1 tablet (25 mg total) by mouth daily.    No facility-administered medications prior to visit.    No Known Allergies  Patient Care Team: Storm Frisk, MD as PCP - General (Pulmonary Disease)  Review of Systems  Constitutional:  Negative for chills, diaphoresis, fever, malaise/fatigue and weight loss.  HENT:  Negative for congestion, hearing loss, nosebleeds, sore throat and tinnitus.   Eyes:  Negative for blurred vision, photophobia and redness.  Respiratory:  Negative for cough, hemoptysis, sputum production, shortness of breath, wheezing and stridor.   Cardiovascular:  Negative for chest pain, palpitations, orthopnea, claudication, leg swelling and PND.  Gastrointestinal:  Negative for abdominal pain, blood in stool, constipation, diarrhea, heartburn, nausea and vomiting.  Genitourinary:  Negative for dysuria, flank pain, frequency, hematuria and urgency.  Musculoskeletal:  Negative for back pain, falls,  joint pain, myalgias and neck pain.  Skin:  Negative for itching and rash.  Neurological:  Negative for dizziness, tingling, tremors, sensory change, speech change, focal weakness, seizures, loss of consciousness, weakness and headaches.  Endo/Heme/Allergies:  Negative for environmental allergies and polydipsia. Does not bruise/bleed easily.  Psychiatric/Behavioral:  Negative for depression, memory loss, substance abuse and suicidal ideas. The patient is not nervous/anxious and does not have insomnia.         Objective  BP 137/80 (BP Location: Left Arm, Patient Position: Sitting, Cuff Size: Large)   Pulse 69   Ht 5\' 10"  (1.778 m)   Wt 224 lb 6.4 oz (101.8 kg)   SpO2 97%   BMI 32.20 kg/m  BP Readings from Last 3 Encounters:  06/14/22 137/80  01/03/22 123/75  08/29/21 129/79   Wt Readings from Last 3 Encounters:  06/14/22 224 lb 6.4 oz (101.8 kg)  01/03/22 228 lb 12.8 oz (103.8 kg)  08/29/21 221 lb 9.6 oz (100.5 kg)      Physical Exam Vitals reviewed.  Constitutional:      Appearance: Normal appearance. He is well-developed. He is not diaphoretic.  HENT:     Head: Normocephalic and atraumatic.     Nose: Nose normal. No nasal deformity, septal deviation, mucosal edema or rhinorrhea.     Right Sinus: No maxillary sinus tenderness or frontal sinus tenderness.     Left Sinus: No maxillary sinus tenderness or frontal sinus tenderness.     Mouth/Throat:     Mouth: Mucous membranes are moist.     Pharynx: Oropharynx is clear. No oropharyngeal exudate.  Eyes:     General: No scleral icterus.    Conjunctiva/sclera: Conjunctivae normal.     Pupils: Pupils are equal, round, and reactive to light.  Neck:     Thyroid: No thyromegaly.     Vascular: No carotid bruit or JVD.     Trachea: Trachea normal. No tracheal tenderness or tracheal deviation.  Cardiovascular:     Rate and Rhythm: Normal rate and regular rhythm.     Chest Wall: PMI is not displaced.     Pulses: Normal pulses.  No decreased pulses.     Heart sounds: Normal heart sounds, S1 normal and S2 normal. Heart sounds not distant. No murmur heard.    No systolic murmur is present.     No diastolic murmur is present.     No friction rub. No gallop. No S3 or S4 sounds.  Pulmonary:     Effort: Pulmonary effort is normal. No tachypnea, accessory muscle usage or respiratory distress.     Breath sounds: No stridor. No decreased breath sounds, wheezing, rhonchi or rales.  Chest:     Chest wall: No tenderness.  Abdominal:     General: Bowel sounds are normal. There is distension.     Palpations: Abdomen is soft. Abdomen is not rigid. There is no mass.     Tenderness: There is no abdominal tenderness. There is no right CVA tenderness, left CVA tenderness, guarding or rebound.     Hernia: No hernia is present.     Comments: Very minimal fluid wave mild ascites  Musculoskeletal:        General: Normal range of motion.     Cervical back: Normal range of motion and neck supple. No edema, erythema or rigidity. No muscular tenderness. Normal range of motion.  Lymphadenopathy:     Head:     Right side of head: No submental or submandibular adenopathy.     Left side of head: No submental or submandibular adenopathy.     Cervical: No cervical adenopathy.  Skin:    General: Skin is warm and dry.     Coloration: Skin is not pale.     Findings: No rash.     Nails: There is no clubbing.  Neurological:     General: No focal deficit present.     Mental Status: He is alert and oriented to person, place, and time. Mental status is at baseline.     Sensory: No sensory deficit.  Psychiatric:        Mood and Affect: Mood normal.        Speech: Speech normal.        Behavior: Behavior normal.  Thought Content: Thought content normal.        Judgment: Judgment normal.       Most recent functional status assessment:     No data to display         Most recent fall risk assessment:    06/14/2022    9:46 AM   Fall Risk   Falls in the past year? 0  Number falls in past yr: 0  Injury with Fall? 0  Risk for fall due to : No Fall Risks  Follow up Falls evaluation completed    Most recent depression screenings:    06/14/2022    9:46 AM 01/03/2022   10:06 AM  PHQ 2/9 Scores  PHQ - 2 Score 0 0  PHQ- 9 Score 0    Most recent cognitive screening:     No data to display        l  Last CBC Lab Results  Component Value Date   WBC 5.3 06/29/2021   HGB 13.8 06/29/2021   HCT 40.8 06/29/2021   MCV 92 06/29/2021   MCH 31.1 06/29/2021   RDW 13.7 06/29/2021   PLT 121 (L) 06/29/2021   Last metabolic panel Lab Results  Component Value Date   GLUCOSE 101 (H) 01/03/2022   NA 138 01/03/2022   K 4.1 01/03/2022   CL 101 01/03/2022   CO2 24 01/03/2022   BUN 19 01/03/2022   CREATININE 1.29 (H) 01/03/2022   EGFR 61 01/03/2022   CALCIUM 9.6 01/03/2022   PROT 7.4 01/03/2022   ALBUMIN 4.6 01/03/2022   LABGLOB 2.8 01/03/2022   AGRATIO 1.6 01/03/2022   BILITOT 0.7 01/03/2022   ALKPHOS 88 01/03/2022   AST 27 01/03/2022   ALT 25 01/03/2022   ANIONGAP 8 04/01/2020   Last lipids Lab Results  Component Value Date   CHOL 160 06/29/2021   HDL 87 06/29/2021   LDLCALC 57 06/29/2021   TRIG 89 06/29/2021   CHOLHDL 1.8 06/29/2021   Last thyroid functions Lab Results  Component Value Date   TSH 2.820 08/06/2019   T4TOTAL 6.5 08/06/2019   Last vitamin D No results found for: "25OHVITD2", "25OHVITD3", "VD25OH" Last vitamin B12 and Folate No results found for: "VITAMINB12", "FOLATE"  No results found for any visits on 06/14/22.    Assessment & Plan   Annual wellness visit done today including the all of the following: Reviewed patient's Family Medical History Reviewed and updated list of patient's medical providers Assessment of cognitive impairment was done Assessed patient's functional ability Established a written schedule for health screening services Health Risk Assessent  Completed and Reviewed  Exercise Activities and Dietary recommendations  Goals   None    Immunization History  Administered Date(s) Administered   Hep A / Hep B 10/20/2020   Hepatitis A, Adult 11/04/2019   Hepatitis B, ADULT 09/17/2019, 11/04/2019, 12/04/2019   Hepb-cpg 11/21/2020   Influenza,inj,Quad PF,6+ Mos 01/05/2020, 10/19/2020, 11/14/2021   Moderna Sars-Covid-2 Vaccination 07/13/2019, 08/10/2019   PNEUMOCOCCAL CONJUGATE-20 06/29/2021   Pneumococcal Conjugate-13 01/28/2020   Tdap 02/11/2020   Zoster Recombinat (Shingrix) 08/29/2021    Health Maintenance  Topic Date Due   COVID-19 Vaccine (3 - 2023-24 season) 10/06/2021   Zoster Vaccines- Shingrix (2 of 2) 10/24/2021   INFLUENZA VACCINE  09/06/2022   Medicare Annual Wellness (AWV)  01/04/2023   DTaP/Tdap/Td (2 - Td or Tdap) 02/10/2030   COLONOSCOPY (Pts 45-19yrs Insurance coverage will need to be confirmed)  08/11/2030   Pneumonia Vaccine 65+ Years  old  Completed   Hepatitis C Screening  Completed   HPV VACCINES  Aged Out     Discussed health benefits of physical activity, and encouraged him to engage in regular exercise appropriate for his age and condition.    Problem List Items Addressed This Visit       Cardiovascular and Mediastinum   Hypertension - Primary    Hypertension well-controlled no changes      Relevant Medications   amLODipine (NORVASC) 5 MG tablet   atorvastatin (LIPITOR) 20 MG tablet   furosemide (LASIX) 20 MG tablet   nadolol (CORGARD) 80 MG tablet   spironolactone (ALDACTONE) 25 MG tablet   Aortic atherosclerosis (HCC)    monitor and continue statin      Relevant Medications   amLODipine (NORVASC) 5 MG tablet   atorvastatin (LIPITOR) 20 MG tablet   furosemide (LASIX) 20 MG tablet   nadolol (CORGARD) 80 MG tablet   spironolactone (ALDACTONE) 25 MG tablet   Other Relevant Orders   Lipid panel   Portal hypertension (HCC)    Stable at this time      Relevant Medications    amLODipine (NORVASC) 5 MG tablet   atorvastatin (LIPITOR) 20 MG tablet   furosemide (LASIX) 20 MG tablet   nadolol (CORGARD) 80 MG tablet   spironolactone (ALDACTONE) 25 MG tablet   Other Relevant Orders   CBC with Differential/Platelet   Basic metabolic panel     Digestive   Liver cirrhosis, alcoholic (HCC)    Advised to reduce alcohol intake      Relevant Medications   nadolol (CORGARD) 80 MG tablet   omeprazole (PRILOSEC) 40 MG capsule   Other Relevant Orders   CBC with Differential/Platelet   Basic metabolic panel   Ascites of liver    Increase diuresis      Hepatic cirrhosis (HCC)   Relevant Medications   nadolol (CORGARD) 80 MG tablet   omeprazole (PRILOSEC) 40 MG capsule   Other Relevant Orders   CBC with Differential/Platelet   Basic metabolic panel     Hematopoietic and Hemostatic   Thrombocytopenia (HCC)    reassess        Other   Alcohol abuse    Advised to reduce alcohol to 0      Other Visit Diagnoses     Gastritis, presence of bleeding unspecified, unspecified chronicity, unspecified gastritis type       Relevant Medications   omeprazole (PRILOSEC) 40 MG capsule   Other Relevant Orders   CBC with Differential/Platelet       Return in about 6 months (around 12/15/2022) for htn, alcohol cirrhosis.     Shan Levans, MD

## 2022-06-14 NOTE — Assessment & Plan Note (Addendum)
monitor and continue statin

## 2022-06-14 NOTE — Assessment & Plan Note (Signed)
Advised to reduce alcohol intake. 

## 2022-06-14 NOTE — Assessment & Plan Note (Signed)
Hypertension well-controlled no changes 

## 2022-06-14 NOTE — Assessment & Plan Note (Signed)
Stable at this time 

## 2022-06-14 NOTE — Assessment & Plan Note (Signed)
Advised to reduce alcohol to 0

## 2022-06-15 LAB — CBC WITH DIFFERENTIAL/PLATELET
Basophils Absolute: 0 10*3/uL (ref 0.0–0.2)
Basos: 0 %
EOS (ABSOLUTE): 0.1 10*3/uL (ref 0.0–0.4)
Eos: 2 %
Hematocrit: 42.5 % (ref 37.5–51.0)
Hemoglobin: 14 g/dL (ref 13.0–17.7)
Immature Grans (Abs): 0 10*3/uL (ref 0.0–0.1)
Immature Granulocytes: 0 %
Lymphocytes Absolute: 1.9 10*3/uL (ref 0.7–3.1)
Lymphs: 30 %
MCH: 29.8 pg (ref 26.6–33.0)
MCHC: 32.9 g/dL (ref 31.5–35.7)
MCV: 90 fL (ref 79–97)
Monocytes Absolute: 0.6 10*3/uL (ref 0.1–0.9)
Monocytes: 9 %
Neutrophils Absolute: 3.6 10*3/uL (ref 1.4–7.0)
Neutrophils: 59 %
Platelets: 156 10*3/uL (ref 150–450)
RBC: 4.7 x10E6/uL (ref 4.14–5.80)
RDW: 13.4 % (ref 11.6–15.4)
WBC: 6.2 10*3/uL (ref 3.4–10.8)

## 2022-06-15 LAB — BASIC METABOLIC PANEL
BUN/Creatinine Ratio: 12 (ref 10–24)
BUN: 13 mg/dL (ref 8–27)
CO2: 20 mmol/L (ref 20–29)
Calcium: 9.2 mg/dL (ref 8.6–10.2)
Chloride: 105 mmol/L (ref 96–106)
Creatinine, Ser: 1.08 mg/dL (ref 0.76–1.27)
Glucose: 100 mg/dL — ABNORMAL HIGH (ref 70–99)
Potassium: 5 mmol/L (ref 3.5–5.2)
Sodium: 142 mmol/L (ref 134–144)
eGFR: 75 mL/min/{1.73_m2} (ref >59)

## 2022-06-15 LAB — LIPID PANEL
Chol/HDL Ratio: 1.8 ratio (ref 0.0–5.0)
Cholesterol, Total: 146 mg/dL (ref 100–199)
HDL: 79 mg/dL (ref >39)
LDL Chol Calc (NIH): 54 mg/dL (ref 0–99)
Triglycerides: 66 mg/dL (ref 0–149)
VLDL Cholesterol Cal: 13 mg/dL (ref 5–40)

## 2022-06-21 ENCOUNTER — Other Ambulatory Visit: Payer: Self-pay

## 2022-07-04 ENCOUNTER — Other Ambulatory Visit: Payer: Self-pay

## 2022-07-05 ENCOUNTER — Other Ambulatory Visit (HOSPITAL_COMMUNITY): Payer: Self-pay

## 2022-07-05 ENCOUNTER — Other Ambulatory Visit: Payer: Self-pay

## 2022-07-20 ENCOUNTER — Other Ambulatory Visit: Payer: Self-pay

## 2022-08-16 ENCOUNTER — Other Ambulatory Visit: Payer: Self-pay

## 2022-09-11 ENCOUNTER — Other Ambulatory Visit: Payer: Self-pay

## 2022-10-09 ENCOUNTER — Other Ambulatory Visit: Payer: Self-pay

## 2022-11-08 ENCOUNTER — Other Ambulatory Visit: Payer: Self-pay

## 2022-12-13 ENCOUNTER — Other Ambulatory Visit: Payer: Self-pay

## 2023-01-08 ENCOUNTER — Other Ambulatory Visit: Payer: Self-pay

## 2023-01-08 ENCOUNTER — Ambulatory Visit: Payer: 59 | Attending: Physician Assistant

## 2023-01-08 NOTE — Progress Notes (Signed)
This encounter was created in error - please disregard.

## 2023-01-22 ENCOUNTER — Other Ambulatory Visit: Payer: Self-pay

## 2023-03-18 ENCOUNTER — Other Ambulatory Visit: Payer: Self-pay

## 2023-03-19 ENCOUNTER — Other Ambulatory Visit: Payer: Self-pay

## 2023-04-18 ENCOUNTER — Other Ambulatory Visit: Payer: Self-pay

## 2023-04-18 ENCOUNTER — Ambulatory Visit: Attending: Physician Assistant | Admitting: Physician Assistant

## 2023-04-18 ENCOUNTER — Encounter: Payer: Self-pay | Admitting: Physician Assistant

## 2023-04-18 ENCOUNTER — Other Ambulatory Visit: Payer: Self-pay | Admitting: Critical Care Medicine

## 2023-04-18 VITALS — BP 132/81 | HR 54 | Ht 70.0 in | Wt 220.8 lb

## 2023-04-18 DIAGNOSIS — N5089 Other specified disorders of the male genital organs: Secondary | ICD-10-CM

## 2023-04-18 DIAGNOSIS — K7031 Alcoholic cirrhosis of liver with ascites: Secondary | ICD-10-CM

## 2023-04-18 DIAGNOSIS — R739 Hyperglycemia, unspecified: Secondary | ICD-10-CM | POA: Diagnosis not present

## 2023-04-18 DIAGNOSIS — K2970 Gastritis, unspecified, without bleeding: Secondary | ICD-10-CM

## 2023-04-18 DIAGNOSIS — K7030 Alcoholic cirrhosis of liver without ascites: Secondary | ICD-10-CM

## 2023-04-18 DIAGNOSIS — I10 Essential (primary) hypertension: Secondary | ICD-10-CM | POA: Diagnosis not present

## 2023-04-18 DIAGNOSIS — J302 Other seasonal allergic rhinitis: Secondary | ICD-10-CM | POA: Diagnosis not present

## 2023-04-18 MED ORDER — CETIRIZINE HCL 10 MG PO TABS
10.0000 mg | ORAL_TABLET | Freq: Every day | ORAL | 11 refills | Status: DC
  Filled 2023-04-18: qty 30, 30d supply, fill #0
  Filled 2023-07-08: qty 30, 30d supply, fill #1

## 2023-04-18 MED ORDER — FLUTICASONE PROPIONATE 50 MCG/ACT NA SUSP
2.0000 | Freq: Every day | NASAL | 6 refills | Status: DC
  Filled 2023-04-18: qty 16, 30d supply, fill #0

## 2023-04-18 MED ORDER — NADOLOL 80 MG PO TABS
80.0000 mg | ORAL_TABLET | Freq: Every day | ORAL | 3 refills | Status: DC
Start: 2023-04-18 — End: 2023-06-11
  Filled 2023-04-18: qty 30, 30d supply, fill #0

## 2023-04-18 MED ORDER — FUROSEMIDE 20 MG PO TABS
20.0000 mg | ORAL_TABLET | Freq: Every day | ORAL | 3 refills | Status: AC | PRN
Start: 2023-04-18 — End: ?
  Filled 2023-04-18: qty 30, 30d supply, fill #0

## 2023-04-18 MED ORDER — AMLODIPINE BESYLATE 5 MG PO TABS
5.0000 mg | ORAL_TABLET | Freq: Every day | ORAL | 2 refills | Status: AC
  Filled 2023-04-18 – 2023-07-08 (×2): qty 90, 90d supply, fill #0
  Filled 2023-11-08: qty 90, 90d supply, fill #1
  Filled 2024-02-14: qty 90, 90d supply, fill #2

## 2023-04-18 MED ORDER — SPIRONOLACTONE 25 MG PO TABS
25.0000 mg | ORAL_TABLET | Freq: Every day | ORAL | 1 refills | Status: DC
Start: 2023-04-18 — End: 2023-12-05
  Filled 2023-04-18: qty 90, 90d supply, fill #0
  Filled 2023-09-03: qty 90, 90d supply, fill #1

## 2023-04-18 MED ORDER — OMEPRAZOLE 40 MG PO CPDR
40.0000 mg | DELAYED_RELEASE_CAPSULE | Freq: Every day | ORAL | 2 refills | Status: DC
Start: 2023-04-18 — End: 2023-07-08
  Filled 2023-04-18: qty 60, 60d supply, fill #0
  Filled 2023-07-08: qty 30, 30d supply, fill #1

## 2023-04-18 NOTE — Progress Notes (Signed)
 Patient ID: Richard Yang, male   DOB: 1955-12-01, 68 y.o.   MRN: [id number]   Kourtland Coopman, is a 68 y.o. male  [id number]:[id number]  [id number]:[id number]  DOB - 09-11-1955  Chief Complaint  Patient presents with   Medication Refill    Needs letter for apartment       Subjective:   Richard Yang is a 68 y.o. male here today for med RF.  He only takes lasix prn.  Other meds daily.  Denies CP/SOB/muscle cramping.    He is c/o family new-ish onset of allergies.  He lives in senior apartments and feels certain he has mold or something in his carpet that is making him have allergies.  He c/o sneezing, post nasal drip, and occasional cough that started developing over the last year or so.  He has lived in his current place for about 8 years now.  He wants me to write a letter stating he requires wood flooring.  He has never had formal allergy testing.  He has never had long tem allergy medications.  He has tried Photographer and claritin without success.    No abdominal pain.  Not drinking as much alcohol   No problems updated.  ALLERGIES: No Known Allergies  PAST MEDICAL HISTORY: Past Medical History:  Diagnosis Date   Alcohol use disorder, moderate, in early remission, dependence (HCC) 11/16/2019   Ascites    Cirrhosis (HCC)    Elevated AFP    Hepatitis C    Incarcerated umbilical hernia 03/29/2020   Inguinal hernia    Portal hypertension (HCC)    Substance abuse (HCC)    alcohol abuse hx   Umbilical hernia     MEDICATIONS AT HOME: Prior to Admission medications   Medication Sig Start Date End Date Taking? Authorizing Provider  atorvastatin (LIPITOR) 20 MG tablet Take 1 tablet (20 mg total) by mouth daily. 06/14/22  Yes Storm Frisk, MD  cetirizine (ZYRTEC) 10 MG tablet Take 1 tablet (10 mg total) by mouth daily. 04/18/23  Yes Liliann File M, PA-C  fluticasone (FLONASE) 50 MCG/ACT nasal spray Place 2 sprays into both nostrils daily. 04/18/23  Yes Georgian Co M, PA-C  amLODipine  (NORVASC) 5 MG tablet Take 1 tablet (5 mg total) by mouth daily. 04/18/23   Anders Simmonds, PA-C  furosemide (LASIX) 20 MG tablet Take 1 tablet (20 mg total) by mouth daily as needed for fluid or edema (scrotal swelling). 04/18/23   Anders Simmonds, PA-C  nadolol (CORGARD) 80 MG tablet Take 1 tablet (80 mg total) by mouth daily. 04/18/23   Anders Simmonds, PA-C  omeprazole (PRILOSEC) 40 MG capsule Take 1 capsule (40 mg total) by mouth daily. 04/18/23   Anders Simmonds, PA-C  spironolactone (ALDACTONE) 25 MG tablet Take 1 tablet (25 mg total) by mouth daily. 04/18/23   Kalev Temme, Marzella Schlein, PA-C    ROS: Neg resp Neg cardiac Neg GI Neg GU Neg MS Neg psych Neg neuro  Objective:   Vitals:   04/18/23 1437 04/18/23 1501  BP: (!) 162/76 132/81  Pulse: (!) 54   SpO2: 97%   Weight: 220 lb 12.8 oz (100.2 kg)   Height: 5\' 10"  (1.778 m)    Exam General appearance : Awake, alert, not in any distress. Speech Clear. Not toxic looking HEENT: Atraumatic and Normocephalic, B turbinates with swelling and bluish color.  +PND.   Neck: Supple, no JVD. No cervical lymphadenopathy.  Chest: Good air entry bilaterally, CTAB.  No  rales/rhonchi/wheezing CVS: S1 S2 regular, no murmurs.  Extremities: B/L Lower Ext shows no edema, both legs are warm to touch Neurology: Awake alert, and oriented X 3, CN II-XII intact, Non focal Skin: No Rash  Data Review Lab Results  Component Value Date   HGBA1C 5.5 06/29/2021   HGBA1C 5.5 12/16/2015   HGBA1C 5.60 03/15/2015    Assessment & Plan   1. Alcoholic cirrhosis, unspecified whether ascites present (HCC) Still working to cut back - nadolol (CORGARD) 80 MG tablet; Take 1 tablet (80 mg total) by mouth daily.  Dispense: 60 tablet; Refill: 3 - omeprazole (PRILOSEC) 40 MG capsule; Take 1 capsule (40 mg total) by mouth daily.  Dispense: 60 capsule; Refill: 2  2. Gastritis, presence of bleeding unspecified, unspecified chronicity, unspecified gastritis type -  omeprazole (PRILOSEC) 40 MG capsule; Take 1 capsule (40 mg total) by mouth daily.  Dispense: 60 capsule; Refill: 2  3. Primary hypertension (Primary) controlled - amLODipine (NORVASC) 5 MG tablet; Take 1 tablet (5 mg total) by mouth daily.  Dispense: 90 tablet; Refill: 2 - Comprehensive metabolic panel  4. Alcoholic cirrhosis of liver with ascites (HCC) Charlotta Newton.org is the website for narcotics anonymous  [employer]com.cy (website) or 541-856-8132 is the information for alcoholics anonymous  Both are free and immediately available for help with alcohol and drug use  - spironolactone (ALDACTONE) 25 MG tablet; Take 1 tablet (25 mg total) by mouth daily.  Dispense: 90 tablet; Refill: 1  5. Testicular swelling Intermittent/long standing - furosemide (LASIX) 20 MG tablet; Take 1 tablet (20 mg total) by mouth daily as needed for fluid or edema (scrotal swelling).  Dispense: 30 tablet; Refill: 3  6. Hyperglycemia - Comprehensive metabolic panel  7. Seasonal allergic rhinitis, unspecified trigger - Ambulatory referral to Allergy - fluticasone (FLONASE) 50 MCG/ACT nasal spray; Place 2 sprays into both nostrils daily.  Dispense: 16 g; Refill: 6 - cetirizine (ZYRTEC) 10 MG tablet; Take 1 tablet (10 mg total) by mouth daily.  Dispense: 30 tablet; Refill: 11    Return in about 4 months (around 08/18/2023) for please assign to one of our PCP.  The patient was given clear instructions to go to ER or return to medical center if symptoms don't improve, worsen or new problems develop. The patient verbalized understanding. The patient was told to call to get lab results if they haven't heard anything in the next week.      Georgian Co, PA-C [facility] [place], [place] [phone]   04/18/2023, 3:02 PM

## 2023-04-19 ENCOUNTER — Encounter: Payer: Self-pay | Admitting: Physician Assistant

## 2023-04-19 LAB — COMPREHENSIVE METABOLIC PANEL
ALT: 23 IU/L (ref 0–44)
AST: 23 IU/L (ref 0–40)
Albumin: 4.4 g/dL (ref 3.9–4.9)
Alkaline Phosphatase: 74 IU/L (ref 44–121)
BUN/Creatinine Ratio: 15 (ref 10–24)
BUN: 18 mg/dL (ref 8–27)
Bilirubin Total: 0.4 mg/dL (ref 0.0–1.2)
CO2: 20 mmol/L (ref 20–29)
Calcium: 9.3 mg/dL (ref 8.6–10.2)
Chloride: 107 mmol/L — ABNORMAL HIGH (ref 96–106)
Creatinine, Ser: 1.22 mg/dL (ref 0.76–1.27)
Globulin, Total: 2.9 g/dL (ref 1.5–4.5)
Glucose: 92 mg/dL (ref 70–99)
Potassium: 4.6 mmol/L (ref 3.5–5.2)
Sodium: 142 mmol/L (ref 134–144)
Total Protein: 7.3 g/dL (ref 6.0–8.5)
eGFR: 65 mL/min/{1.73_m2} (ref >59)

## 2023-06-11 ENCOUNTER — Encounter: Payer: Self-pay | Admitting: Allergy & Immunology

## 2023-06-11 ENCOUNTER — Ambulatory Visit (INDEPENDENT_AMBULATORY_CARE_PROVIDER_SITE_OTHER): Admitting: Allergy & Immunology

## 2023-06-11 ENCOUNTER — Other Ambulatory Visit: Payer: Self-pay

## 2023-06-11 VITALS — BP 130/72 | HR 58 | Temp 98.0°F | Resp 18 | Ht 68.5 in | Wt 223.5 lb

## 2023-06-11 DIAGNOSIS — J310 Chronic rhinitis: Secondary | ICD-10-CM

## 2023-06-11 NOTE — Patient Instructions (Addendum)
 1. Chronic rhinitis - Because of insurance stipulations, we cannot do skin testing on the same day as your first visit. - We are all working to fight this, but for now we need to do two separate visits.  - We will know more after we do testing at the next visit.  - The skin testing visit can be squeezed in at your convenience.  - Then we can make a more full plan to address all of your symptoms. - Be sure to stop your antihistamines for 3 days before this appointment.  - We are going to start Singulair (montelukast) to see if that helps. - This CAN be taken during the testing, so no need to stop it before the testing. - This can rarely cause irritability, depression, and weird dreams, so beware. - Start Ryaltris one spray per nostril daily (this combines a steroid AND a a nasal antihistamine) - Pataday eye drop samples provided.   2. Return in about 1 week (around 06/18/2023) for SKIN TESTING (1-55). You can have the follow up appointment with Dr. Idolina Maker or a Nurse Practicioner (our Nurse Practitioners are excellent and always have Physician oversight!).    Please inform us  of any Emergency Department visits, hospitalizations, or changes in symptoms. Call us  before going to the ED for breathing or allergy symptoms since we might be able to fit you in for a sick visit. Feel free to contact us  anytime with any questions, problems, or concerns.  It was a pleasure to meet you today!  Websites that have reliable patient information: 1. American Academy of Asthma, Allergy, and Immunology: www.aaaai.org 2. Food Allergy Research and Education (FARE): foodallergy.org 3. Mothers of Asthmatics: http://www.asthmacommunitynetwork.org 4. American College of Allergy, Asthma, and Immunology: www.acaai.org      "Like" us  on Facebook and Instagram for our latest updates!      A healthy democracy works best when [employer] participate! Make sure you are registered to vote! If you have moved or  changed any of your contact information, you will need to get this updated before voting! Scan the QR codes below to learn more!

## 2023-06-11 NOTE — Progress Notes (Unsigned)
 Xc  NEW PATIENT  Date of Service/Encounter:  06/11/23  Consult requested by: Vernell Goldsmith, MD   Assessment:   Chronic rhinitis  Plan/Recommendations:   Patient Instructions  1. Chronic rhinitis - Because of insurance stipulations, we cannot do skin testing on the same day as your first visit. - We are all working to fight this, but for now we need to do two separate visits.  - We will know more after we do testing at the next visit.  - The skin testing visit can be squeezed in at your convenience.  - Then we can make a more full plan to address all of your symptoms. - Be sure to stop your antihistamines for 3 days before this appointment.  - We are going to start Singulair (montelukast) to see if that helps. - This CAN be taken during the testing, so no need to stop it before the testing. - This can rarely cause irritability, depression, and weird dreams, so beware. - Start Ryaltris one spray per nostril daily (this combines a steroid AND a a nasal antihistamine) - Pataday eye drop samples provided.   2. Return in about 1 week (around 06/18/2023) for SKIN TESTING (1-55). You can have the follow up appointment with Dr. Idolina Maker or a Nurse Practicioner (our Nurse Practitioners are excellent and always have Physician oversight!).    Please inform us  of any Emergency Department visits, hospitalizations, or changes in symptoms. Call us  before going to the ED for breathing or allergy symptoms since we might be able to fit you in for a sick visit. Feel free to contact us  anytime with any questions, problems, or concerns.  It was a pleasure to meet you today!  Websites that have  reliable patient information: 1. American Academy of Asthma, Allergy, and Immunology: www.aaaai.org 2. Food Allergy Research and Education (FARE): foodallergy.org 3. Mothers of Asthmatics: http://www.asthmacommunitynetwork.org 4. American College of Allergy, Asthma, and Immunology: www.acaai.org      "Like" us  on Facebook and Instagram for our latest updates!      A healthy democracy works best when [employer] participate! Make sure you are registered to vote! If you have moved or changed any of your contact information, you will need to get this updated before voting! Scan the QR codes below to learn more!                {Blank single:19197::"This note in its entirety was forwarded to the Provider who requested this consultation."}  Subjective:   Richard Yang is a 68 y.o. male presenting today for evaluation of  Chief Complaint  Patient presents with  . Allergies     Running nose , stopped up tried allergy medicines with no results    Richard Yang has a history of the following: Patient Active Problem List   Diagnosis Date Noted  . Testicular swelling 08/29/2021  . Seasonal allergic rhinitis 08/29/2021  . Encounter for health-related screening 08/29/2021  . Hx of esophageal varices 10/03/2020  . Hepatic cirrhosis (HCC) 10/03/2020  . Portal hypertensive gastropathy (HCC) 10/03/2020  . Other cirrhosis of liver (HCC) 11/16/2019  . Ascites of liver 11/16/2019  . Chronic hepatitis C without hepatic coma (HCC) 09/23/2019  . Portal hypertension (HCC) 08/06/2019  . Thrombocytopenia (HCC) 08/06/2019  . Inguinal hernia 07/13/2019  . Aortic atherosclerosis (HCC) 07/13/2019  . Alcohol abuse 07/13/2019  . Liver cirrhosis, alcoholic (HCC) 06/24/2019  . Hypertension 06/24/2019    History obtained from: chart review and {Persons; PED relatives w/patient:19415::"patient"}.  Discussed the use of AI scribe software for clinical note transcription with the  patient and/or guardian, who gave verbal consent to proceed.  Richard Yang was referred by Vernell Goldsmith, MD.     Richard Yang is a 67 y.o. male presenting for {Blank single:19197::"a food challenge","a drug challenge","skin testing","a sick visit","an evaluation of ***","a follow up visit"}.    Asthma/Respiratory Symptom History: ***  Allergic Rhinitis Symptom History: ***  Food Allergy Symptom History: ***  Skin Symptom History: ***  GERD Symptom History: ***  Infection Symptom History: ***  ***Otherwise, there is no history of other atopic diseases, including {Blank multiple:19196:o:"asthma","food allergies","drug allergies","environmental allergies","stinging insect allergies","eczema","urticaria","contact dermatitis"}. There is no significant infectious history. ***Vaccinations are up to date.    Past Medical History: Patient Active Problem List   Diagnosis Date Noted  . Testicular swelling 08/29/2021  . Seasonal allergic rhinitis 08/29/2021  . Encounter for health-related screening 08/29/2021  . Hx of esophageal varices 10/03/2020  . Hepatic cirrhosis (HCC) 10/03/2020  . Portal hypertensive gastropathy (HCC) 10/03/2020  . Other cirrhosis of liver (HCC) 11/16/2019  . Ascites of liver 11/16/2019  . Chronic hepatitis C without hepatic coma (HCC) 09/23/2019  . Portal hypertension (HCC) 08/06/2019  . Thrombocytopenia (HCC) 08/06/2019  . Inguinal hernia 07/13/2019  . Aortic atherosclerosis (HCC) 07/13/2019  . Alcohol abuse 07/13/2019  . Liver cirrhosis, alcoholic (HCC) 06/24/2019  . Hypertension 06/24/2019    Medication List:  Allergies as of 06/11/2023   No Known Allergies      Medication List        Accurate as of Jun 11, 2023 12:13 PM. If you have any questions, ask your nurse or doctor.          STOP taking these medications    fluticasone  50 MCG/ACT nasal spray Commonly known as: FLONASE  Stopped by: [place] Chuck   nadolol  80 MG  tablet Commonly known as: Corgard  Stopped by: [place] Chuck       TAKE these medications    amLODipine  5 MG tablet Commonly known as: NORVASC  Take 1 tablet (5 mg total) by mouth daily.   atorvastatin  20 MG tablet Commonly known as: LIPITOR Take 1 tablet (20 mg total) by mouth daily.   cetirizine  10 MG tablet Commonly known as: ZYRTEC  Take 1 tablet (10 mg total) by mouth daily.   furosemide  20 MG tablet Commonly known as: LASIX  Take 1 tablet (20 mg total) by mouth daily as needed for fluid or edema (scrotal swelling).   omeprazole  40 MG capsule Commonly known as: PRILOSEC Take 1 capsule (40 mg total) by mouth daily.   spironolactone  25 MG tablet Commonly known as: Aldactone  Take 1 tablet (25 mg total) by mouth daily.        Birth History: {Blank single:19197::"non-contributory","born premature and spent time in the NICU","born at term without complications"}  Developmental History: Richard Yang has met all milestones on time. He has required no {Blank multiple:19196:a:"speech therapy","occupational therapy","physical therapy"}. ***non-contributory  Past Surgical History: Past Surgical History:  Procedure Laterality Date  . INGUINAL HERNIA REPAIR N/A 03/29/2020   Procedure: OPEN UMBILICAL HERNIA REPAIR, BOWEL RESECTION;  Surgeon: Junie Olds, MD;  Location: [facility];  Service: General;  Laterality: N/A;  . IR PARACENTESIS  09/16/2019  . IR PARACENTESIS  10/09/2019  . IR PARACENTESIS  12/09/2019     Family History: Family History  Problem Relation Age of Onset  . Stomach cancer Neg Hx   . Colon cancer Neg Hx   . Esophageal cancer  Neg Hx   . Pancreatic cancer Neg Hx   . Colon polyps Neg Hx   . Rectal cancer Neg Hx      Social History: Demyan lives at home in an apartment.  There is carpeting throughout the apartment.  They have electric heating and fans for cooling.  There are no indoor animals.  There are no dust mite covers on the bed or the pillows.   There is no tobacco exposure.   Review of systems otherwise negative other than that mentioned in the HPI.    Objective:   Blood pressure 130/72, pulse (!) 58, temperature 98 F (36.7 C), temperature source Temporal, resp. rate 18, height 5' 8.5" (1.74 m), weight 223 lb 8 oz (101.4 kg), SpO2 98%. Body mass index is 33.48 kg/m.     Physical Exam   Diagnostic studies: {Blank single:19197::"none","deferred due to recent antihistamine use","deferred due to insurance stipulations that require a separate visit for testing","labs sent instead"," "}  Spirometry: {Blank single:19197::"results normal (FEV1: ***%, FVC: ***%, FEV1/FVC: ***%)","results abnormal (FEV1: ***%, FVC: ***%, FEV1/FVC: ***%)"}.    {Blank single:19197::"Spirometry consistent with mild obstructive disease","Spirometry consistent with moderate obstructive disease","Spirometry consistent with severe obstructive disease","Spirometry consistent with possible restrictive disease","Spirometry consistent with mixed obstructive and restrictive disease","Spirometry uninterpretable due to technique","Spirometry consistent with normal pattern"}. {Blank single:19197::"Albuterol/Atrovent nebulizer","Xopenex/Atrovent nebulizer","Albuterol nebulizer","Albuterol four puffs via MDI","Xopenex four puffs via MDI"} treatment given in clinic with {Blank single:19197::"significant improvement in FEV1 per ATS criteria","significant improvement in FVC per ATS criteria","significant improvement in FEV1 and FVC per ATS criteria","improvement in FEV1, but not significant per ATS criteria","improvement in FVC, but not significant per ATS criteria","improvement in FEV1 and FVC, but not significant per ATS criteria","no improvement"}.  Allergy Studies: {Blank single:19197::"none","deferred due to recent antihistamine use","deferred due to insurance stipulations that require a separate visit for testing","labs sent instead"," "}    {Blank  single:19197::"Allergy testing results were read and interpreted by myself, documented by clinical staff."," "}         Richard Gentles, MD [facility] 

## 2023-06-25 ENCOUNTER — Ambulatory Visit: Admitting: Allergy & Immunology

## 2023-07-08 ENCOUNTER — Other Ambulatory Visit: Payer: Self-pay | Admitting: Critical Care Medicine

## 2023-07-08 ENCOUNTER — Other Ambulatory Visit: Payer: Self-pay

## 2023-07-08 ENCOUNTER — Other Ambulatory Visit: Payer: Self-pay | Admitting: Physician Assistant

## 2023-07-08 DIAGNOSIS — K7030 Alcoholic cirrhosis of liver without ascites: Secondary | ICD-10-CM

## 2023-07-08 DIAGNOSIS — K2970 Gastritis, unspecified, without bleeding: Secondary | ICD-10-CM

## 2023-07-08 NOTE — Patient Instructions (Addendum)
 1. Allergic rhinitis - Skin testing today is positive to grass pollen, tree pollen, and weed pollen with adequate controls.  Intradermal skin testing is positive to [place], [place], and [place] grasses, ragweed mix, dust mite mix, cat hair, and [place] cockroach - Copy of skin test given - Start avoidance measures as below - Stop Zyrtec  (cetirizine ) - Let's try Xyzal (levocetirizine) 5 mg once a day as needed for runny nose. If insurance does not cover this you can buy it over the counter - Continue Singulair (montelukast) 10 mg daily - Singulair can rarely cause irritability, depression, and weird dreams, so beware. If it does stop and call our office. - Continue Ryaltris one spray per nostril daily (this combines a steroid AND a a nasal antihistamine) - Pataday eye drop samples provided at last office visit. Use 1 drop in each eye once a day as needed for itchy watery eyes - Consider allergy injections as a means of long-term control. - Allergy injections "re-train" and "reset" the immune system to ignore environmental allergens and decrease the resulting immune response to those allergens (sneezing, itchy watery eyes, runny nose, nasal congestion, etc).    - Allergy injections improve symptoms in 75-85% of patients.   - We can discuss this more at the next appointment if the medications are not working for you.   2. Follow up in 4-6 weeks or sooner if needed    Reducing Pollen Exposure The American Academy of [employer] suggests the following steps to reduce your exposure to pollen during allergy seasons. Do not hang sheets or clothing out to dry; pollen may collect on these items. Do not mow lawns or spend time around freshly cut grass; mowing stirs up pollen. Keep windows closed at night.  Keep car windows closed while driving. Minimize morning activities outdoors, a time when pollen counts are usually at their highest. Stay indoors as much as possible when pollen  counts or humidity is high and on windy days when pollen tends to remain in the air longer. Use air conditioning when possible.  Many air conditioners have filters that trap the pollen spores. Use a HEPA room air filter to remove pollen form the indoor air you breathe.   Control of Dust Mite Allergen Dust mites play a major role in allergic asthma and rhinitis. They occur in environments with high humidity wherever human skin is found. Dust mites absorb humidity from the atmosphere (ie, they do not drink) and feed on organic matter (including shed human and animal skin). Dust mites are a microscopic type of insect that you cannot see with the naked eye. High levels of dust mites have been detected from mattresses, pillows, carpets, upholstered furniture, bed covers, clothes, soft toys and any woven material. The principal allergen of the dust mite is found in its feces. A gram of dust may contain 1,000 mites and 250,000 fecal particles. Mite antigen is easily measured in the air during house cleaning activities. Dust mites do not bite and do not cause harm to humans, other than by triggering allergies/asthma.  Ways to decrease your exposure to dust mites in your home:  1. Encase mattresses, box springs and pillows with a mite-impermeable barrier or cover  2. Wash sheets, blankets and drapes weekly in hot water (130 F) with detergent and dry them in a dryer on the hot setting.  3. Have the room cleaned frequently with a vacuum cleaner and a damp dust-mop. For carpeting or rugs, vacuuming with a vacuum  cleaner equipped with a high-efficiency particulate air (HEPA) filter. The dust mite allergic individual should not be in a room which is being cleaned and should wait 1 hour after cleaning before going into the room.  4. Do not sleep on upholstered furniture (eg, couches).  5. If possible removing carpeting, upholstered furniture and drapery from the home is ideal. Horizontal blinds should be  eliminated in the rooms where the person spends the most time (bedroom, study, television room). Washable vinyl, roller-type shades are optimal.  6. Remove all non-washable stuffed toys from the bedroom. Wash stuffed toys weekly like sheets and blankets above.  7. Reduce indoor humidity to less than 50%. Inexpensive humidity monitors can be purchased at most hardware stores. Do not use a humidifier as can make the problem worse and are not recommended  Control of Dog or Cat Allergen Avoidance is the best way to manage a dog or cat allergy. If you have a dog or cat and are allergic to dog or cats, consider removing the dog or cat from the home. If you have a dog or cat but don't want to find it a new home, or if your family wants a pet even though someone in the household is allergic, here are some strategies that may help keep symptoms at bay:  Keep the pet out of your bedroom and restrict it to only a few rooms. Be advised that keeping the dog or cat in only one room will not limit the allergens to that room. Don't pet, hug or kiss the dog or cat; if you do, wash your hands with soap and water. High-efficiency particulate air (HEPA) cleaners run continuously in a bedroom or living room can reduce allergen levels over time. Regular use of a high-efficiency vacuum cleaner or a central vacuum can reduce allergen levels. Giving your dog or cat a bath at least once a week can reduce airborne allergen. Control of Cockroach Allergen  Cockroach allergen has been identified as an important cause of acute attacks of asthma, especially in urban settings.  There are fifty-five species of cockroach that exist in the [place] , however only three, the [place], [place] and [place] species produce allergen that can affect patients with Asthma.  Allergens can be obtained from fecal particles, egg casings and secretions from cockroaches.    Remove food sources. Reduce access to water. Seal access and entry  points. Spray runways with 0.5-1% Diazinon or Chlorpyrifos Blow boric acid power under stoves and refrigerator. Place bait stations (hydramethylnon) at feeding sites.  Allergy Shots  Allergies are the result of a chain reaction that starts in the immune system. Your immune system controls how your body defends itself. For instance, if you have an allergy to pollen, your immune system identifies pollen as an invader or allergen. Your immune system overreacts by producing antibodies called Immunoglobulin E (IgE). These antibodies travel to cells that release chemicals, causing an allergic reaction.  The concept behind allergy immunotherapy, whether it is received in the form of shots or tablets, is that the immune system can be desensitized to specific allergens that trigger allergy symptoms. Although it requires time and patience, the payback can be long-term relief. Allergy injections contain a dilute solution of those substances that you are allergic to based upon your skin testing and allergy history.   How Do Allergy Shots Work?  Allergy shots work much like a vaccine. Your body responds to injected amounts of a particular allergen given in increasing doses, eventually developing  a resistance and tolerance to it. Allergy shots can lead to decreased, minimal or no allergy symptoms.  There generally are two phases: build-up and maintenance. Build-up often ranges from three to six months and involves receiving injections with increasing amounts of the allergens. The shots are typically given once or twice a week, though more rapid build-up schedules are sometimes used.  The maintenance phase begins when the most effective dose is reached. This dose is different for each person, depending on how allergic you are and your response to the build-up injections. Once the maintenance dose is reached, there are longer periods between injections, typically two to four weeks.  Occasionally doctors give  cortisone-type shots that can temporarily reduce allergy symptoms. These types of shots are different and should not be confused with allergy immunotherapy shots.  Who Can Be Treated with Allergy Shots?  Allergy shots may be a good treatment approach for people with allergic rhinitis (hay fever), allergic asthma, conjunctivitis (eye allergy) or stinging insect allergy.   Before deciding to begin allergy shots, you should consider:   The length of allergy season and the severity of your symptoms  Whether medications and/or changes to your environment can control your symptoms  Your desire to avoid long-term medication use  Time: allergy immunotherapy requires a major time commitment  Cost: may vary depending on your insurance coverage  Allergy shots for children age 47 and older are effective and often well tolerated. They might prevent the onset of new allergen sensitivities or the progression to asthma.  Allergy shots are not started on patients who are pregnant but can be continued on patients who become pregnant while receiving them. In some patients with other medical conditions or who take certain common medications, allergy shots may be of risk. It is important to mention other medications you talk to your allergist.   What are the two types of build-ups offered:   RUSH or Rapid Desensitization -- one day of injections lasting from 8:30-4:30pm, injections every 1 hour.  Approximately half of the build-up process is completed in that one day.  The following week, normal build-up is resumed, and this entails ~16 visits either weekly or twice weekly, until reaching your "maintenance dose" which is continued weekly until eventually getting spaced out to every month for a duration of 3 to 5 years. The regular build-up appointments are nurse visits where the injections are administered, followed by required monitoring for 30 minutes.    Traditional build-up -- weekly visits for 6 -12 months  until reaching "maintenance dose", then continue weekly until eventually spacing out to every 4 weeks as above. At these appointments, the injections are administered, followed by required monitoring for 30 minutes.     Either way is acceptable, and both are equally effective. With the rush protocol, the advantage is that less time is spent here for injections overall AND you would also reach maintenance dosing faster (which is when the clinical benefit starts to become more apparent). Not everyone is a candidate for rapid desensitization.   IF we proceed with the RUSH protocol, there are premedications which must be taken the day before and the day after the rush only (this includes antihistamines, steroids, and Singulair).  After the rush day, no prednisone  or Singulair is required, and we just recommend antihistamines taken on your injection day.  What Is An Estimate of the Costs?  If you are interested in starting allergy injections, please check with your insurance company about your coverage for  both allergy vial sets and allergy injections.  Please do so prior to making the appointment to start injections.  The following are CPT codes to give to your insurance company. These are the amounts we BILL to the insurance company, but the amount YOU WILL PAY and WE RECEIVE IS SUBSTANTIALLY LESS and depends on the contracts we have with different insurance companies.   Amount Billed to Insurance One allergy vial set  CPT 95165   $ 1200     Two allergy vial set  CPT 95165   $ 2400     Three allergy vial set  CPT 95165   $ 3600     One injection   CPT 95115   $ 35  Two injections   CPT 95117   $ 40 RUSH (Rapid Desensitization) CPT 95180 x 8 hours $500/hour  Regarding the allergy injections, your co-pay may or may not apply with each injection, so please confirm this with your insurance company. When you start allergy injections, 1 or 2 sets of vials are made based on your allergies.  Not all patients  can be on one set of vials. A set of vials lasts 6 months to a year depending on how quickly you can proceed with your build-up of your allergy injections. Vials are personalized for each patient depending on their specific allergens.  How often are allergy injection given during the build-up period?   Injections are given at least weekly during the build-up period until your maintenance dose is achieved. Per the doctor's discretion, you may have the option of getting allergy injections two times per week during the build-up period. However, there must be at least 48 hours between injections. The build-up period is usually completed within 6-12 months depending on your ability to schedule injections and for adjustments for reactions. When maintenance dose is reached, your injection schedule is gradually changed to every two weeks and later to every three weeks. Injections will then continue every 4 weeks. Usually, injections are continued for a total of 3-5 years.   When Will I Feel Better?  Some may experience decreased allergy symptoms during the build-up phase. For others, it may take as long as 12 months on the maintenance dose. If there is no improvement after a year of maintenance, your allergist will discuss other treatment options with you.  If you aren't responding to allergy shots, it may be because there is not enough dose of the allergen in your vaccine or there are missing allergens that were not identified during your allergy testing. Other reasons could be that there are high levels of the allergen in your environment or major exposure to non-allergic triggers like tobacco smoke.  What Is the Length of Treatment?  Once the maintenance dose is reached, allergy shots are generally continued for three to five years. The decision to stop should be discussed with your allergist at that time. Some people may experience a permanent reduction of allergy symptoms. Others may relapse and a longer  course of allergy shots can be considered.  What Are the Possible Reactions?  The two types of adverse reactions that can occur with allergy shots are local and systemic. Common local reactions include very mild redness and swelling at the injection site, which can happen immediately or several hours after. Report a delayed reaction from your last injection. These include arm swelling or runny nose, watery eyes or cough that occurs within 12-24 hours after injection. A systemic reaction, which is less common,  affects the entire body or a particular body system. They are usually mild and typically respond quickly to medications. Signs include increased allergy symptoms such as sneezing, a stuffy nose or hives.   Rarely, a serious systemic reaction called anaphylaxis can develop. Symptoms include swelling in the throat, wheezing, a feeling of tightness in the chest, nausea or dizziness. Most serious systemic reactions develop within 30 minutes of allergy shots. This is why it is strongly recommended you wait in your doctor's office for 30 minutes after your injections. Your allergist is trained to watch for reactions, and his or her staff is trained and equipped with the proper medications to identify and treat them.   Report to the nurse immediately if you experience any of the following symptoms: swelling, itching or redness of the skin, hives, watery eyes/nose, breathing difficulty, excessive sneezing, coughing, stomach pain, diarrhea, or light headedness. These symptoms may occur within 15-20 minutes after injection and may require medication.   Who Should Administer Allergy Shots?  The preferred location for receiving shots is your prescribing allergist's office. Injections can sometimes be given at another facility where the physician and staff are trained to recognize and treat reactions, and have received instructions by your prescribing allergist.  What if I am late for an injection?    Injection dose will be adjusted depending upon how many days or weeks you are late for your injection.   What if I am sick?   Please report any illness to the nurse before receiving injections. She may adjust your dose or postpone injections depending on your symptoms. If you have fever, flu, sinus infection or chest congestion it is best to postpone allergy injections until you are better. Never get an allergy injection if your asthma is causing you problems. If your symptoms persist, seek out medical care to get your health problem under control.  What If I am or Become Pregnant:  Women that become pregnant should schedule an appointment with [facility] before receiving any further allergy injections.

## 2023-07-09 ENCOUNTER — Encounter: Payer: Self-pay | Admitting: Family

## 2023-07-09 ENCOUNTER — Ambulatory Visit (INDEPENDENT_AMBULATORY_CARE_PROVIDER_SITE_OTHER): Admitting: Family

## 2023-07-09 ENCOUNTER — Other Ambulatory Visit: Payer: Self-pay

## 2023-07-09 DIAGNOSIS — J3089 Other allergic rhinitis: Secondary | ICD-10-CM

## 2023-07-09 DIAGNOSIS — J302 Other seasonal allergic rhinitis: Secondary | ICD-10-CM | POA: Diagnosis not present

## 2023-07-09 MED ORDER — OMEPRAZOLE 40 MG PO CPDR
40.0000 mg | DELAYED_RELEASE_CAPSULE | Freq: Every day | ORAL | 0 refills | Status: DC
  Filled 2023-07-09 – 2023-09-03 (×2): qty 90, 90d supply, fill #0

## 2023-07-09 MED ORDER — ATORVASTATIN CALCIUM 20 MG PO TABS
20.0000 mg | ORAL_TABLET | Freq: Every day | ORAL | 3 refills | Status: AC
  Filled 2023-07-09: qty 90, 90d supply, fill #0
  Filled 2023-11-08: qty 90, 90d supply, fill #1
  Filled 2024-02-14: qty 90, 90d supply, fill #2

## 2023-07-09 MED ORDER — LEVOCETIRIZINE DIHYDROCHLORIDE 5 MG PO TABS
5.0000 mg | ORAL_TABLET | Freq: Every evening | ORAL | 5 refills | Status: AC
Start: 2023-07-09 — End: ?
  Filled 2023-07-09: qty 30, 30d supply, fill #0
  Filled 2023-11-08: qty 30, 30d supply, fill #1

## 2023-07-09 NOTE — Progress Notes (Signed)
 Date of Service/Encounter:  07/09/23  Allergy testing appointment   Initial visit on 06/11/23, seen for chronic rhinitis.  Please see that note for additional details.  Today reports for allergy diagnostic testing:    DIAGNOSTICS:  Skin Testing: Environmental allergy panel. Adequate positive and negative controls Results discussed with patient/family.   Airborne Adult Perc - 07/09/23 0844     Time Antigen Placed 0844    Allergen Manufacturer Floyd Hutchinson    Location Back    Number of Test 55    Panel 1 Select    1. Control-Buffer 50% Glycerol Negative    2. Control-Histamine 3+    3. Bahia Negative    4. [place] Negative    5. Johnson Negative    6. [place]  Blue 4+    7. Meadow Fescue 3+    8. Perennial Rye Negative    9. Timothy 3+    10. Ragweed Mix Negative    11. Cocklebur Negative    12. Plantain,  English Negative    13. Baccharis Negative    14. Dog Fennel Negative    15. Russian Thistle Negative    16. Lamb's Quarters Negative    17. Sheep Sorrell Negative    18. Rough Pigweed 2+    19. Marsh Elder, Rough Negative    20. Mugwort, Common 2+    21. Box, Elder 3+    22. Cedar, red Negative    23. Sweet Gum 2+    24. Pecan Pollen 2+    25. Pine Mix Negative    26. Walnut, Black Pollen Negative    27. Red Mulberry 2+    28. Ash Mix Negative    29. Birch Mix 2+    30. Beech American 3+    31. Cottonwood, [place] 2+    32. Hickory, White Negative    33. Maple Mix Negative    34. Oak, [place] Mix 2+    35. Sycamore Eastern 2+    36. Alternaria Alternata Negative    37. Cladosporium Herbarum Negative    38. Aspergillus Mix Negative    39. Penicillium Mix Negative    40. Bipolaris Sorokiniana (Helminthosporium) Negative    41. Drechslera Spicifera (Curvularia) Negative    42. Mucor Plumbeus Negative    43. Fusarium Moniliforme Negative    44. Aureobasidium Pullulans (pullulara) Negative    45. Rhizopus Oryzae Negative    46. Botrytis Cinera Negative     47. Epicoccum Nigrum Negative    48. Phoma Betae Negative    49. Dust Mite Mix Negative    50. Cat Hair 10,000 BAU/ml Negative    51.  Dog Epithelia Negative    52. Mixed Feathers Negative    53. Horse Epithelia Negative    54. Cockroach, German Negative    55. Tobacco Leaf Negative             Intradermal - 07/09/23 0937     Time Antigen Placed 2841    Allergen Manufacturer Floyd Hutchinson    Location Arm    Number of Test 13    Control Negative    Bahia 4+    [place] 4+    Johnson 4+    Ragweed Mix 4+    Mold 1 Negative    Mold 2 Negative    Mold 3 Negative    Mold 4 Negative    Mite Mix 3+    Cat 3+    Dog Negative    Cockroach 2+  Allergy testing results were read and interpreted by myself, documented by clinical staff.  Patient provided with copy of allergy testing along with avoidance measures when indicated.   1. Allergic rhinitis - Skin testing today is positive to grass pollen, tree pollen, and weed pollen with adequate controls.  Intradermal skin testing is positive to [place], [place], and [place] grasses, ragweed mix, dust mite mix, cat hair, and [place] cockroach - Copy of skin test given - Start avoidance measures as below - Stop Zyrtec  (cetirizine ) - Let's try Xyzal (levocetirizine) 5 mg once a day as needed for runny nose. If insurance does not cover this you can buy it over the counter - Continue Singulair (montelukast) 10 mg daily - Singulair can rarely cause irritability, depression, and weird dreams, so beware. If it does stop and call our office. - Continue Ryaltris one spray per nostril daily (this combines a steroid AND a a nasal antihistamine) - Pataday eye drop samples provided at last office visit. Use 1 drop in each eye once a day as needed for itchy watery eyes - Consider allergy injections as a means of long-term control. - Allergy injections "re-train" and "reset" the immune system to ignore environmental allergens and decrease the  resulting immune response to those allergens (sneezing, itchy watery eyes, runny nose, nasal congestion, etc).    - Allergy injections improve symptoms in 75-85% of patients.   - We can discuss this more at the next appointment if the medications are not working for you.   2. Follow up in 4-6 weeks or sooner if needed    Reducing Pollen Exposure The American Academy of Allergy, Asthma and Immunology suggests the following steps to reduce your exposure to pollen during allergy seasons. Do not hang sheets or clothing out to dry; pollen may collect on these items. Do not mow lawns or spend time around freshly cut grass; mowing stirs up pollen. Keep windows closed at night.  Keep car windows closed while driving. Minimize morning activities outdoors, a time when pollen counts are usually at their highest. Stay indoors as much as possible when pollen counts or humidity is high and on windy days when pollen tends to remain in the air longer. Use air conditioning when possible.  Many air conditioners have filters that trap the pollen spores. Use a HEPA room air filter to remove pollen form the indoor air you breathe.   Control of Dust Mite Allergen Dust mites play a major role in allergic asthma and rhinitis. They occur in environments with high humidity wherever human skin is found. Dust mites absorb humidity from the atmosphere (ie, they do not drink) and feed on organic matter (including shed human and animal skin). Dust mites are a microscopic type of insect that you cannot see with the naked eye. High levels of dust mites have been detected from mattresses, pillows, carpets, upholstered furniture, bed covers, clothes, soft toys and any woven material. The principal allergen of the dust mite is found in its feces. A gram of dust may contain 1,000 mites and 250,000 fecal particles. Mite antigen is easily measured in the air during house cleaning activities. Dust mites do not bite and do not cause  harm to humans, other than by triggering allergies/asthma.  Ways to decrease your exposure to dust mites in your home:  1. Encase mattresses, box springs and pillows with a mite-impermeable barrier or cover  2. Wash sheets, blankets and drapes weekly in hot water (130 F) with detergent and dry them  in a dryer on the hot setting.  3. Have the room cleaned frequently with a vacuum cleaner and a damp dust-mop. For carpeting or rugs, vacuuming with a vacuum cleaner equipped with a high-efficiency particulate air (HEPA) filter. The dust mite allergic individual should not be in a room which is being cleaned and should wait 1 hour after cleaning before going into the room.  4. Do not sleep on upholstered furniture (eg, couches).  5. If possible removing carpeting, upholstered furniture and drapery from the home is ideal. Horizontal blinds should be eliminated in the rooms where the person spends the most time (bedroom, study, television room). Washable vinyl, roller-type shades are optimal.  6. Remove all non-washable stuffed toys from the bedroom. Wash stuffed toys weekly like sheets and blankets above.  7. Reduce indoor humidity to less than 50%. Inexpensive humidity monitors can be purchased at most hardware stores. Do not use a humidifier as can make the problem worse and are not recommended  Control of Dog or Cat Allergen Avoidance is the best way to manage a dog or cat allergy. If you have a dog or cat and are allergic to dog or cats, consider removing the dog or cat from the home. If you have a dog or cat but don't want to find it a new home, or if your family wants a pet even though someone in the household is allergic, here are some strategies that may help keep symptoms at bay:  Keep the pet out of your bedroom and restrict it to only a few rooms. Be advised that keeping the dog or cat in only one room will not limit the allergens to that room. Don't pet, hug or kiss the dog or cat; if  you do, wash your hands with soap and water. High-efficiency particulate air (HEPA) cleaners run continuously in a bedroom or living room can reduce allergen levels over time. Regular use of a high-efficiency vacuum cleaner or a central vacuum can reduce allergen levels. Giving your dog or cat a bath at least once a week can reduce airborne allergen. Control of Cockroach Allergen  Cockroach allergen has been identified as an important cause of acute attacks of asthma, especially in urban settings.  There are fifty-five species of cockroach that exist in the [place] , however only three, the [place], [place] and [place] species produce allergen that can affect patients with Asthma.  Allergens can be obtained from fecal particles, egg casings and secretions from cockroaches.    Remove food sources. Reduce access to water. Seal access and entry points. Spray runways with 0.5-1% Diazinon or Chlorpyrifos Blow boric acid power under stoves and refrigerator. Place bait stations (hydramethylnon) at feeding sites.  Allergy Shots  Allergies are the result of a chain reaction that starts in the immune system. Your immune system controls how your body defends itself. For instance, if you have an allergy to pollen, your immune system identifies pollen as an invader or allergen. Your immune system overreacts by producing antibodies called Immunoglobulin E (IgE). These antibodies travel to cells that release chemicals, causing an allergic reaction.  The concept behind allergy immunotherapy, whether it is received in the form of shots or tablets, is that the immune system can be desensitized to specific allergens that trigger allergy symptoms. Although it requires time and patience, the payback can be long-term relief. Allergy injections contain a dilute solution of those substances that you are allergic to based upon your skin testing and allergy history.  How Do Allergy Shots Work?  Allergy shots  work much like a vaccine. Your body responds to injected amounts of a particular allergen given in increasing doses, eventually developing a resistance and tolerance to it. Allergy shots can lead to decreased, minimal or no allergy symptoms.  There generally are two phases: build-up and maintenance. Build-up often ranges from three to six months and involves receiving injections with increasing amounts of the allergens. The shots are typically given once or twice a week, though more rapid build-up schedules are sometimes used.  The maintenance phase begins when the most effective dose is reached. This dose is different for each person, depending on how allergic you are and your response to the build-up injections. Once the maintenance dose is reached, there are longer periods between injections, typically two to four weeks.  Occasionally doctors give cortisone-type shots that can temporarily reduce allergy symptoms. These types of shots are different and should not be confused with allergy immunotherapy shots.  Who Can Be Treated with Allergy Shots?  Allergy shots may be a good treatment approach for people with allergic rhinitis (hay fever), allergic asthma, conjunctivitis (eye allergy) or stinging insect allergy.   Before deciding to begin allergy shots, you should consider:   The length of allergy season and the severity of your symptoms  Whether medications and/or changes to your environment can control your symptoms  Your desire to avoid long-term medication use  Time: allergy immunotherapy requires a major time commitment  Cost: may vary depending on your insurance coverage  Allergy shots for children age 75 and older are effective and often well tolerated. They might prevent the onset of new allergen sensitivities or the progression to asthma.  Allergy shots are not started on patients who are pregnant but can be continued on patients who become pregnant while receiving them. In some  patients with other medical conditions or who take certain common medications, allergy shots may be of risk. It is important to mention other medications you talk to your allergist.   What are the two types of build-ups offered:   RUSH or Rapid Desensitization -- one day of injections lasting from 8:30-4:30pm, injections every 1 hour.  Approximately half of the build-up process is completed in that one day.  The following week, normal build-up is resumed, and this entails ~16 visits either weekly or twice weekly, until reaching your "maintenance dose" which is continued weekly until eventually getting spaced out to every month for a duration of 3 to 5 years. The regular build-up appointments are nurse visits where the injections are administered, followed by required monitoring for 30 minutes.    Traditional build-up -- weekly visits for 6 -12 months until reaching "maintenance dose", then continue weekly until eventually spacing out to every 4 weeks as above. At these appointments, the injections are administered, followed by required monitoring for 30 minutes.     Either way is acceptable, and both are equally effective. With the rush protocol, the advantage is that less time is spent here for injections overall AND you would also reach maintenance dosing faster (which is when the clinical benefit starts to become more apparent). Not everyone is a candidate for rapid desensitization.   IF we proceed with the RUSH protocol, there are premedications which must be taken the day before and the day after the rush only (this includes antihistamines, steroids, and Singulair).  After the rush day, no prednisone  or Singulair is required, and we just recommend antihistamines taken on your  injection day.  What Is An Estimate of the Costs?  If you are interested in starting allergy injections, please check with your insurance company about your coverage for both allergy vial sets and allergy injections.   Please do so prior to making the appointment to start injections.  The following are CPT codes to give to your insurance company. These are the amounts we BILL to the insurance company, but the amount YOU WILL PAY and WE RECEIVE IS SUBSTANTIALLY LESS and depends on the contracts we have with different insurance companies.   Amount Billed to Insurance One allergy vial set  CPT 95165   $ 1200     Two allergy vial set  CPT 95165   $ 2400     Three allergy vial set  CPT 95165   $ 3600     One injection   CPT 95115   $ 35  Two injections   CPT 95117   $ 40 RUSH (Rapid Desensitization) CPT 95180 x 8 hours $500/hour  Regarding the allergy injections, your co-pay may or may not apply with each injection, so please confirm this with your insurance company. When you start allergy injections, 1 or 2 sets of vials are made based on your allergies.  Not all patients can be on one set of vials. A set of vials lasts 6 months to a year depending on how quickly you can proceed with your build-up of your allergy injections. Vials are personalized for each patient depending on their specific allergens.  How often are allergy injection given during the build-up period?   Injections are given at least weekly during the build-up period until your maintenance dose is achieved. Per the doctor's discretion, you may have the option of getting allergy injections two times per week during the build-up period. However, there must be at least 48 hours between injections. The build-up period is usually completed within 6-12 months depending on your ability to schedule injections and for adjustments for reactions. When maintenance dose is reached, your injection schedule is gradually changed to every two weeks and later to every three weeks. Injections will then continue every 4 weeks. Usually, injections are continued for a total of 3-5 years.   When Will I Feel Better?  Some may experience decreased allergy symptoms during the  build-up phase. For others, it may take as long as 12 months on the maintenance dose. If there is no improvement after a year of maintenance, your allergist will discuss other treatment options with you.  If you aren't responding to allergy shots, it may be because there is not enough dose of the allergen in your vaccine or there are missing allergens that were not identified during your allergy testing. Other reasons could be that there are high levels of the allergen in your environment or major exposure to non-allergic triggers like tobacco smoke.  What Is the Length of Treatment?  Once the maintenance dose is reached, allergy shots are generally continued for three to five years. The decision to stop should be discussed with your allergist at that time. Some people may experience a permanent reduction of allergy symptoms. Others may relapse and a longer course of allergy shots can be considered.  What Are the Possible Reactions?  The two types of adverse reactions that can occur with allergy shots are local and systemic. Common local reactions include very mild redness and swelling at the injection site, which can happen immediately or several hours after. Report a delayed reaction  from your last injection. These include arm swelling or runny nose, watery eyes or cough that occurs within 12-24 hours after injection. A systemic reaction, which is less common, affects the entire body or a particular body system. They are usually mild and typically respond quickly to medications. Signs include increased allergy symptoms such as sneezing, a stuffy nose or hives.   Rarely, a serious systemic reaction called anaphylaxis can develop. Symptoms include swelling in the throat, wheezing, a feeling of tightness in the chest, nausea or dizziness. Most serious systemic reactions develop within 30 minutes of allergy shots. This is why it is strongly recommended you wait in your doctor's office for 30 minutes after  your injections. Your allergist is trained to watch for reactions, and his or her staff is trained and equipped with the proper medications to identify and treat them.   Report to the nurse immediately if you experience any of the following symptoms: swelling, itching or redness of the skin, hives, watery eyes/nose, breathing difficulty, excessive sneezing, coughing, stomach pain, diarrhea, or light headedness. These symptoms may occur within 15-20 minutes after injection and may require medication.   Who Should Administer Allergy Shots?  The preferred location for receiving shots is your prescribing allergist's office. Injections can sometimes be given at another facility where the physician and staff are trained to recognize and treat reactions, and have received instructions by your prescribing allergist.  What if I am late for an injection?   Injection dose will be adjusted depending upon how many days or weeks you are late for your injection.   What if I am sick?   Please report any illness to the nurse before receiving injections. She may adjust your dose or postpone injections depending on your symptoms. If you have fever, flu, sinus infection or chest congestion it is best to postpone allergy injections until you are better. Never get an allergy injection if your asthma is causing you problems. If your symptoms persist, seek out medical care to get your health problem under control.  What If I am or Become Pregnant:  Women that become pregnant should schedule an appointment with [facility] before receiving any further allergy injections.  Tinnie Forehand, FNP [facility] 

## 2023-07-09 NOTE — Telephone Encounter (Signed)
 Requested Prescriptions  Pending Prescriptions Disp Refills   nadolol  (CORGARD ) 80 MG tablet 60 tablet     Sig: Take 1 tablet (80 mg total) by mouth daily.     Cardiovascular: Beta Blockers 2 Failed - 07/09/2023 10:22 AM      Failed - Valid encounter within last 6 months    Recent Outpatient Visits           2 months ago Primary hypertension   [facility] [place]. [facility] [place], [place], [place]   1 year ago Primary hypertension   [facility] [facility] [place]. [facility] Vernell Goldsmith, [place]   1 year ago Primary hypertension   [facility] [place]. [facility] Vernell Goldsmith, MD   1 year ago Primary hypertension   [facility] [place]. [facility] Vernell Goldsmith, MD   2 years ago Primary hypertension   [facility] [place]. [facility] Vernell Goldsmith, MD       Future Appointments             In 1 month Tinnie Forehand, FNP [facility] [place] at [facility] - [facility] in normal range and within 360 days    Creat  Date Value Ref Range Status  01/04/2020 1.17 0.70 - 1.25 mg/dL Final    Comment:    For patients >32 years of age, the reference limit for Creatinine is approximately 13% higher for people identified as African-American. .    Creatinine, Ser  Date Value Ref Range Status  04/18/2023 1.22 0.76 - 1.27 mg/dL Final         Passed - Last BP in normal range    BP Readings from Last 1 Encounters:  06/11/23 130/72         Passed - Last Heart Rate in normal range    Pulse Readings from Last 1 Encounters:  06/11/23 (!) 58          omeprazole  (PRILOSEC) 40 MG capsule 90 capsule 0    Sig: Take 1 capsule (40 mg total) by mouth daily.     Gastroenterology: Proton Pump Inhibitors Passed - 07/09/2023 10:22 AM       Passed - Valid encounter within last 12 months    Recent Outpatient Visits           2 months ago Primary hypertension   [facility] [place]. [facility] [place], [place], [place]   1 year ago Primary hypertension   [facility] [place]. [facility] Vernell Goldsmith, MD   1 year ago Primary hypertension   [facility] [facility] [place]. [facility] Vernell Goldsmith, MD   1 year ago Primary hypertension   [facility] [place]. [facility] Vernell Goldsmith, MD   2 years ago Primary hypertension   [facility] [facility] [place] [place] [facility] [place]. [facility] Vernell Goldsmith, MD       Future Appointments  In 1 month Tinnie Forehand, FNP [facility] [place] [facility]

## 2023-07-10 ENCOUNTER — Other Ambulatory Visit: Payer: Self-pay

## 2023-07-11 ENCOUNTER — Other Ambulatory Visit: Payer: Self-pay

## 2023-08-19 NOTE — Patient Instructions (Incomplete)
 1. Allergic rhinitis - Skin testing on 07/09/23 was positive to grass pollen, tree pollen, and weed pollen with adequate controls.  Intradermal skin testing is positive to [place], [place], and [place] grasses, ragweed mix, dust mite mix, cat hair, and [place] cockroach - Continue avoidance measures as below - Continue Xyzal  (levocetirizine) 5 mg once a day as needed for runny nose.  - Continue Singulair (montelukast) 10 mg daily - Singulair can rarely cause irritability, depression, and weird dreams, so beware. If it does stop and call our office. - Continue Ryaltris one spray per nostril daily (this combines a steroid AND a a nasal antihistamine) - Pataday eye drop samples provided at last office visit. Use 1 drop in each eye once a day as needed for itchy watery eyes - Consider allergy  injections as a means of long-term control. - Allergy  injections re-train and reset the immune system to ignore environmental allergens and decrease the resulting immune response to those allergens (sneezing, itchy watery eyes, runny nose, nasal congestion, etc).    - Allergy  injections improve symptoms in 75-85% of patients.   - We can discuss this more at the next appointment if the medications are not working for you.   2. Follow up in months or sooner if needed    Reducing Pollen Exposure The American Academy of [employer] , [employer] suggests the following steps to reduce your exposure to pollen during allergy  seasons. Do not hang sheets or clothing out to dry; pollen may collect on these items. Do not mow lawns or spend time around freshly cut grass; mowing stirs up pollen. Keep windows closed at night.  Keep car windows closed while driving. Minimize morning activities outdoors, a time when pollen counts are usually at their highest. Stay indoors as much as possible when pollen counts or humidity is high and on windy days when pollen tends to remain in the air longer. Use air conditioning  when possible.  Many air conditioners have filters that trap the pollen spores. Use a HEPA room air filter to remove pollen form the indoor air you breathe.   Control of Dust Mite Allergen Dust mites play a major role in allergic asthma and rhinitis. They occur in environments with high humidity wherever human skin is found. Dust mites absorb humidity from the atmosphere (ie, they do not drink) and feed on organic matter (including shed human and animal skin). Dust mites are a microscopic type of insect that you cannot see with the naked eye. High levels of dust mites have been detected from mattresses, pillows, carpets, upholstered furniture, bed covers, clothes, soft toys and any woven material. The principal allergen of the dust mite is found in its feces. A gram of dust may contain 1,000 mites and 250,000 fecal particles. Mite antigen is easily measured in the air during house cleaning activities. Dust mites do not bite and do not cause harm to humans, other than by triggering allergies/asthma.  Ways to decrease your exposure to dust mites in your home:  1. Encase mattresses, box springs and pillows with a mite-impermeable barrier or cover  2. Wash sheets, blankets and drapes weekly in hot water (130 F) with detergent and dry them in a dryer on the hot setting.  3. Have the room cleaned frequently with a vacuum cleaner and a damp dust-mop. For carpeting or rugs, vacuuming with a vacuum cleaner equipped with a high-efficiency particulate air (HEPA) filter. The dust mite allergic individual should not be in a room which is being  cleaned and should wait 1 hour after cleaning before going into the room.  4. Do not sleep on upholstered furniture (eg, couches).  5. If possible removing carpeting, upholstered furniture and drapery from the home is ideal. Horizontal blinds should be eliminated in the rooms where the person spends the most time (bedroom, study, television room). Washable vinyl,  roller-type shades are optimal.  6. Remove all non-washable stuffed toys from the bedroom. Wash stuffed toys weekly like sheets and blankets above.  7. Reduce indoor humidity to less than 50%. Inexpensive humidity monitors can be purchased at most hardware stores. Do not use a humidifier as can make the problem worse and are not recommended  Control of Dog or Cat Allergen Avoidance is the best way to manage a dog or cat allergy . If you have a dog or cat and are allergic to dog or cats, consider removing the dog or cat from the home. If you have a dog or cat but don't want to find it a new home, or if your family wants a pet even though someone in the household is allergic, here are some strategies that may help keep symptoms at bay:  Keep the pet out of your bedroom and restrict it to only a few rooms. Be advised that keeping the dog or cat in only one room will not limit the allergens to that room. Don't pet, hug or kiss the dog or cat; if you do, wash your hands with soap and water. High-efficiency particulate air (HEPA) cleaners run continuously in a bedroom or living room can reduce allergen levels over time. Regular use of a high-efficiency vacuum cleaner or a central vacuum can reduce allergen levels. Giving your dog or cat a bath at least once a week can reduce airborne allergen. Control of Cockroach Allergen  Cockroach allergen has been identified as an important cause of acute attacks of asthma, especially in urban settings.  There are fifty-five species of cockroach that exist in the [place] , however only three, the [place], [place] and [place] species produce allergen that can affect patients with Asthma.  Allergens can be obtained from fecal particles, egg casings and secretions from cockroaches.    Remove food sources. Reduce access to water. Seal access and entry points. Spray runways with 0.5-1% Diazinon or Chlorpyrifos Blow boric acid power under stoves and  refrigerator. Place bait stations (hydramethylnon) at feeding sites.  Allergy  Shots  Allergies are the result of a chain reaction that starts in the immune system. Your immune system controls how your body defends itself. For instance, if you have an allergy  to pollen, your immune system identifies pollen as an invader or allergen. Your immune system overreacts by producing antibodies called Immunoglobulin E (IgE). These antibodies travel to cells that release chemicals, causing an allergic reaction.  The concept behind allergy  immunotherapy, whether it is received in the form of shots or tablets, is that the immune system can be desensitized to specific allergens that trigger allergy  symptoms. Although it requires time and patience, the payback can be long-term relief. Allergy  injections contain a dilute solution of those substances that you are allergic to based upon your skin testing and allergy  history.   How Do Allergy  Shots Work?  Allergy  shots work much like a vaccine. Your body responds to injected amounts of a particular allergen given in increasing doses, eventually developing a resistance and tolerance to it. Allergy  shots can lead to decreased, minimal or no allergy  symptoms.  There generally are two phases:  build-up and maintenance. Build-up often ranges from three to six months and involves receiving injections with increasing amounts of the allergens. The shots are typically given once or twice a week, though more rapid build-up schedules are sometimes used.  The maintenance phase begins when the most effective dose is reached. This dose is different for each person, depending on how allergic you are and your response to the build-up injections. Once the maintenance dose is reached, there are longer periods between injections, typically two to four weeks.  Occasionally doctors give cortisone-type shots that can temporarily reduce allergy  symptoms. These types of shots are different  and should not be confused with allergy  immunotherapy shots.  Who Can Be Treated with Allergy  Shots?  Allergy  shots may be a good treatment approach for people with allergic rhinitis (hay fever), allergic asthma, conjunctivitis (eye allergy ) or stinging insect allergy .   Before deciding to begin allergy  shots, you should consider:   The length of allergy  season and the severity of your symptoms  Whether medications and/or changes to your environment can control your symptoms  Your desire to avoid long-term medication use  Time: allergy  immunotherapy requires a major time commitment  Cost: may vary depending on your insurance coverage  Allergy  shots for children age 46 and older are effective and often well tolerated. They might prevent the onset of new allergen sensitivities or the progression to asthma.  Allergy  shots are not started on patients who are pregnant but can be continued on patients who become pregnant while receiving them. In some patients with other medical conditions or who take certain common medications, allergy  shots may be of risk. It is important to mention other medications you talk to your allergist.   What are the two types of build-ups offered:   RUSH or Rapid Desensitization -- one day of injections lasting from 8:30-4:30pm, injections every 1 hour.  Approximately half of the build-up process is completed in that one day.  The following week, normal build-up is resumed, and this entails ~16 visits either weekly or twice weekly, until reaching your "maintenance dose" which is continued weekly until eventually getting spaced out to every month for a duration of 3 to 5 years. The regular build-up appointments are nurse visits where the injections are administered, followed by required monitoring for 30 minutes.    Traditional build-up -- weekly visits for 6 -12 months until reaching "maintenance dose", then continue weekly until eventually spacing out to every 4 weeks  as above. At these appointments, the injections are administered, followed by required monitoring for 30 minutes.     Either way is acceptable, and both are equally effective. With the rush protocol, the advantage is that less time is spent here for injections overall AND you would also reach maintenance dosing faster (which is when the clinical benefit starts to become more apparent). Not everyone is a candidate for rapid desensitization.   IF we proceed with the RUSH protocol, there are premedications which must be taken the day before and the day after the rush only (this includes antihistamines, steroids, and Singulair).  After the rush day, no prednisone  or Singulair is required, and we just recommend antihistamines taken on your injection day.  What Is An Estimate of the Costs?  If you are interested in starting allergy  injections, please check with your insurance company about your coverage for both allergy  vial sets and allergy  injections.  Please do so prior to making the appointment to start injections.  The following are  CPT codes to give to your insurance company. These are the amounts we BILL to the insurance company, but the amount YOU WILL PAY and WE RECEIVE IS SUBSTANTIALLY LESS and depends on the contracts we have with different insurance companies.   Amount Billed to Insurance One allergy  vial set  CPT 95165   $ 1200     Two allergy  vial set  CPT 95165   $ 2400     Three allergy  vial set  CPT 95165   $ 3600     One injection   CPT 95115   $ 35  Two injections   CPT 95117   $ 40 RUSH (Rapid Desensitization) CPT 95180 x 8 hours $500/hour  Regarding the allergy  injections, your co-pay may or may not apply with each injection, so please confirm this with your insurance company. When you start allergy  injections, 1 or 2 sets of vials are made based on your allergies.  Not all patients can be on one set of vials. A set of vials lasts 6 months to a year depending on how quickly you can  proceed with your build-up of your allergy  injections. Vials are personalized for each patient depending on their specific allergens.  How often are allergy  injection given during the build-up period?   Injections are given at least weekly during the build-up period until your maintenance dose is achieved. Per the doctor's discretion, you may have the option of getting allergy  injections two times per week during the build-up period. However, there must be at least 48 hours between injections. The build-up period is usually completed within 6-12 months depending on your ability to schedule injections and for adjustments for reactions. When maintenance dose is reached, your injection schedule is gradually changed to every two weeks and later to every three weeks. Injections will then continue every 4 weeks. Usually, injections are continued for a total of 3-5 years.   When Will I Feel Better?  Some may experience decreased allergy  symptoms during the build-up phase. For others, it may take as long as 12 months on the maintenance dose. If there is no improvement after a year of maintenance, your allergist will discuss other treatment options with you.  If you aren't responding to allergy  shots, it may be because there is not enough dose of the allergen in your vaccine or there are missing allergens that were not identified during your allergy  testing. Other reasons could be that there are high levels of the allergen in your environment or major exposure to non-allergic triggers like tobacco smoke.  What Is the Length of Treatment?  Once the maintenance dose is reached, allergy  shots are generally continued for three to five years. The decision to stop should be discussed with your allergist at that time. Some people may experience a permanent reduction of allergy  symptoms. Others may relapse and a longer course of allergy  shots can be considered.  What Are the Possible Reactions?  The two types of  adverse reactions that can occur with allergy  shots are local and systemic. Common local reactions include very mild redness and swelling at the injection site, which can happen immediately or several hours after. Report a delayed reaction from your last injection. These include arm swelling or runny nose, watery eyes or cough that occurs within 12-24 hours after injection. A systemic reaction, which is less common, affects the entire body or a particular body system. They are usually mild and typically respond quickly to medications. Signs include increased allergy   symptoms such as sneezing, a stuffy nose or hives.   Rarely, a serious systemic reaction called anaphylaxis can develop. Symptoms include swelling in the throat, wheezing, a feeling of tightness in the chest, nausea or dizziness. Most serious systemic reactions develop within 30 minutes of allergy  shots. This is why it is strongly recommended you wait in your doctor's office for 30 minutes after your injections. Your allergist is trained to watch for reactions, and his or her staff is trained and equipped with the proper medications to identify and treat them.   Report to the nurse immediately if you experience any of the following symptoms: swelling, itching or redness of the skin, hives, watery eyes/nose, breathing difficulty, excessive sneezing, coughing, stomach pain, diarrhea, or light headedness. These symptoms may occur within 15-20 minutes after injection and may require medication.   Who Should Administer Allergy  Shots?  The preferred location for receiving shots is your prescribing allergist's office. Injections can sometimes be given at another facility where the physician and staff are trained to recognize and treat reactions, and have received instructions by your prescribing allergist.  What if I am late for an injection?   Injection dose will be adjusted depending upon how many days or weeks you are late for your injection.    What if I am sick?   Please report any illness to the nurse before receiving injections. She may adjust your dose or postpone injections depending on your symptoms. If you have fever, flu, sinus infection or chest congestion it is best to postpone allergy  injections until you are better. Never get an allergy  injection if your asthma is causing you problems. If your symptoms persist, seek out medical care to get your health problem under control.  What If I am or Become Pregnant:  Women that become pregnant should schedule an appointment with [facility] before receiving any further allergy  injections.

## 2023-08-20 ENCOUNTER — Ambulatory Visit: Admitting: Family

## 2023-09-03 ENCOUNTER — Other Ambulatory Visit: Payer: Self-pay

## 2023-11-08 ENCOUNTER — Other Ambulatory Visit: Payer: Self-pay

## 2023-12-05 ENCOUNTER — Other Ambulatory Visit: Payer: Self-pay | Admitting: Critical Care Medicine

## 2023-12-05 ENCOUNTER — Ambulatory Visit: Admitting: Family Medicine

## 2023-12-05 ENCOUNTER — Other Ambulatory Visit: Payer: Self-pay

## 2023-12-05 DIAGNOSIS — K7031 Alcoholic cirrhosis of liver with ascites: Secondary | ICD-10-CM

## 2023-12-05 DIAGNOSIS — K7030 Alcoholic cirrhosis of liver without ascites: Secondary | ICD-10-CM

## 2023-12-05 DIAGNOSIS — K2970 Gastritis, unspecified, without bleeding: Secondary | ICD-10-CM

## 2023-12-05 MED ORDER — OMEPRAZOLE 40 MG PO CPDR
40.0000 mg | DELAYED_RELEASE_CAPSULE | Freq: Every day | ORAL | 0 refills | Status: DC
Start: 2023-12-05 — End: 2023-12-18
  Filled 2023-12-05: qty 30, 30d supply, fill #0

## 2023-12-05 MED ORDER — SPIRONOLACTONE 25 MG PO TABS
25.0000 mg | ORAL_TABLET | Freq: Every day | ORAL | 0 refills | Status: DC
  Filled 2023-12-05: qty 30, 30d supply, fill #0

## 2023-12-12 ENCOUNTER — Other Ambulatory Visit: Payer: Self-pay

## 2023-12-13 ENCOUNTER — Other Ambulatory Visit: Payer: Self-pay

## 2023-12-18 ENCOUNTER — Ambulatory Visit: Attending: Family Medicine | Admitting: Family Medicine

## 2023-12-18 ENCOUNTER — Other Ambulatory Visit: Payer: Self-pay

## 2023-12-18 ENCOUNTER — Encounter: Payer: Self-pay | Admitting: Family Medicine

## 2023-12-18 VITALS — BP 147/77 | HR 70 | Resp 20 | Ht 68.5 in | Wt 225.4 lb

## 2023-12-18 DIAGNOSIS — I10 Essential (primary) hypertension: Secondary | ICD-10-CM

## 2023-12-18 DIAGNOSIS — Z23 Encounter for immunization: Secondary | ICD-10-CM

## 2023-12-18 DIAGNOSIS — K2970 Gastritis, unspecified, without bleeding: Secondary | ICD-10-CM

## 2023-12-18 DIAGNOSIS — K7031 Alcoholic cirrhosis of liver with ascites: Secondary | ICD-10-CM

## 2023-12-18 DIAGNOSIS — K7030 Alcoholic cirrhosis of liver without ascites: Secondary | ICD-10-CM

## 2023-12-18 DIAGNOSIS — Z125 Encounter for screening for malignant neoplasm of prostate: Secondary | ICD-10-CM

## 2023-12-18 MED ORDER — OMEPRAZOLE 40 MG PO CPDR
40.0000 mg | DELAYED_RELEASE_CAPSULE | Freq: Every day | ORAL | 1 refills | Status: AC
  Filled 2023-12-18 – 2024-02-14 (×2): qty 90, 90d supply, fill #0

## 2023-12-18 MED ORDER — SPIRONOLACTONE 25 MG PO TABS
25.0000 mg | ORAL_TABLET | Freq: Every day | ORAL | 1 refills | Status: AC
  Filled 2023-12-18 – 2024-02-14 (×2): qty 90, 90d supply, fill #0

## 2023-12-18 NOTE — Progress Notes (Signed)
 Subjective:  Patient ID: Richard Yang, male    DOB: April 19, 1955  Age: 68 y.o. MRN: [id number]  CC: Establish Care     Discussed the use of AI scribe software for clinical note transcription with the patient, who gave verbal consent to proceed.  History of Present Illness Richard Yang is a 68 year old male with hypertension, hyperlipidemia, and alcoholic cirrhosis who presents for establishing care. Previously under the care of Dr Brien.  He manages his cirrhosis with Protonix, furosemide  and spironolactone . He has alcoholic cirrhosis without ankle swelling, gastrointestinal bleeding, or vomiting. He is not currently seeing a gastroenterologist and takes omeprazole  for gastrointestinal symptoms. He has not had recent blood work or prostate cancer screening.  Hypertension and Hyperlipidemia are managed with Amlodipine  and Atorvastatin . BP is slightly elevated and he endorses ingesting a high sodium food last night.    Past Medical History:  Diagnosis Date   Alcohol use disorder, moderate, in early remission, dependence (HCC) 11/16/2019   Angio-edema    Ascites    Cirrhosis (HCC)    Elevated AFP    Hepatitis C    Incarcerated umbilical hernia 03/29/2020   Inguinal hernia    Portal hypertension (HCC)    Substance abuse (HCC)    alcohol abuse hx   Umbilical hernia     Past Surgical History:  Procedure Laterality Date   INGUINAL HERNIA REPAIR N/A 03/29/2020   Procedure: OPEN UMBILICAL HERNIA REPAIR, BOWEL RESECTION;  Surgeon: Lyndel Deward PARAS, MD;  Location: [facility];  Service: General;  Laterality: N/A;   IR PARACENTESIS  09/16/2019   IR PARACENTESIS  10/09/2019   IR PARACENTESIS  12/09/2019    Family History  Problem Relation Age of Onset   Stomach cancer Neg Hx    Colon cancer Neg Hx    Esophageal cancer Neg Hx    Pancreatic cancer Neg Hx    Colon polyps Neg Hx    Rectal cancer Neg Hx     Social History   Socioeconomic History   Marital status:  Single    Spouse name: Not on file   Number of children: Not on file   Years of education: Not on file   Highest education level: Not on file  Occupational History   Not on file  Tobacco Use   Smoking status: Never   Smokeless tobacco: Never  Vaping Use   Vaping status: Never Used  Substance and Sexual Activity   Alcohol use: Yes    Alcohol/week: 2.0 standard drinks of alcohol    Types: 2 Glasses of wine per week    Comment: occ   Drug use: Not Currently   Sexual activity: Not Currently    Partners: Male  Other Topics Concern   Not on file  Social History Narrative   Not on file   Social Drivers of Health   Financial Resource Strain: Not on file  Food Insecurity: No Food Insecurity (12/18/2023)   Hunger Vital Sign    Worried About Running Out of Food in the Last Year: Never true    Ran Out of Food in the Last Year: Never true  Transportation Needs: No Transportation Needs (12/18/2023)   PRAPARE - Administrator, Civil Service (Medical): No    Lack of Transportation (Non-Medical): No  Physical Activity: Not on file  Stress: Not on file  Social Connections: Not on file    No Known Allergies  Outpatient Medications Prior to Visit  Medication Sig Dispense  Refill   amLODipine  (NORVASC ) 5 MG tablet Take 1 tablet (5 mg total) by mouth daily. 90 tablet 2   atorvastatin  (LIPITOR) 20 MG tablet Take 1 tablet (20 mg total) by mouth daily. 90 tablet 3   furosemide  (LASIX ) 20 MG tablet Take 1 tablet (20 mg total) by mouth daily as needed for fluid or edema (scrotal swelling). 30 tablet 3   levocetirizine (XYZAL  ALLERGY  24HR) 5 MG tablet Take 1 tablet (5 mg total) by mouth every evening. 30 tablet 5   omeprazole  (PRILOSEC) 40 MG capsule Take 1 capsule (40 mg total) by mouth daily. 30 capsule 0   spironolactone  (ALDACTONE ) 25 MG tablet Take 1 tablet (25 mg total) by mouth daily. 30 tablet 0   No facility-administered medications prior to visit.     ROS Review of  Systems  Constitutional:  Negative for activity change and appetite change.  HENT:  Negative for sinus pressure and sore throat.   Respiratory:  Negative for chest tightness, shortness of breath and wheezing.   Cardiovascular:  Negative for chest pain and palpitations.  Gastrointestinal:  Negative for abdominal distention, abdominal pain and constipation.  Genitourinary: Negative.   Musculoskeletal: Negative.   Psychiatric/Behavioral:  Negative for behavioral problems and dysphoric mood.     Objective:  BP (!) 147/77 (BP Location: Left Arm, Patient Position: Sitting, Cuff Size: Normal)   Pulse 70   Resp 20   Ht 5' 8.5 (1.74 m)   Wt 225 lb 6.4 oz (102.2 kg)   SpO2 97%   BMI 33.77 kg/m      12/18/2023    9:26 AM 12/18/2023    8:52 AM 06/11/2023   10:27 AM  BP/Weight  Systolic BP 147 148 130  Diastolic BP 77 80 72  Wt. (Lbs)  225.4 223.5  BMI  33.77 kg/m2 33.48 kg/m2      Physical Exam Constitutional:      Appearance: He is well-developed.  Cardiovascular:     Rate and Rhythm: Normal rate.     Heart sounds: Normal heart sounds. No murmur heard. Pulmonary:     Effort: Pulmonary effort is normal.     Breath sounds: Normal breath sounds. No wheezing or rales.  Chest:     Chest wall: No tenderness.  Abdominal:     General: Bowel sounds are normal. There is no distension.     Palpations: Abdomen is soft. There is no mass.     Tenderness: There is no abdominal tenderness.  Musculoskeletal:        General: Normal range of motion.     Right lower leg: No edema.     Left lower leg: No edema.  Neurological:     Mental Status: He is alert and oriented to person, place, and time.  Psychiatric:        Mood and Affect: Mood normal.        Latest Ref Rng & Units 04/18/2023    3:07 PM 06/14/2022    1:33 PM 01/03/2022   10:47 AM  CMP  Glucose 70 - 99 mg/dL 92  899  898   BUN 8 - 27 mg/dL 18  13  19    Creatinine 0.76 - 1.27 mg/dL 8.77  8.91  8.70   Sodium 134 - 144 mmol/L  142  142  138   Potassium 3.5 - 5.2 mmol/L 4.6  5.0  4.1   Chloride 96 - 106 mmol/L 107  105  101   CO2 20 - 29 mmol/L  20  20  24    Calcium  8.6 - 10.2 mg/dL 9.3  9.2  9.6   Total Protein 6.0 - 8.5 g/dL 7.3   7.4   Total Bilirubin 0.0 - 1.2 mg/dL 0.4   0.7   Alkaline Phos 44 - 121 IU/L 74   88   AST 0 - 40 IU/L 23   27   ALT 0 - 44 IU/L 23   25     Lipid Panel     Component Value Date/Time   CHOL 146 06/14/2022 1043   TRIG 66 06/14/2022 1043   HDL 79 06/14/2022 1043   CHOLHDL 1.8 06/14/2022 1043   LDLCALC 54 06/14/2022 1043    CBC    Component Value Date/Time   WBC 6.2 06/14/2022 1043   WBC 6.8 02/01/2021 1408   RBC 4.70 06/14/2022 1043   RBC 4.52 02/01/2021 1408   HGB 14.0 06/14/2022 1043   HCT 42.5 06/14/2022 1043   PLT 156 06/14/2022 1043   MCV 90 06/14/2022 1043   MCH 29.8 06/14/2022 1043   MCH 31.4 04/01/2020 0440   MCHC 32.9 06/14/2022 1043   MCHC 33.0 02/01/2021 1408   RDW 13.4 06/14/2022 1043   LYMPHSABS 1.9 06/14/2022 1043   MONOABS 0.4 06/03/2020 1506   EOSABS 0.1 06/14/2022 1043   BASOSABS 0.0 06/14/2022 1043    Lab Results  Component Value Date   HGBA1C 5.5 06/29/2021       Assessment & Plan  Alcoholic cirrhosis of liver Well-managed with no symptoms of ascites, gastrointestinal bleeding, or vomiting. No current GI specialist involvement. - Ordered comprehensive metabolic panel to assess liver function. - Continue Protonix, spironolactone  and furosemide .  Primary hypertension Blood pressure slightly elevated at 148/80, likely due to recent salty and spicy meal. No acute concerns. - Repeated blood pressure measurement before leaving. - Continue current antihypertensive medications. - If BP still remains above goal at next visit I will adjust his regimen -Counseled on blood pressure goal of less than 130/80, low-sodium, DASH diet, medication compliance, 150 minutes of moderate intensity exercise per week. Discussed medication compliance,  adverse effects.   Gastritis Managed with omeprazole . No symptoms of gastrointestinal bleeding or vomiting. - Continue omeprazole .  Encounter for immunization Due for flu vaccination. - Administered flu shot.   Healthcare maintenance Screening for prostate cancer-PSA sent off  Meds ordered this encounter  Medications   omeprazole  (PRILOSEC) 40 MG capsule    Sig: Take 1 capsule (40 mg total) by mouth daily.    Dispense:  90 capsule    Refill:  1   spironolactone  (ALDACTONE ) 25 MG tablet    Sig: Take 1 tablet (25 mg total) by mouth daily.    Dispense:  90 tablet    Refill:  1    Follow-up: No follow-ups on file.       Corrina Sabin, MD, FAAFP. [facility] [place], [place] [phone]   12/18/2023, 10:26 AM

## 2023-12-18 NOTE — Patient Instructions (Signed)
 VISIT SUMMARY:  Today, you came in to establish care. We reviewed your medical history, including your hypertension, hyperlipidemia, and alcoholic cirrhosis. You received a flu shot and we discussed your current medications and any symptoms you might be experiencing.  YOUR PLAN:  -ENCOUNTER FOR IMMUNIZATION: You were due for your flu vaccination, which helps protect you from the influenza virus. We administered the flu shot during your visit.  -ALCOHOLIC CIRRHOSIS OF LIVER: Alcoholic cirrhosis is a condition where the liver is damaged due to long-term alcohol use. Your condition is well-managed with no current symptoms. We ordered a comprehensive metabolic panel to check your liver function and advised you to continue taking spironolactone  and furosemide .  -ESSENTIAL HYPERTENSION: Hypertension, or high blood pressure, is when the force of the blood against your artery walls is too high. Your blood pressure was slightly elevated today, possibly due to a recent spicy meal. We repeated the measurement before you left and advised you to continue your current medications.  -GASTRITIS: Gastritis is the inflammation of the stomach lining. You are managing this condition with omeprazole  and have no symptoms of gastrointestinal bleeding or vomiting. We advised you to continue taking omeprazole .  INSTRUCTIONS:  Please follow up with the lab to complete the comprehensive metabolic panel as soon as possible. Continue taking your current medications as prescribed. If you experience any new symptoms or have concerns, schedule an appointment.

## 2023-12-19 ENCOUNTER — Ambulatory Visit: Payer: Self-pay | Admitting: Family Medicine

## 2023-12-19 DIAGNOSIS — R972 Elevated prostate specific antigen [PSA]: Secondary | ICD-10-CM

## 2023-12-19 LAB — CMP14+EGFR
ALT: 25 IU/L (ref 0–44)
AST: 33 IU/L (ref 0–40)
Albumin: 4.5 g/dL (ref 3.9–4.9)
Alkaline Phosphatase: 69 IU/L (ref 47–123)
BUN/Creatinine Ratio: 11 (ref 10–24)
BUN: 13 mg/dL (ref 8–27)
Bilirubin Total: 0.7 mg/dL (ref 0.0–1.2)
CO2: 22 mmol/L (ref 20–29)
Calcium: 9.6 mg/dL (ref 8.6–10.2)
Chloride: 105 mmol/L (ref 96–106)
Creatinine, Ser: 1.17 mg/dL (ref 0.76–1.27)
Globulin, Total: 3 g/dL (ref 1.5–4.5)
Glucose: 102 mg/dL — ABNORMAL HIGH (ref 70–99)
Potassium: 5 mmol/L (ref 3.5–5.2)
Sodium: 141 mmol/L (ref 134–144)
Total Protein: 7.5 g/dL (ref 6.0–8.5)
eGFR: 67 mL/min/1.73 (ref >59)

## 2023-12-19 LAB — PSA, TOTAL AND FREE
PSA, Free Pct: 34.5 %
PSA, Free: 2.07 ng/mL
Prostate Specific Ag, Serum: 6 ng/mL — ABNORMAL HIGH (ref 0.0–4.0)

## 2023-12-19 LAB — LP+NON-HDL CHOLESTEROL
Cholesterol, Total: 160 mg/dL (ref 100–199)
HDL: 74 mg/dL (ref >39)
LDL Chol Calc (NIH): 70 mg/dL (ref 0–99)
Total Non-HDL-Chol (LDL+VLDL): 86 mg/dL (ref 0–129)
Triglycerides: 88 mg/dL (ref 0–149)
VLDL Cholesterol Cal: 16 mg/dL (ref 5–40)

## 2024-02-14 ENCOUNTER — Other Ambulatory Visit: Payer: Self-pay

## 2024-02-14 MED ORDER — TAMSULOSIN HCL 0.4 MG PO CAPS
0.4000 mg | ORAL_CAPSULE | Freq: Every day | ORAL | 3 refills | Status: AC
  Filled 2024-02-14: qty 30, 30d supply, fill #0

## 2024-02-20 ENCOUNTER — Ambulatory Visit: Payer: Self-pay

## 2024-02-25 ENCOUNTER — Other Ambulatory Visit: Payer: Self-pay
